# Patient Record
Sex: Female | Born: 1985 | Race: Black or African American | Hispanic: No | Smoking: Current every day smoker
Health system: Southern US, Community
[De-identification: ages and names within clinical notes are randomized; demographics above are authoritative.]

## PROBLEM LIST (undated history)

## (undated) ENCOUNTER — Ambulatory Visit (HOSPITAL_COMMUNITY): Discharge status: Against Medical Advice | Payer: Medicaid Other

## (undated) ENCOUNTER — Ambulatory Visit: Admission: EM | Payer: Self-pay

## (undated) DIAGNOSIS — R110 Nausea: Secondary | ICD-10-CM

## (undated) DIAGNOSIS — Z86718 Personal history of other venous thrombosis and embolism: Secondary | ICD-10-CM

## (undated) DIAGNOSIS — Z7901 Long term (current) use of anticoagulants: Secondary | ICD-10-CM

## (undated) DIAGNOSIS — K219 Gastro-esophageal reflux disease without esophagitis: Secondary | ICD-10-CM

## (undated) DIAGNOSIS — Z973 Presence of spectacles and contact lenses: Secondary | ICD-10-CM

## (undated) DIAGNOSIS — I82409 Acute embolism and thrombosis of unspecified deep veins of unspecified lower extremity: Secondary | ICD-10-CM

## (undated) DIAGNOSIS — N939 Abnormal uterine and vaginal bleeding, unspecified: Secondary | ICD-10-CM

## (undated) DIAGNOSIS — F32A Depression, unspecified: Secondary | ICD-10-CM

## (undated) DIAGNOSIS — Z803 Family history of malignant neoplasm of breast: Secondary | ICD-10-CM

## (undated) DIAGNOSIS — I872 Venous insufficiency (chronic) (peripheral): Secondary | ICD-10-CM

## (undated) DIAGNOSIS — F419 Anxiety disorder, unspecified: Secondary | ICD-10-CM

## (undated) DIAGNOSIS — C801 Malignant (primary) neoplasm, unspecified: Secondary | ICD-10-CM

## (undated) DIAGNOSIS — M1990 Unspecified osteoarthritis, unspecified site: Secondary | ICD-10-CM

## (undated) DIAGNOSIS — I739 Peripheral vascular disease, unspecified: Secondary | ICD-10-CM

## (undated) HISTORY — DX: Family history of malignant neoplasm of breast: Z80.3

---
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
## 2003-01-09 ENCOUNTER — Ambulatory Visit: Admission: RE | Admit: 2003-01-09 | Discharge: 2003-01-09 | Payer: Self-pay | Admitting: Obstetrics & Gynecology

## 2003-01-09 ENCOUNTER — Encounter: Payer: Self-pay | Admitting: Obstetrics & Gynecology

## 2003-01-09 ENCOUNTER — Ambulatory Visit (HOSPITAL_COMMUNITY): Admission: RE | Admit: 2003-01-09 | Discharge: 2003-01-09 | Payer: Self-pay | Admitting: Obstetrics & Gynecology

## 2003-02-16 ENCOUNTER — Inpatient Hospital Stay (HOSPITAL_COMMUNITY): Admission: AD | Admit: 2003-02-16 | Discharge: 2003-02-20 | Payer: Self-pay | Admitting: Obstetrics and Gynecology

## 2005-06-10 ENCOUNTER — Emergency Department (HOSPITAL_COMMUNITY): Admission: EM | Admit: 2005-06-10 | Discharge: 2005-06-10 | Payer: Self-pay | Admitting: Emergency Medicine

## 2005-06-16 ENCOUNTER — Emergency Department (HOSPITAL_COMMUNITY): Admission: EM | Admit: 2005-06-16 | Discharge: 2005-06-16 | Payer: Self-pay | Admitting: Emergency Medicine

## 2005-07-08 ENCOUNTER — Emergency Department (HOSPITAL_COMMUNITY): Admission: EM | Admit: 2005-07-08 | Discharge: 2005-07-08 | Payer: Self-pay | Admitting: Emergency Medicine

## 2005-11-24 ENCOUNTER — Emergency Department (HOSPITAL_COMMUNITY): Admission: EM | Admit: 2005-11-24 | Discharge: 2005-11-25 | Payer: Self-pay | Admitting: Emergency Medicine

## 2006-05-28 ENCOUNTER — Ambulatory Visit: Payer: Self-pay | Admitting: Oncology

## 2006-05-28 ENCOUNTER — Ambulatory Visit: Admission: RE | Admit: 2006-05-28 | Discharge: 2006-05-28 | Payer: Self-pay | Admitting: Oncology

## 2006-05-28 ENCOUNTER — Ambulatory Visit: Payer: Self-pay | Admitting: *Deleted

## 2006-05-28 LAB — CBC WITH DIFFERENTIAL/PLATELET
BASO%: 2.1 % — ABNORMAL HIGH (ref 0.0–2.0)
Basophils Absolute: 0.1 10*3/uL (ref 0.0–0.1)
EOS%: 1.6 % (ref 0.0–7.0)
HGB: 12.7 g/dL (ref 11.6–15.9)
MCH: 27 pg (ref 26.0–34.0)
MCHC: 33.7 g/dL (ref 32.0–36.0)
MONO#: 0.4 10*3/uL (ref 0.1–0.9)
RDW: 13.1 % (ref 11.3–14.5)
WBC: 7 10*3/uL (ref 3.9–10.0)
lymph#: 2 10*3/uL (ref 0.9–3.3)

## 2006-05-29 LAB — PROTHROMBIN TIME
INR: 1.1 (ref 0.0–1.5)
Prothrombin Time: 14 seconds (ref 11.6–15.2)

## 2007-07-22 ENCOUNTER — Encounter (INDEPENDENT_AMBULATORY_CARE_PROVIDER_SITE_OTHER): Payer: Self-pay | Admitting: Emergency Medicine

## 2007-07-22 ENCOUNTER — Ambulatory Visit: Payer: Self-pay | Admitting: Surgery

## 2007-07-22 ENCOUNTER — Observation Stay (HOSPITAL_COMMUNITY): Admission: EM | Admit: 2007-07-22 | Discharge: 2007-07-24 | Payer: Self-pay | Admitting: Emergency Medicine

## 2007-07-27 ENCOUNTER — Emergency Department (HOSPITAL_COMMUNITY): Admission: EM | Admit: 2007-07-27 | Discharge: 2007-07-27 | Payer: Self-pay | Admitting: Emergency Medicine

## 2007-08-26 ENCOUNTER — Ambulatory Visit: Payer: Self-pay | Admitting: Oncology

## 2007-09-27 LAB — CBC WITH DIFFERENTIAL/PLATELET
Eosinophils Absolute: 0 10*3/uL (ref 0.0–0.5)
MCV: 81.1 fL (ref 81.0–101.0)
MONO%: 4.9 % (ref 0.0–13.0)
NEUT#: 5.1 10*3/uL (ref 1.5–6.5)
RBC: 4.52 10*6/uL (ref 3.70–5.32)
RDW: 14.6 % — ABNORMAL HIGH (ref 11.3–14.5)
WBC: 7.1 10*3/uL (ref 3.9–10.0)
lymph#: 1.5 10*3/uL (ref 0.9–3.3)

## 2007-09-27 LAB — D-DIMER, QUANTITATIVE: D-Dimer, Quant: 0.78 ug/mL-FEU — ABNORMAL HIGH (ref 0.00–0.48)

## 2007-09-27 LAB — HEPARIN ANTI-XA: Heparin LMW: 0.59 IU/mL

## 2007-11-13 ENCOUNTER — Emergency Department (HOSPITAL_COMMUNITY): Admission: EM | Admit: 2007-11-13 | Discharge: 2007-11-13 | Payer: Self-pay | Admitting: Emergency Medicine

## 2007-11-16 ENCOUNTER — Ambulatory Visit: Payer: Self-pay | Admitting: Oncology

## 2008-03-01 ENCOUNTER — Ambulatory Visit: Payer: Self-pay | Admitting: Obstetrics and Gynecology

## 2008-03-01 ENCOUNTER — Inpatient Hospital Stay (HOSPITAL_COMMUNITY): Admission: AD | Admit: 2008-03-01 | Discharge: 2008-03-03 | Payer: Self-pay | Admitting: Obstetrics & Gynecology

## 2008-03-04 ENCOUNTER — Ambulatory Visit: Payer: Self-pay | Admitting: Oncology

## 2008-03-04 LAB — PROTIME-INR
INR: 1.1 — ABNORMAL LOW (ref 2.00–3.50)
Protime: 13.2 Seconds (ref 10.6–13.4)

## 2008-03-04 LAB — CBC WITH DIFFERENTIAL/PLATELET
Basophils Absolute: 0 10*3/uL (ref 0.0–0.1)
EOS%: 0.9 % (ref 0.0–7.0)
Eosinophils Absolute: 0.1 10*3/uL (ref 0.0–0.5)
HGB: 9.8 g/dL — ABNORMAL LOW (ref 11.6–15.9)
MCH: 27.7 pg (ref 26.0–34.0)
MCV: 81.3 fL (ref 81.0–101.0)
MONO%: 5.4 % (ref 0.0–13.0)
NEUT#: 7.8 10*3/uL — ABNORMAL HIGH (ref 1.5–6.5)
RBC: 3.55 10*6/uL — ABNORMAL LOW (ref 3.70–5.32)
RDW: 15.1 % — ABNORMAL HIGH (ref 11.3–14.5)
lymph#: 1 10*3/uL (ref 0.9–3.3)

## 2008-03-09 LAB — PROTIME-INR
INR: 1.8 — ABNORMAL LOW (ref 2.00–3.50)
Protime: 21.6 Seconds — ABNORMAL HIGH (ref 10.6–13.4)

## 2008-03-16 LAB — CBC WITH DIFFERENTIAL/PLATELET
BASO%: 0.9 % (ref 0.0–2.0)
Basophils Absolute: 0 10*3/uL (ref 0.0–0.1)
EOS%: 2.5 % (ref 0.0–7.0)
MCH: 27 pg (ref 26.0–34.0)
MCHC: 33.3 g/dL (ref 32.0–36.0)
MCV: 81.1 fL (ref 81.0–101.0)
MONO%: 8.3 % (ref 0.0–13.0)
RBC: 4.32 10*6/uL (ref 3.70–5.32)
RDW: 14.7 % — ABNORMAL HIGH (ref 11.3–14.5)
lymph#: 1.3 10*3/uL (ref 0.9–3.3)

## 2008-03-16 LAB — PROTIME-INR
INR: 1.9 — ABNORMAL LOW (ref 2.00–3.50)
Protime: 22.8 Seconds — ABNORMAL HIGH (ref 10.6–13.4)

## 2008-03-23 LAB — PROTIME-INR
INR: 2.3 (ref 2.00–3.50)
Protime: 27.6 Seconds — ABNORMAL HIGH (ref 10.6–13.4)

## 2008-04-13 ENCOUNTER — Ambulatory Visit: Payer: Self-pay | Admitting: Oncology

## 2008-12-11 ENCOUNTER — Ambulatory Visit: Payer: Self-pay | Admitting: Oncology

## 2008-12-29 LAB — CBC WITH DIFFERENTIAL/PLATELET
Basophils Absolute: 0 10*3/uL (ref 0.0–0.1)
Eosinophils Absolute: 0.1 10*3/uL (ref 0.0–0.5)
HGB: 11.4 g/dL — ABNORMAL LOW (ref 11.6–15.9)
MONO#: 0.2 10*3/uL (ref 0.1–0.9)
NEUT#: 1.4 10*3/uL — ABNORMAL LOW (ref 1.5–6.5)
RBC: 4.65 10*6/uL (ref 3.70–5.45)
RDW: 17.7 % — ABNORMAL HIGH (ref 11.2–14.5)
WBC: 3.5 10*3/uL — ABNORMAL LOW (ref 3.9–10.3)
lymph#: 1.7 10*3/uL (ref 0.9–3.3)

## 2009-01-01 LAB — PROTEIN S, ANTIGEN, FREE: Protein S Ag, Free: 68 % normal (ref 50–147)

## 2009-01-01 LAB — LUPUS ANTICOAGULANT PANEL
DRVVT: 35.3 secs (ref 34.7–40.5)
PTT Lupus Anticoagulant: 35.2 secs (ref 32.0–43.4)

## 2009-01-01 LAB — PROTEIN C ACTIVITY: Protein C Activity: 119 % (ref 75–133)

## 2009-04-23 ENCOUNTER — Ambulatory Visit: Payer: Self-pay | Admitting: Oncology

## 2009-05-28 ENCOUNTER — Emergency Department (HOSPITAL_COMMUNITY): Admission: EM | Admit: 2009-05-28 | Discharge: 2009-05-28 | Payer: Self-pay | Admitting: Emergency Medicine

## 2010-06-15 ENCOUNTER — Emergency Department (HOSPITAL_COMMUNITY)
Admission: EM | Admit: 2010-06-15 | Discharge: 2010-06-16 | Disposition: A | Discharge status: Home / Self Care | Payer: Medicaid Other | Attending: Emergency Medicine | Admitting: Emergency Medicine

## 2010-06-15 DIAGNOSIS — R52 Pain, unspecified: Secondary | ICD-10-CM | POA: Insufficient documentation

## 2010-06-15 DIAGNOSIS — Z5320 Procedure and treatment not carried out because of patient's decision for unspecified reasons: Secondary | ICD-10-CM | POA: Insufficient documentation

## 2010-07-05 ENCOUNTER — Emergency Department (HOSPITAL_COMMUNITY): Payer: Medicaid Other

## 2010-07-05 ENCOUNTER — Emergency Department (HOSPITAL_COMMUNITY)
Admission: EM | Admit: 2010-07-05 | Discharge: 2010-07-05 | Disposition: A | Discharge status: Home / Self Care | Payer: Medicaid Other | Attending: Emergency Medicine | Admitting: Emergency Medicine

## 2010-07-05 DIAGNOSIS — M25469 Effusion, unspecified knee: Secondary | ICD-10-CM | POA: Insufficient documentation

## 2010-07-05 DIAGNOSIS — W1789XA Other fall from one level to another, initial encounter: Secondary | ICD-10-CM | POA: Insufficient documentation

## 2010-07-05 DIAGNOSIS — M25569 Pain in unspecified knee: Secondary | ICD-10-CM | POA: Insufficient documentation

## 2010-07-05 DIAGNOSIS — Z86718 Personal history of other venous thrombosis and embolism: Secondary | ICD-10-CM | POA: Insufficient documentation

## 2010-07-07 LAB — RAPID STREP SCREEN (MED CTR MEBANE ONLY): Streptococcus, Group A Screen (Direct): NEGATIVE

## 2010-08-10 ENCOUNTER — Emergency Department (HOSPITAL_COMMUNITY)
Admission: EM | Admit: 2010-08-10 | Discharge: 2010-08-11 | Disposition: A | Discharge status: Home / Self Care | Payer: Medicaid Other | Attending: Emergency Medicine | Admitting: Emergency Medicine

## 2010-08-10 DIAGNOSIS — R079 Chest pain, unspecified: Secondary | ICD-10-CM | POA: Insufficient documentation

## 2010-08-10 DIAGNOSIS — Z86718 Personal history of other venous thrombosis and embolism: Secondary | ICD-10-CM | POA: Insufficient documentation

## 2010-08-10 DIAGNOSIS — Y92410 Unspecified street and highway as the place of occurrence of the external cause: Secondary | ICD-10-CM | POA: Insufficient documentation

## 2010-08-10 DIAGNOSIS — S139XXA Sprain of joints and ligaments of unspecified parts of neck, initial encounter: Secondary | ICD-10-CM | POA: Insufficient documentation

## 2010-08-10 DIAGNOSIS — T148XXA Other injury of unspecified body region, initial encounter: Secondary | ICD-10-CM | POA: Insufficient documentation

## 2010-08-11 ENCOUNTER — Emergency Department (HOSPITAL_COMMUNITY): Payer: Medicaid Other

## 2010-08-30 NOTE — Discharge Summary (Signed)
NAMECHARRIE, Monica Holland              ACCOUNT NO.:  [id number]   MEDICAL RECORD NO.:  [id number]          PATIENT TYPE:  OBV   LOCATION:  5121                         FACILITY:  [facility]   PHYSICIAN:  Herbie Saxon, MDDATE OF BIRTH:  December 14, 1985   DATE OF ADMISSION:  07/22/2007  DATE OF DISCHARGE:  07/24/2007                               DISCHARGE SUMMARY   DISCHARGE DIAGNOSES:  1. Deep vein thrombosis, left leg.  2. Pregnancy, first trimester.   RADIOLOGY:  Venous Doppler of the left leg on July 22, 2007, which  showed evidence of DVT in the calf vein extending to the popliteal vein  and femoral vein.   HOSPITAL COURSE:  This is a 25 year old African American lady with 2  previous deliveries.  She had DVT in the first trimester of her last  pregnancy, presented to the emergency room complaining of swelling and  pain in the left calf.  The patient had a 5-hour trip by bus from [place] prior to this.  The patient has also tobacco abuse.  At  presently, she was found to be pregnant in first trimester 5-[redacted] weeks  gestation.  The patient has been started on Lovenox.  She has no medical  insurance, and she has been set up to follow up at [facility].  The  patient's medications might be procuring an abortion; however for now,  she is started on Lovenox for possible Coumadin treatment, if she is  going with an abortion.  This is to be followed up [place] by the outpatient  physician at [facility].   DISCHARGE CONDITION:  Stable.   DISPOSITION:  Home with [facility] to monitor compliance with  clinic visit and anticoagulant treatment.  The patient to report to  emergency room if any bleeding episodes.  Followup with [facility] in  the next 3-5 days.  Followup with OB/GYN in next 1-2 weeks, and possibly  repeat left leg venous Doppler in next 5-6 weeks.   PHYSICAL EXAMINATION:  She is a young lady, obese, not in acute  respiratory distress.  Temperature 98, pulse 74,  respiratory rate is 18,  and blood pressure 180/70.  Pupils equal and reactive to light and  accommodation.  Oropharynx and nasopharynx were clear.  Head is  atraumatic, normocephalic.  There is no submandibular lymphadenopathy.  Neck is supple.  Heart sounds S1 and S2.  Regular rate and rhythm.  No  murmurs.  Chest is clear clinically.  Abdomen is benign.  She is alert  and oriented to time, place, and person.  Peripheral pulse is present.  No pedal edema.  Homans sign negative.   Homocysteine level 3.5.  Chemistry shows the sodium of 136, potassium  3.7, chloride 105, bicarbonate 24, glucose 95, BUN 3, and creatinine  0.6.  WBC 7, hematocrit 40, and platelet count 197.  D-dimer 19.  Total  cholesterol 95, HDL 32, LDL 53, and TSH of 3.19.   Need for compliance with clinical medications explained.  The patient  verbalizes understanding.      Herbie Saxon, MD  Electronically Signed  MIO/MEDQ  D:  07/24/2007  T:  07/25/2007  Job:  [id number]

## 2010-08-30 NOTE — H&P (Signed)
Monica Holland, Monica Holland              ACCOUNT NO.:  [id number]   MEDICAL RECORD NO.:  [id number]          PATIENT TYPE:  EMS   LOCATION:  MAJO                         FACILITY:  [facility]   PHYSICIAN:  Beckey Rutter, MD  DATE OF BIRTH:  [birth year]   DATE OF ADMISSION:  07/22/2007  DATE OF DISCHARGE:                              HISTORY & PHYSICAL   PRIMARY CARE PHYSICIAN:  Unassigned.   CHIEF COMPLAINT:  Leg swelling.   HISTORY OF PRESENT ILLNESS:  This is a 25 year old African American  female with past medical history significant for DVT came in today  because of left calf swelling.  The patient noticed the swelling after  she finished a trip from [place] by bus about 4 to 5 hours  driving.  The patient had pain in the calf area, but she denied fevers.  The patient had similar swelling 2 years ago during her pregnancy, was  diagnosed as deep vein thrombosis and she was kept on anticoagulation  for some time.  She was told by a physician last year that the clot was  gone after Doppler was done to verify the status of that clot.  Currently she is not sure if she is pregnant, but she denied  contraceptive pill.  The patient was a smoker up to a few days ago when  she stated she quit smoking.   PAST MEDICAL HISTORY:  Significant for deep vein thrombosis about 2  years ago.   SOCIAL HISTORY:  Smoker up to 1 week ago.  Lives with her mother.  Has  two kids.  No drug abuse.  Occasional drinker.   FAMILY HISTORY:  Noncontributory.   ALLERGIES:  Not known to have medication allergies.   MEDICATION:  1. Motrin p.r.n.  2. Tylenol p.r.n.   REVIEW OF SYSTEMS:  A 12 point review of systems is noncontributory.   PHYSICAL EXAMINATION:  VITAL SIGNS:  Temperature is 98.0, blood pressure  116/74, pulse 80, respiratory rate is 22.  HEENT:  Head atraumatic, normocephalic.  Eyes PERRL.  Mouth moist.  No  ulcer.  NECK:  Supple.  No JVD.  PRECORDIUM:  First and second heart sound  audible.  No added sounds.  LUNGS:  Bilateral fair air entry.  ABDOMEN:  Soft, nontender.  Bowel sounds present.  EXTREMITIES: Left calf swelling and warm to palpation.  The left calf is  also tender to palpation with shiny skin in that area.  NEUROLOGICALLY:  Alert and oriented x3.  Moving all her extremities  spontaneously.   LABORATORY DATA:  D-dimer is 19.4.  PTT 28.  PT is 1.1.  Sodium is 139,  potassium 3.8, chloride 104, glucose 100, BUN 6, creatinine 0.9.  White  blood count 7.9, hemoglobin is 14.1, hematocrit is 40.0, platelet count  is 197.   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  The DVT seems to be provoked by the long travel.  Nevertheless, I will send for hypercoagulable state because of the fact  that the patient had it twice.  Will check her pregnancy status  at this  time as well.  The patient will be started on Lovenox and we will switch  to Coumadin after the result of pregnancy test.  For GI prophylaxis I  will start Protonix.      Beckey Rutter, MD  Electronically Signed     EME/MEDQ  D:  07/22/2007  T:  07/23/2007  Job:  [id number]

## 2010-12-12 ENCOUNTER — Emergency Department (HOSPITAL_COMMUNITY)
Admission: EM | Admit: 2010-12-12 | Discharge: 2010-12-12 | Disposition: A | Discharge status: Home / Self Care | Payer: Medicaid Other | Attending: Emergency Medicine | Admitting: Emergency Medicine

## 2010-12-12 DIAGNOSIS — R220 Localized swelling, mass and lump, head: Secondary | ICD-10-CM | POA: Insufficient documentation

## 2010-12-12 DIAGNOSIS — W2203XA Walked into furniture, initial encounter: Secondary | ICD-10-CM | POA: Insufficient documentation

## 2010-12-12 DIAGNOSIS — R221 Localized swelling, mass and lump, neck: Secondary | ICD-10-CM | POA: Insufficient documentation

## 2010-12-12 DIAGNOSIS — S0003XA Contusion of scalp, initial encounter: Secondary | ICD-10-CM | POA: Insufficient documentation

## 2011-01-10 LAB — BETA-2-GLYCOPROTEIN I ABS, IGG/M/A
Beta-2 Glyco I IgG: 5 U/mL (ref <20)
Beta-2-Glycoprotein I IgM: <4 U/mL (ref <10)

## 2011-01-10 LAB — POCT I-STAT, CHEM 8
BUN: 4 — ABNORMAL LOW
Creatinine, Ser: 0.8
Creatinine, Ser: 0.9
Glucose, Bld: 100 — ABNORMAL HIGH
HCT: 42
Hemoglobin: 14.3
Potassium: 4
Sodium: 136
TCO2: 25

## 2011-01-10 LAB — URINALYSIS, ROUTINE W REFLEX MICROSCOPIC
Bilirubin Urine: NEGATIVE
Nitrite: NEGATIVE
Specific Gravity, Urine: 1.023
Urobilinogen, UA: 1

## 2011-01-10 LAB — LUPUS ANTICOAGULANT PANEL
DRVVT: 50.9 — ABNORMAL HIGH (ref 36.1–47.0)
Lupus Anticoagulant: NOT DETECTED
PTTLA 4:1 Mix: 54.9 — ABNORMAL HIGH (ref 36.3–48.8)
PTTLA Confirmation: 0 (ref <8.0)
dRVVT Incubated 1:1 Mix: 41.1 (ref 36.1–47.0)

## 2011-01-10 LAB — CBC
Hemoglobin: 13
MCV: 82.4
Platelets: 197
RBC: 4.69
RDW: 13.6
WBC: 5.9

## 2011-01-10 LAB — LIPID PANEL
Cholesterol: 95
HDL: 32 — ABNORMAL LOW
LDL Cholesterol: 53
Total CHOL/HDL Ratio: 3
Triglycerides: 49

## 2011-01-10 LAB — WET PREP, GENITAL

## 2011-01-10 LAB — DIFFERENTIAL
Basophils Absolute: 0
Lymphocytes Relative: 19
Lymphs Abs: 1.3
Monocytes Relative: 6
Neutro Abs: 4.1
Neutro Abs: 5.9
Neutrophils Relative %: 70

## 2011-01-10 LAB — COMPREHENSIVE METABOLIC PANEL
ALT: 20
AST: 16
Alkaline Phosphatase: 49
Calcium: 8.8
GFR calc Af Amer: >60
Glucose, Bld: 95
Potassium: 3.7
Sodium: 136
Total Protein: 5.7 — ABNORMAL LOW

## 2011-01-10 LAB — POCT PREGNANCY, URINE
Operator id: 26520
Preg Test, Ur: POSITIVE

## 2011-01-10 LAB — CARDIOLIPIN ANTIBODIES, IGG, IGM, IGA
Anticardiolipin IgG: <7 — ABNORMAL LOW (ref <11)
Anticardiolipin IgM: <7 — ABNORMAL LOW (ref <10)

## 2011-01-10 LAB — APTT: aPTT: 28

## 2011-01-10 LAB — URINE MICROSCOPIC-ADD ON

## 2011-01-10 LAB — RAPID URINE DRUG SCREEN, HOSP PERFORMED
Amphetamines: NOT DETECTED
Benzodiazepines: NOT DETECTED

## 2011-01-10 LAB — TSH: TSH: 3.191

## 2011-01-10 LAB — HCG, QUANTITATIVE, PREGNANCY: hCG, Beta Chain, Quant, S: 59778 — ABNORMAL HIGH

## 2011-01-17 LAB — STREP B DNA PROBE

## 2011-01-17 LAB — CBC
HCT: 27.7 — ABNORMAL LOW
Hemoglobin: 11.2 — ABNORMAL LOW
Hemoglobin: 9.4 — ABNORMAL LOW
RBC: 3.3 — ABNORMAL LOW
RBC: 3.99
RDW: 14.9
WBC: 11.7 — ABNORMAL HIGH

## 2011-01-17 LAB — RAPID URINE DRUG SCREEN, HOSP PERFORMED
Amphetamines: NOT DETECTED
Barbiturates: NOT DETECTED
Opiates: NOT DETECTED

## 2011-01-17 LAB — URINALYSIS, ROUTINE W REFLEX MICROSCOPIC
Bilirubin Urine: NEGATIVE
Ketones, ur: NEGATIVE
Nitrite: NEGATIVE
pH: 7

## 2011-01-17 LAB — APTT: aPTT: 31

## 2011-01-17 LAB — RUBELLA SCREEN: Rubella: 28.5 — ABNORMAL HIGH

## 2011-01-17 LAB — URINE MICROSCOPIC-ADD ON

## 2011-01-17 LAB — ABO/RH: ABO/RH(D): B POS

## 2011-01-17 LAB — TYPE AND SCREEN: ABO/RH(D): B POS

## 2011-01-17 LAB — PROTIME-INR
INR: 1.2
Prothrombin Time: 14.7
Prothrombin Time: 16 — ABNORMAL HIGH

## 2011-01-17 LAB — CREATININE, SERUM: GFR calc non Af Amer: >60

## 2011-01-17 LAB — WET PREP, GENITAL: Clue Cells Wet Prep HPF POC: NONE SEEN

## 2011-01-17 LAB — RPR: RPR Ser Ql: NONREACTIVE

## 2012-04-17 DIAGNOSIS — Z86711 Personal history of pulmonary embolism: Secondary | ICD-10-CM

## 2012-04-17 HISTORY — PX: ANKLE SURGERY: SHX546

## 2012-04-17 HISTORY — DX: Personal history of pulmonary embolism: Z86.711

## 2012-10-20 ENCOUNTER — Encounter (HOSPITAL_COMMUNITY): Payer: Self-pay | Admitting: *Deleted

## 2012-10-20 ENCOUNTER — Emergency Department (HOSPITAL_COMMUNITY)
Admission: EM | Admit: 2012-10-20 | Discharge: 2012-10-21 | Disposition: A | Discharge status: Home / Self Care | Payer: Medicaid Other | Attending: Emergency Medicine | Admitting: Emergency Medicine

## 2012-10-20 DIAGNOSIS — I82402 Acute embolism and thrombosis of unspecified deep veins of left lower extremity: Secondary | ICD-10-CM

## 2012-10-20 DIAGNOSIS — F17200 Nicotine dependence, unspecified, uncomplicated: Secondary | ICD-10-CM | POA: Insufficient documentation

## 2012-10-20 DIAGNOSIS — I82409 Acute embolism and thrombosis of unspecified deep veins of unspecified lower extremity: Secondary | ICD-10-CM | POA: Insufficient documentation

## 2012-10-20 DIAGNOSIS — I739 Peripheral vascular disease, unspecified: Secondary | ICD-10-CM | POA: Insufficient documentation

## 2012-10-20 HISTORY — DX: Acute embolism and thrombosis of unspecified deep veins of unspecified lower extremity: I82.409

## 2012-10-20 MED ORDER — OXYCODONE-ACETAMINOPHEN 5-325 MG PO TABS
1.0000 | ORAL_TABLET | Freq: Once | ORAL | Status: AC
  Administered 2012-10-20: 1 via ORAL
  Filled 2012-10-20: qty 1

## 2012-10-20 MED ORDER — ENOXAPARIN SODIUM 80 MG/0.8ML ~~LOC~~ SOLN
75.0000 mg | Freq: Once | SUBCUTANEOUS | Status: AC
  Administered 2012-10-20: 75 mg via SUBCUTANEOUS
  Filled 2012-10-20: qty 0.8

## 2012-10-20 NOTE — ED Notes (Signed)
Pt states she has extensive history of DVT. Pt has not been anticoagulated x 6 months w/ known DVT. Pt states worsening sxs of swelling and pain x 2 weeks. Pt denies CP and SoB at this time. Pt is in no acute distress.

## 2012-10-20 NOTE — ED Notes (Signed)
Patient is alert and oriented x3.  She is complaining of left leg pain due to swelling.  Patient has a history of DVTs and has not been seeing any PCP for this due to her moving from [facility] to [place].

## 2012-10-20 NOTE — ED Provider Notes (Signed)
   History    CSN: [id number] Arrival date & time 10/20/12  2100  First MD Initiated Contact with Patient 10/20/12 2227     Chief Complaint  Patient presents with  . Leg Swelling  . Claudication   (Consider location/radiation/quality/duration/timing/severity/associated sxs/prior Treatment) The history is provided by the patient.  Para Monica Holland is a 27 y.o. female hx of DVT uncompliant with coumadin here with L leg swelling. The swelling over the last 2 weeks. She came here from [place] 2 months ago and hasn't seen a doctor yet. She decided to stop taking her Coumadin 6 months ago. Never has history of PE and denies any chest pain or shortness of breath.    Past Medical History  Diagnosis Date  . DVT (deep venous thrombosis)    History reviewed. No pertinent past surgical history. History reviewed. No pertinent family history. History  Substance Use Topics  . Smoking status: Current Every Day Smoker    Types: Cigarettes  . Smokeless tobacco: Not on file  . Alcohol Use: Yes     Comment: weekends (a lot)   OB History   Grav Para Term Preterm Abortions TAB SAB Ect Mult Living                 Review of Systems  Cardiovascular: Positive for leg swelling.  All other systems reviewed and are negative.    Allergies  Review of patient's allergies indicates no known allergies.  Home Medications  No current outpatient prescriptions on file. BP 122/74  Pulse 93  Temp(Src) 98.8 F (37.1 C) (Oral)  Resp 16  Ht 5\' 11"  (1.803 m)  Wt 165 lb (74.844 kg)  BMI 23.02 kg/m2  SpO2 100%  LMP 10/13/2012 Physical Exam  Nursing note and vitals reviewed. Constitutional: She is oriented to person, place, and time. She appears well-developed and well-nourished.  HENT:  Head: Normocephalic.  Mouth/Throat: Oropharynx is clear and moist.  Eyes: Conjunctivae are normal. Pupils are equal, round, and reactive to light.  Neck: Normal range of motion. Neck supple.  Cardiovascular:  Normal rate, regular rhythm and normal heart sounds.   Pulmonary/Chest: Effort normal and breath sounds normal.  Abdominal: Soft. Bowel sounds are normal. She exhibits no distension. There is no tenderness. There is no rebound and no guarding.  Musculoskeletal:  L leg swollen, + calf tenderness, 2+ pulses   Neurological: She is alert and oriented to person, place, and time.  Skin: Skin is warm and dry.  Psychiatric: She has a normal mood and affect. Her behavior is normal. Judgment and thought content normal.    ED Course  Procedures (including critical care time) Labs Reviewed - No data to display No results found. No diagnosis found.  MDM  Monica Holland is a 27 y.o. female here with L leg swelling with known DVT not on coumadin. She likely has another L leg DVT. No vascular US overnight. Will give pain meds and lovenox. Will d/c home on lovenox and then start coumadin in several days. She said that she can f/u with a doctor to get INR check.    Richardean Canal, MD 10/21/12 [phone]

## 2012-10-21 MED ORDER — WARFARIN SODIUM 5 MG PO TABS: 10.0000 mg | ORAL_TABLET | Freq: Every day | ORAL | Status: DC

## 2012-10-21 MED ORDER — ENOXAPARIN SODIUM 100 MG/ML ~~LOC~~ SOLN: 1.0000 mg/kg | Freq: Two times a day (BID) | SUBCUTANEOUS | Status: DC

## 2012-10-21 MED ORDER — OXYCODONE-ACETAMINOPHEN 5-325 MG PO TABS: 2.0000 | ORAL_TABLET | ORAL | Status: DC | PRN

## 2012-10-21 NOTE — ED Notes (Signed)
Patient is alert and oriented x3.  She was given DC instructions and follow up visit instructions.  Patient gave verbal understanding. She was DC ambulatory under her own power to home.  V/S stable.  He was not showing any signs of distress on DC 

## 2013-03-04 ENCOUNTER — Emergency Department (HOSPITAL_COMMUNITY)
Admission: EM | Admit: 2013-03-04 | Discharge: 2013-03-05 | Disposition: A | Discharge status: Home / Self Care | Payer: Medicaid Other | Attending: Emergency Medicine | Admitting: Emergency Medicine

## 2013-03-04 ENCOUNTER — Encounter (HOSPITAL_COMMUNITY): Payer: Self-pay | Admitting: Emergency Medicine

## 2013-03-04 DIAGNOSIS — F17200 Nicotine dependence, unspecified, uncomplicated: Secondary | ICD-10-CM | POA: Insufficient documentation

## 2013-03-04 DIAGNOSIS — R0602 Shortness of breath: Secondary | ICD-10-CM | POA: Insufficient documentation

## 2013-03-04 DIAGNOSIS — Z9119 Patient's noncompliance with other medical treatment and regimen: Secondary | ICD-10-CM | POA: Insufficient documentation

## 2013-03-04 DIAGNOSIS — Z9114 Patient's other noncompliance with medication regimen: Secondary | ICD-10-CM

## 2013-03-04 DIAGNOSIS — I2699 Other pulmonary embolism without acute cor pulmonale: Secondary | ICD-10-CM | POA: Insufficient documentation

## 2013-03-04 DIAGNOSIS — I82402 Acute embolism and thrombosis of unspecified deep veins of left lower extremity: Secondary | ICD-10-CM

## 2013-03-04 DIAGNOSIS — Z3202 Encounter for pregnancy test, result negative: Secondary | ICD-10-CM | POA: Insufficient documentation

## 2013-03-04 DIAGNOSIS — Z7901 Long term (current) use of anticoagulants: Secondary | ICD-10-CM | POA: Insufficient documentation

## 2013-03-04 DIAGNOSIS — I82409 Acute embolism and thrombosis of unspecified deep veins of unspecified lower extremity: Secondary | ICD-10-CM | POA: Insufficient documentation

## 2013-03-04 DIAGNOSIS — Z91199 Patient's noncompliance with other medical treatment and regimen due to unspecified reason: Secondary | ICD-10-CM | POA: Insufficient documentation

## 2013-03-04 NOTE — ED Notes (Signed)
Nanavati, MD at bedside. 

## 2013-03-04 NOTE — ED Notes (Signed)
Pt states that she has a hx of blood clots, most recently in July. Pt admits that the reason she keeps getting blood clots is due to non-compliance with medication regimen.

## 2013-03-04 NOTE — ED Notes (Addendum)
Pt. reports left lower leg pain with mild swelling for several days , pt. stated history of DVT , denies injury , ambulatory , respirations unlabored .

## 2013-03-05 ENCOUNTER — Emergency Department (HOSPITAL_COMMUNITY): Payer: Medicaid Other

## 2013-03-05 ENCOUNTER — Encounter (HOSPITAL_COMMUNITY): Payer: Self-pay | Admitting: Radiology

## 2013-03-05 LAB — POCT I-STAT, CHEM 8
HCT: 43 % (ref 36.0–46.0)
Hemoglobin: 14.6 g/dL (ref 12.0–15.0)
Sodium: 143 mEq/L (ref 135–145)
TCO2: 24 mmol/L (ref 0–100)

## 2013-03-05 LAB — POCT PREGNANCY, URINE: Preg Test, Ur: NEGATIVE

## 2013-03-05 MED ORDER — RIVAROXABAN (XARELTO) EDUCATION KIT FOR DVT/PE PATIENTS
PACK | Freq: Once | Status: AC
  Administered 2013-03-05: 04:00:00
  Filled 2013-03-05: qty 1

## 2013-03-05 MED ORDER — IOHEXOL 350 MG/ML SOLN
100.0000 mL | Freq: Once | INTRAVENOUS | Status: AC | PRN
  Administered 2013-03-05: 100 mL via INTRAVENOUS

## 2013-03-05 MED ORDER — ACETAMINOPHEN-CODEINE #3 300-30 MG PO TABS: 1.0000 | ORAL_TABLET | Freq: Four times a day (QID) | ORAL | Status: DC | PRN

## 2013-03-05 MED ORDER — RIVAROXABAN 15 MG PO TABS
15.0000 mg | ORAL_TABLET | Freq: Two times a day (BID) | ORAL | Status: DC
  Administered 2013-03-05: 15 mg via ORAL
  Filled 2013-03-05: qty 1

## 2013-03-05 MED ORDER — RIVAROXABAN (XARELTO) VTE STARTER PACK (15 & 20 MG): ORAL_TABLET | ORAL | Status: DC

## 2013-03-05 MED ORDER — SODIUM CHLORIDE 0.9 % IV BOLUS (SEPSIS)
1000.0000 mL | Freq: Once | INTRAVENOUS | Status: AC
  Administered 2013-03-05: 1000 mL via INTRAVENOUS

## 2013-03-05 MED ORDER — ENOXAPARIN SODIUM 100 MG/ML ~~LOC~~ SOLN: 0.5000 mg/kg | Freq: Once | SUBCUTANEOUS | Status: DC

## 2013-03-05 NOTE — ED Provider Notes (Addendum)
CSN: [id number]     Arrival date & time 03/04/13  2316 History   First MD Initiated Contact with Patient 03/04/13 2354     Chief Complaint  Patient presents with  . Leg Pain   (Consider location/radiation/quality/duration/timing/severity/associated sxs/prior Treatment) HPI Comments: Pt comes in with cc of leg pain, left sided. Pt has hx of DVT, and admits to non compliance. States last took Lovenox in August. Pt has increased pain, and some swelling to the LLE. Also states that she gets short of breath now, with just a little exertion. No orthopnea, no PND, no PE hx and never checked for that.   Patient is a 27 y.o. female presenting with leg pain. The history is provided by the patient.  Leg Pain Associated symptoms: no neck pain     Past Medical History  Diagnosis Date  . DVT (deep venous thrombosis)    History reviewed. No pertinent past surgical history. No family history on file. History  Substance Use Topics  . Smoking status: Current Every Day Smoker    Types: Cigarettes  . Smokeless tobacco: Not on file  . Alcohol Use: Yes     Comment: weekends (a lot)   OB History   Grav Para Term Preterm Abortions TAB SAB Ect Mult Living                 Review of Systems  Constitutional: Positive for activity change.  Respiratory: Positive for shortness of breath.   Cardiovascular: Negative for chest pain.  Gastrointestinal: Negative for nausea, vomiting and abdominal pain.  Genitourinary: Negative for dysuria.  Musculoskeletal: Negative for neck pain.  Neurological: Negative for headaches.  Hematological: Does not bruise/bleed easily.    Allergies  Review of patient's allergies indicates no known allergies.  Home Medications   Current Outpatient Rx  Name  Route  Sig  Dispense  Refill  . enoxaparin (LOVENOX) 100 MG/ML injection   Subcutaneous   Inject 0.75 mLs (75 mg total) into the skin every 12 (twelve) hours.   20 mL   0   . oxyCODONE-acetaminophen (PERCOCET)  5-325 MG per tablet   Oral   Take 2 tablets by mouth every 4 (four) hours as needed for pain.   10 tablet   0   . warfarin (COUMADIN) 5 MG tablet   Oral   Take 2 tablets (10 mg total) by mouth daily. 10mg  once daily x 3 days then per your doctor   20 tablet   0     Dispense as written.    BP 130/78  Pulse 93  Temp(Src) 98.1 F (36.7 C) (Oral)  Resp 20  Wt 168 lb 2 oz (76.261 kg)  SpO2 100%  LMP 02/09/2013 Physical Exam  Nursing note and vitals reviewed. Constitutional: She is oriented to person, place, and time. She appears well-developed and well-nourished.  HENT:  Head: Normocephalic and atraumatic.  Eyes: EOM are normal. Pupils are equal, round, and reactive to light.  Neck: Neck supple. No JVD present.  Cardiovascular: Normal rate, regular rhythm and normal heart sounds.   No murmur heard. Pulmonary/Chest: Effort normal. No respiratory distress.  Abdominal: Soft. She exhibits no distension. There is no tenderness. There is no rebound and no guarding.  Musculoskeletal: She exhibits edema and tenderness.  LLE edema and tenderness  Neurological: She is alert and oriented to person, place, and time.  Skin: Skin is warm and dry.    ED Course  Procedures (including critical care time) Labs Review Labs Reviewed -  No data to display Imaging Review No results found.  EKG Interpretation    Date/Time:  Wednesday March 05 2013 01:16:49 EST Ventricular Rate:  70 PR Interval:  174 QRS Duration: 108 QT Interval:  393 QTC Calculation: 424 R Axis:   93 Text Interpretation:  Sinus rhythm Borderline right axis deviation Probable left ventricular hypertrophy Confirmed by Jovanka Westgate, MD, Sergio Hobart ([id number]) on 03/05/2013 1:26:23 AM            MDM  No diagnosis found.  Pt with hx of DVT, non compliance comes in with cc of leg pain and some shortness of breath. Will have to get a CT PE, she is at moderate risk per Wells score, and not a good candidate for dimer.  If the  CT PE is negative, we will discharge her with xarelto and give her dose of Lovenox here. I dont see any utility in US duplex at this time. There is no evidence of cerulea dolens and finding a DVT would lead to the same therapy - anticoagulation.  She has medicaid, and i will give her PCP follow up. She can get outpatient study done if needed, and vascular consultation at that time if there is DVT and need for filter.  Pt explained all of this, and understands the plan, addressed all her questions.    [name], MD 03/05/13 1610  [name], MD 03/05/13 0126  3:19 AM Has a very small PE. Non obstructive. Spoke with the hospitalist on call, dr. patel and dr. Conley Rolls and consensus is that this is a treatment non compliance and not failure, and with such a small PE, there is no need for admission, or even evaluation for ivc filter. Will d.c Return precautions and need for compliance discussed. Xarelto 30 day free voucher given. Has medicaid. [facility] follow up provided.   [name], MD 03/05/13 [phone]

## 2013-03-05 NOTE — Progress Notes (Signed)
Xarelto patient education completed and provided patient with educational brochure including card for first 30 days of treatment.

## 2013-03-05 NOTE — ED Notes (Signed)
Pharmacist at bedside doing pt education on Xarelto. Pt given information packet as well.

## 2013-03-05 NOTE — ED Notes (Signed)
Spoke with pharmacist. Pharmacist states that the reason the pt's Lovenox has not been sent is because he is waiting on the verification of the dose needed.

## 2013-03-10 ENCOUNTER — Emergency Department (HOSPITAL_COMMUNITY): Payer: Medicaid Other

## 2013-03-10 ENCOUNTER — Encounter (HOSPITAL_COMMUNITY): Payer: Self-pay | Admitting: Emergency Medicine

## 2013-03-10 ENCOUNTER — Emergency Department (HOSPITAL_COMMUNITY)
Admission: EM | Admit: 2013-03-10 | Discharge: 2013-03-10 | Disposition: A | Discharge status: Home / Self Care | Payer: Medicaid Other | Attending: Emergency Medicine | Admitting: Emergency Medicine

## 2013-03-10 DIAGNOSIS — R0602 Shortness of breath: Secondary | ICD-10-CM | POA: Insufficient documentation

## 2013-03-10 DIAGNOSIS — Z86711 Personal history of pulmonary embolism: Secondary | ICD-10-CM | POA: Insufficient documentation

## 2013-03-10 DIAGNOSIS — F17200 Nicotine dependence, unspecified, uncomplicated: Secondary | ICD-10-CM | POA: Insufficient documentation

## 2013-03-10 DIAGNOSIS — Z86718 Personal history of other venous thrombosis and embolism: Secondary | ICD-10-CM | POA: Insufficient documentation

## 2013-03-10 DIAGNOSIS — R05 Cough: Secondary | ICD-10-CM | POA: Insufficient documentation

## 2013-03-10 DIAGNOSIS — Z7901 Long term (current) use of anticoagulants: Secondary | ICD-10-CM | POA: Insufficient documentation

## 2013-03-10 DIAGNOSIS — R059 Cough, unspecified: Secondary | ICD-10-CM | POA: Insufficient documentation

## 2013-03-10 LAB — CBC WITH DIFFERENTIAL/PLATELET
Basophils Absolute: 0 10*3/uL (ref 0.0–0.1)
Eosinophils Relative: 2 % (ref 0–5)
Lymphocytes Relative: 39 % (ref 12–46)
Neutro Abs: 3.5 10*3/uL (ref 1.7–7.7)
Platelets: 202 10*3/uL (ref 150–400)
RDW: 16.4 % — ABNORMAL HIGH (ref 11.5–15.5)
WBC: 6.5 10*3/uL (ref 4.0–10.5)

## 2013-03-10 LAB — BASIC METABOLIC PANEL
CO2: 25 mEq/L (ref 19–32)
Calcium: 9.2 mg/dL (ref 8.4–10.5)
Chloride: 106 mEq/L (ref 96–112)
Sodium: 139 mEq/L (ref 135–145)

## 2013-03-10 LAB — POCT I-STAT TROPONIN I: Troponin i, poc: 0 ng/mL (ref 0.00–0.08)

## 2013-03-10 NOTE — ED Notes (Signed)
Pt states she went to [facility] last week and was told she had a PE.  Pt is on xeralto at this time.  Pt states she has a hx of blood clots x 10 years.  Reports that she became more SOB last night which prompted her to represent.  Pt states she has pain in b/l rib cage radiating to back, pain is 8/10 sharp throbbing in nature.

## 2013-03-10 NOTE — ED Provider Notes (Signed)
CSN: [id number]     Arrival date & time 03/10/13  0612 History   First MD Initiated Contact with Patient 03/10/13 0615     Chief Complaint  Patient presents with  . Shortness of Breath   (Consider location/radiation/quality/duration/timing/severity/associated sxs/prior Treatment) The history is provided by the patient and medical records.   This is a 27 y.o. F with PMH significant for recurrent LLE DVT presenting to the ED for SOB.  Pt was seen in the ED on 03/05/13 and found to have a tiny, non-obstructing RLL PE without heart strain.  Pt was placed on xarelto and states she has been complaint with this medications.  Pt began having bilateral pain in her ribs and SOB which concerned her.  States she has been intermittently lightheaded over the past few days.  sto new cold sx-- non-productive cough, sore throat, chills.  No hemoptysis.  Denies any syncopal events.  Denies any fever.  VS stable on arrival-- no hypoxia, no tachycardia.  Past Medical History  Diagnosis Date  . DVT (deep venous thrombosis)    No past surgical history on file. No family history on file. History  Substance Use Topics  . Smoking status: Current Every Day Smoker    Types: Cigarettes  . Smokeless tobacco: Not on file  . Alcohol Use: Yes     Comment: weekends (a lot)   OB History   Grav Para Term Preterm Abortions TAB SAB Ect Mult Living                 Review of Systems  Respiratory: Positive for cough and shortness of breath.   Cardiovascular: Positive for chest pain.  All other systems reviewed and are negative.    Allergies  Review of patient's allergies indicates no known allergies.  Home Medications   Current Outpatient Rx  Name  Route  Sig  Dispense  Refill  . acetaminophen-codeine (TYLENOL #3) 300-30 MG per tablet   Oral   Take 1-2 tablets by mouth every 6 (six) hours as needed.   15 tablet   0   . Rivaroxaban 15 & 20 MG TBPK      Take as directed on package: Start with one 15mg   tablet by mouth twice a day with food. On Day 22, switch to one 20mg  tablet once a day with food.   51 each   0    BP 124/90  Pulse 96  Temp(Src) 98.3 F (36.8 C) (Oral)  Resp 20  Ht 5\' 11"  (1.803 m)  Wt 168 lb (76.204 kg)  BMI 23.44 kg/m2  SpO2 100%  LMP 02/09/2013  Physical Exam  Nursing note and vitals reviewed. Constitutional: She is oriented to person, place, and time. She appears well-developed and well-nourished. No distress.  HENT:  Head: Normocephalic and atraumatic.  Right Ear: Tympanic membrane and ear canal normal.  Left Ear: Tympanic membrane and ear canal normal.  Nose: Nose normal.  Mouth/Throat: Uvula is midline, oropharynx is clear and moist and mucous membranes are normal. No oropharyngeal exudate, posterior oropharyngeal edema, posterior oropharyngeal erythema or tonsillar abscesses.  Eyes: Conjunctivae and EOM are normal. Pupils are equal, round, and reactive to light.  Neck: Normal range of motion. Neck supple.  Cardiovascular: Normal rate, regular rhythm and normal heart sounds.   Pulmonary/Chest: Effort normal and breath sounds normal. No accessory muscle usage. Not tachypneic. No respiratory distress. She has no wheezes. She has no rhonchi.  Lungs CTAB  Abdominal: Soft. Bowel sounds are normal. There is  no tenderness. There is no guarding.  Musculoskeletal: Normal range of motion.  No calf asymmetry, TTP, or palpable cord; negative Homan's sign; strong distal pulse; sensation intact  Neurological: She is alert and oriented to person, place, and time.  Skin: Skin is warm and dry. She is not diaphoretic.  Psychiatric: She has a normal mood and affect.    ED Course  Procedures (including critical care time) Labs Review Labs Reviewed  CBC WITH DIFFERENTIAL - Abnormal; Notable for the following:    RBC 5.22 (*)    RDW 16.4 (*)    All other components within normal limits  BASIC METABOLIC PANEL - Abnormal; Notable for the following:    BUN 4 (*)     GFR calc non Af Amer 76 (*)    GFR calc Af Amer 88 (*)    All other components within normal limits  POCT I-STAT TROPONIN I   Imaging Review Dg Chest 2 View  03/10/2013   CLINICAL DATA:  Bilateral chest pain, cough, shortness of breath, runny nose, history of smoking  EXAM: CHEST  2 VIEW  COMPARISON:  08/11/2010; 07/27/2007; chest CT- 03/05/2013  FINDINGS: Grossly unchanged cardiac cardiac silhouette and mediastinal contours. The lungs remain hyperexpanded with mild diffuse slightly nodular thickening of the pulmonary interstitium. No new focal airspace opacities. No pleural effusion or pneumothorax. No evidence of edema. No acute osseus abnormalities.  IMPRESSION: Mild lung hyperexpansion and bronchitic change without acute cardiopulmonary disease.   Electronically Signed   By: Simonne Come M.D.   On: 03/10/2013 07:18    EKG Interpretation    Date/Time:  Monday March 10 2013 06:25:05 EST Ventricular Rate:  97 PR Interval:  179 QRS Duration: 98 QT Interval:  368 QTC Calculation: 467 R Axis:   85 Text Interpretation:  Sinus rhythm No significant change since last tracing Confirmed by KNAPP  MD-J, JON ([id number]) on 03/10/2013 6:29:49 AM            MDM   1. Shortness of breath    Patient with known small, nonobstructing pulmonary embolus in right lower lobe, currently on Xarelto and endorses compliance with his medication.  Will obtain basic work-up and reassess.  EKG NSR, no acute ischemic changes.  Trop negative.  CXR without acute disease.  Labs as above.  VS have remained stable while in the ED without noted hypoxia or tachycardia.  Intermittent lightheadedness possibly due to new cold sx.  Pt afebrile, non-toxic appearing, NAD, VS stable- ok for discharge.  Instructed to continue taking Xarelto as previously instructed.  Pt has previously scheduled FU with [facility] in early December.  Discussed plan with pt, she agreed.  Return precautions advised.  Discussed with  Dr. Lynelle Doctor who agrees with assessment and plan of care.  Garlon Hatchet, PA-C 03/10/13 [phone]

## 2013-03-10 NOTE — ED Notes (Signed)
PA at bedside.

## 2013-03-11 NOTE — ED Provider Notes (Signed)
Medical screening examination/treatment/procedure(s) were performed by non-physician practitioner and as supervising physician I was immediately available for consultation/collaboration.  EKG Interpretation    Date/Time:  Monday March 10 2013 06:25:05 EST Ventricular Rate:  97 PR Interval:  179 QRS Duration: 98 QT Interval:  368 QTC Calculation: 467 R Axis:   85 Text Interpretation:  Sinus rhythm No significant change since last tracing Confirmed by Mahealani Sulak  MD-J, Moris Ratchford ([id number]) on 03/10/2013 6:29:49 AM              Celene Kras, MD 03/11/13 [phone]

## 2013-03-28 ENCOUNTER — Ambulatory Visit: Payer: Medicaid Other | Admitting: Internal Medicine

## 2014-04-19 ENCOUNTER — Encounter (HOSPITAL_COMMUNITY): Payer: Self-pay | Admitting: Emergency Medicine

## 2014-04-19 ENCOUNTER — Emergency Department (HOSPITAL_COMMUNITY)
Admission: EM | Admit: 2014-04-19 | Discharge: 2014-04-20 | Disposition: A | Discharge status: Home / Self Care | Payer: No Typology Code available for payment source | Attending: Emergency Medicine | Admitting: Emergency Medicine

## 2014-04-19 DIAGNOSIS — S8992XA Unspecified injury of left lower leg, initial encounter: Secondary | ICD-10-CM | POA: Diagnosis not present

## 2014-04-19 DIAGNOSIS — Z7901 Long term (current) use of anticoagulants: Secondary | ICD-10-CM | POA: Diagnosis not present

## 2014-04-19 DIAGNOSIS — Y998 Other external cause status: Secondary | ICD-10-CM | POA: Diagnosis not present

## 2014-04-19 DIAGNOSIS — S3992XA Unspecified injury of lower back, initial encounter: Secondary | ICD-10-CM | POA: Diagnosis present

## 2014-04-19 DIAGNOSIS — Z760 Encounter for issue of repeat prescription: Secondary | ICD-10-CM | POA: Insufficient documentation

## 2014-04-19 DIAGNOSIS — I82512 Chronic embolism and thrombosis of left femoral vein: Secondary | ICD-10-CM | POA: Diagnosis not present

## 2014-04-19 DIAGNOSIS — Y92410 Unspecified street and highway as the place of occurrence of the external cause: Secondary | ICD-10-CM | POA: Insufficient documentation

## 2014-04-19 DIAGNOSIS — M545 Low back pain, unspecified: Secondary | ICD-10-CM

## 2014-04-19 DIAGNOSIS — Z720 Tobacco use: Secondary | ICD-10-CM | POA: Insufficient documentation

## 2014-04-19 DIAGNOSIS — R0602 Shortness of breath: Secondary | ICD-10-CM | POA: Diagnosis not present

## 2014-04-19 DIAGNOSIS — Y9389 Activity, other specified: Secondary | ICD-10-CM | POA: Diagnosis not present

## 2014-04-19 DIAGNOSIS — Z79899 Other long term (current) drug therapy: Secondary | ICD-10-CM | POA: Insufficient documentation

## 2014-04-19 LAB — PROTIME-INR
INR: 1 (ref 0.00–1.49)
PROTHROMBIN TIME: 13.3 s (ref 11.6–15.2)

## 2014-04-19 LAB — I-STAT CHEM 8, ED
BUN: 8 mg/dL (ref 6–23)
CHLORIDE: 103 meq/L (ref 96–112)
Calcium, Ion: 1.22 mmol/L (ref 1.12–1.23)
Creatinine, Ser: 0.9 mg/dL (ref 0.50–1.10)
Glucose, Bld: 122 mg/dL — ABNORMAL HIGH (ref 70–99)
HEMATOCRIT: 44 % (ref 36.0–46.0)
Hemoglobin: 15 g/dL (ref 12.0–15.0)
POTASSIUM: 4.1 mmol/L (ref 3.5–5.1)
SODIUM: 141 mmol/L (ref 135–145)
TCO2: 24 mmol/L (ref 0–100)

## 2014-04-19 MED ORDER — WARFARIN - PHYSICIAN DOSING INPATIENT: Freq: Every day | Status: DC

## 2014-04-19 MED ORDER — WARFARIN SODIUM 7.5 MG PO TABS: 7.5000 mg | ORAL_TABLET | ORAL | Status: DC

## 2014-04-19 MED ORDER — CYCLOBENZAPRINE HCL 5 MG PO TABS
5.0000 mg | ORAL_TABLET | Freq: Three times a day (TID) | ORAL | Status: DC | PRN
Start: 2014-04-19 — End: 2020-11-11

## 2014-04-19 MED ORDER — WARFARIN SODIUM 7.5 MG PO TABS
7.5000 mg | ORAL_TABLET | Freq: Once | ORAL | Status: AC
  Administered 2014-04-20: 7.5 mg via ORAL
  Filled 2014-04-19 (×2): qty 1

## 2014-04-19 MED ORDER — CYCLOBENZAPRINE HCL 10 MG PO TABS
5.0000 mg | ORAL_TABLET | Freq: Once | ORAL | Status: AC
  Administered 2014-04-19: 5 mg via ORAL
  Filled 2014-04-19: qty 1

## 2014-04-19 NOTE — Discharge Instructions (Signed)
°Emergency Department Resource Guide °1) Find a Doctor and Pay Out of Pocket °Although you won't have to find out who is covered by your insurance plan, it is a good idea to ask around and get recommendations. You will then need to call the office and see if the doctor you have chosen will accept you as a new patient and what types of options they offer for patients who are self-pay. Some doctors offer discounts or will set up payment plans for their patients who do not have insurance, but you will need to ask so you aren't surprised when you get to your appointment. ° °2) Contact Your Local Health Department °Not all health departments have doctors that can see patients for sick visits, but many do, so it is worth a call to see if yours does. If you don't know where your local health department is, you can check in your phone book. The CDC also has a tool to help you locate your state's health department, and many state websites also have listings of all of their local health departments. ° °3) Find a Walk-in Clinic °If your illness is not likely to be very severe or complicated, you may want to try a walk in clinic. These are popping up all over the country in pharmacies, drugstores, and shopping centers. They're usually staffed by nurse practitioners or physician assistants that have been trained to treat common illnesses and complaints. They're usually fairly quick and inexpensive. However, if you have serious medical issues or chronic medical problems, these are probably not your best option. ° °No Primary Care Doctor: °- Call [facility] at  832-8000 - they can help you locate a primary care doctor that  accepts your insurance, provides certain services, etc. °- Physician Referral Service- 1-800-533-3463 ° °Chronic Pain Problems: °Organization         Address  Phone   Notes  °[facility]  (336) 297-2271 Patients need to be referred by their primary care doctor.  ° °Medication  Assistance: °Organization         Address  Phone   Notes  °[place] [facility] [address] °[place], [place] [address] (336) 641-8030 --Must be a resident of [place] °-- Must have NO insurance coverage whatsoever (no Medicaid/ Medicare, etc.) °-- The pt. MUST have a primary care doctor that directs their care regularly and follows them in the community °  °[facility]  (866) 331-1348   °[employer]  (888) 892-1162   ° °Agencies that provide inexpensive medical care: °Organization         Address  Phone   Notes  °[facility]  (336) 832-8035   °[facility]    (336) 832-7272   °[facility] [address] °[place], [place] [address] (336) 832-4777   °[facility] [address], °[place] (336) 271-4999   °[facility]    (336) 373-0678   °[facility]    (336) 272-1050   °[facility] ° [address], [place] Phone:  (336) 832-4444, Fax:  (336) 832-4440 Hours of Operation:  9 am - 6 pm, M-F.  Also accepts Medicaid/Medicare and self-pay.  °[facility] ° [address], [place] Phone: (336) 832-3150, Fax: (336) 832-3151. Hours of Operation:  8:30 am - 5:30 pm, M-F.  Also accepts Medicaid and self-pay.  °[facility] [place] [address], [place] Phone: (336) 878-6027   °[facility] [address], [place], [place] (336)723-1848, Ext. 123 Mondays & Thursdays: 7-9 AM.  First 15 patients are seen on a first come, first serve basis. °  ° °Medicaid-accepting [place] Providers: ° °Organization         Address  Phone   Notes  °[facility] [address], [place] (336) 641-2100 Also accepts self-pay patients.  °[facility] [address], [place] ° (336) 856-9996   °[facility] [address], [place]  (336) 288-8857   °[facility] [address] [place] [address], [place] (336) 299-7000   °Veita Bland [address], [place]  ° (336) 373-1557 Only accepts [place] Access Medicaid patients after they have their name applied to their card.  ° °Self-Pay (no insurance) in [place]: ° °Organization         Address  Phone   Notes  °Sickle Cell Patients, [facility] [address], [place] (336) 832-1970   °[facility] [address], [place] (336) 832-4400   °[facility] [place] ° [address], [place] (336) 992-4800   °[facility]/Dr. Osei-Bonsu ° [address] [place] [address], [place] or [address], [place] (336) 841-8500 Phone number for both [place] and [place] locations is the same.  °[facility] [address], [place] (336) 299-0000   °[facility] [address] [place] [address], [place] or [address] (336) 852-7530 °(336) 878-2260   °[facility] [address], [place] (336) 350-1642, phone; (336) 294-5005, fax Sees patients 1st and 3rd Saturday of every month.  Must not qualify for public or private insurance (i.e. Medicaid, Medicare, [place] Health Choice, Veterans' Benefits) • Household income should be no more than 200% of the poverty level •The clinic cannot treat you if you are pregnant or think you are pregnant • Sexually transmitted diseases are not treated at the clinic.  ° ° °Dental Care: °Organization         Address  Phone  Notes  °[place] [facility] [address], [place] (336) 641-6152 Accepts children up to age 21 who are enrolled in Medicaid or [place] Health Choice; pregnant women with a Medicaid card; and children who have applied for Medicaid or [place] Health Choice, but were declined, whose parents can pay a reduced fee at time of service.  °[place]  [facility] [place]  [address], [place] (336) 641-7733 Accepts children up to age 21 who are enrolled in Medicaid or [place] Health Choice; pregnant women with a Medicaid card; and children who have applied for Medicaid or [place] Health Choice, but were declined, whose parents can pay a reduced fee at time of service.  °[facility] ° [address],  (336) 641-4533 Patients are seen by appointment only. Walk-ins are not accepted. [facility] will see patients 18 years of age and older. °Monday - Tuesday (8am-5pm) °Most Wednesdays (8:30-5pm) °$30 per visit, cash only  °[facility] ° [address], [place] (336) 641-4533 Patients are seen by appointment only. Walk-ins are not accepted. [facility] will see patients 18 years of age and older. °One   Wednesday Evening (Monthly: Volunteer Based).  $30 per visit, cash only  °[facility]  (919) 537-3737 for adults; Children under age 4, call Graduate Pediatric Dentistry at (919) [phone]. Children aged 4-14, please call (919) 537-3737 to request a pediatric application. ° Dental services are provided in all areas of dental care including fillings, crowns and bridges, complete and partial dentures, implants, gum treatment, root canals, and extractions. Preventive care is also provided. Treatment is provided to both adults and children. °Patients are selected via a lottery and there is often a waiting list. °  °[facility] [address], °[place] ° (336) 763-8833 [email or web address] °  °[facility] [address], [place], [place] (336)723-1848, Ext. 123 Second and Fourth Thursday of each month, opens at 6:30 AM; Clinic ends at 9 AM.  Patients are seen on a first-come first-served basis, and a limited number are seen during each clinic.  ° °[facility] ° [address], [place], [place] (336) 723-7904    Eligibility Requirements °You must have lived in [place], [place], or [place] counties for at least the last three months. °  You cannot be eligible for state or federal sponsored healthcare insurance, including Veterans Administration, Medicaid, or Medicare. °  You generally cannot be eligible for healthcare insurance through your employer.  °  How to apply: °Eligibility screenings are held every Tuesday and Wednesday afternoon from 1:00 pm until 4:00 pm. You do not need an appointment for the interview!  °[facility] [address], [place], [place] 336-631-2330   °[facility]  336-342-8273   °[facility]  336-703-3100   °[facility]  336-570-6415   ° °Behavioral Health Resources in the Community: °Intensive Outpatient Programs °Organization         Address  Phone  Notes  °[facility] [address], [place], [place] 336-878-6098   °[facility] [address], [place], [place] 336-832-9800   °[facility] [address], [place], [place] ° 336-882-2125   °[facility] [address],  °[place], [place] 1-800-853-5163 or 336-641-4981   °Substance Abuse Resources °Organization         Address  Phone  Notes  °[facility]  336-882-2125   °[facility]  336-784-9470   °[facility]  336-285-9073   °[facility]  336-845-3988   °Residential & Outpatient Substance Abuse Program  1-800-659-3381   °Psychological Services °Organization         Address  Phone  Notes  °[facility]  336- 832-9600   °[facility]  336- 378-7881   °[facility] [address], [place] 1-800-853-5163 or 336-641-4981   ° °Mobile Crisis Teams °Organization         Address  Phone  Notes  °[facility], Mobile Crisis Care Unit  1-877-626-1772   °Assertive °[facility] ° [address]  [place], [place] 336-834-9664   °Sharon DeEsch [address] °[place] 336-554-5454   ° °Self-Help/Support Groups °Organization         Address  Phone             Notes  °[facility] [place] - variety of support groups  336- 373-1402 Call for more information  °Narcotics Anonymous (NA), [facility] [address], °[place]  2 meetings at this location  ° °  Residential Treatment Programs °Organization         Address  Phone  Notes  °[facility] [address],    °[place]  1-866-801-8205   °[facility] ° [address], [place], [place] 704-293-8524   °[facility] [address], [place] 336-845-3988 Admissions: 8am-3pm M-F  °[facility] [address]    °[place], [place] 336-841-1104   °[facility] [address], [place], [place] 336-379-7146   °[facility] [address]  °[place], [place] 336-285-9073   °[facility] - Intensive Outpatient [address], [place], [place] 336-852-3033   °[facility] [address]  °[place], [place] 1-877-615-2722 or 336-784-9470   °[facility] [address] [place], [place] 336-227-7417 Accepts Medicaid  °[facility] [address]  °[place] 1-800-659-3381 Substance Abuse/Addiction Treatment  ° °[place] Behavioral Health Resources °Organization         Address  Phone  Notes  °[facility]  (888) 581-9988   °Julie Brannon, PhD [address] [place], [place]   (336) 349-5553 or (336) 951-0000   °[facility]   [address] °[place], [place] (336) 349-4454   °[facility] [address], [place], [place] (336) 342-8316 Insurance/Medicaid/sponsorship through [facility]  °[facility] [address]                                    [place], [place] (336) 342-8316 Therapy/tele-psych/case    °[facility] [address]  ° [place], [place] (336) 349-2233    °Dr. Arfeen  (336) 349-4544   °[facility] [place]  [employer] [place] [facility] 1) [address], [place] °2) [address], [place] °3)  [address], [place] (336) 349-3220 °(336) 342-7768 ° °(336) 342-8140   °[place] [facility] (336) 342-1394 or (336) 342-3537 (After Hours)    ° ° °
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
## 2014-04-21 ENCOUNTER — Other Ambulatory Visit (HOSPITAL_COMMUNITY): Payer: Self-pay | Admitting: Emergency Medicine

## 2014-04-21 ENCOUNTER — Ambulatory Visit (HOSPITAL_COMMUNITY)
Admission: RE | Admit: 2014-04-21 | Discharge: 2014-04-21 | Disposition: A | Discharge status: Home / Self Care | Payer: Medicaid Other | Source: Ambulatory Visit | Attending: Emergency Medicine | Admitting: Emergency Medicine

## 2014-04-21 DIAGNOSIS — Z86718 Personal history of other venous thrombosis and embolism: Secondary | ICD-10-CM

## 2014-04-21 DIAGNOSIS — M25562 Pain in left knee: Secondary | ICD-10-CM

## 2014-04-21 DIAGNOSIS — Z8249 Family history of ischemic heart disease and other diseases of the circulatory system: Secondary | ICD-10-CM | POA: Insufficient documentation

## 2014-04-21 DIAGNOSIS — M79609 Pain in unspecified limb: Secondary | ICD-10-CM

## 2014-04-21 DIAGNOSIS — Z7901 Long term (current) use of anticoagulants: Secondary | ICD-10-CM | POA: Insufficient documentation

## 2014-04-21 DIAGNOSIS — Z86711 Personal history of pulmonary embolism: Secondary | ICD-10-CM | POA: Insufficient documentation

## 2014-04-21 DIAGNOSIS — R609 Edema, unspecified: Secondary | ICD-10-CM

## 2014-04-21 DIAGNOSIS — I82532 Chronic embolism and thrombosis of left popliteal vein: Secondary | ICD-10-CM | POA: Insufficient documentation

## 2014-04-21 DIAGNOSIS — I82432 Acute embolism and thrombosis of left popliteal vein: Secondary | ICD-10-CM | POA: Insufficient documentation

## 2014-04-21 DIAGNOSIS — I82512 Chronic embolism and thrombosis of left femoral vein: Secondary | ICD-10-CM | POA: Insufficient documentation

## 2014-04-21 NOTE — Progress Notes (Signed)
VASCULAR LAB PRELIMINARY  PRELIMINARY  PRELIMINARY  PRELIMINARY  Left lower extremity venous duplex completed.    Preliminary report:  Positive for acute DVT noted in the popliteal vein. Positive for chronic DVT noted in the popliteal, femoral and common femoral veins. No evidence of superficial thrombosis or Baker's cyst involving the left lower extremity.  Cristyn Crossno, RVS 04/21/2014, 11:59 AM

## 2014-04-22 ENCOUNTER — Telehealth: Payer: Self-pay | Admitting: *Deleted

## 2014-04-22 NOTE — Telephone Encounter (Signed)
[facility] because pt received two prescriptions that were not signed by provider.  NCM confirmed prescriptions were given by provider on chart for that visit.

## 2016-09-05 DIAGNOSIS — I872 Venous insufficiency (chronic) (peripheral): Secondary | ICD-10-CM | POA: Insufficient documentation

## 2018-03-28 ENCOUNTER — Emergency Department (HOSPITAL_BASED_OUTPATIENT_CLINIC_OR_DEPARTMENT_OTHER)
Admission: EM | Admit: 2018-03-28 | Discharge: 2018-03-28 | Disposition: A | Discharge status: Home / Self Care | Payer: Medicaid Other | Attending: Emergency Medicine | Admitting: Emergency Medicine

## 2018-03-28 ENCOUNTER — Encounter (HOSPITAL_BASED_OUTPATIENT_CLINIC_OR_DEPARTMENT_OTHER): Payer: Self-pay | Admitting: *Deleted

## 2018-03-28 ENCOUNTER — Other Ambulatory Visit: Payer: Self-pay

## 2018-03-28 DIAGNOSIS — R69 Illness, unspecified: Secondary | ICD-10-CM

## 2018-03-28 DIAGNOSIS — F17210 Nicotine dependence, cigarettes, uncomplicated: Secondary | ICD-10-CM | POA: Diagnosis not present

## 2018-03-28 DIAGNOSIS — J111 Influenza due to unidentified influenza virus with other respiratory manifestations: Secondary | ICD-10-CM | POA: Diagnosis not present

## 2018-03-28 DIAGNOSIS — Z7901 Long term (current) use of anticoagulants: Secondary | ICD-10-CM | POA: Diagnosis not present

## 2018-03-28 DIAGNOSIS — R0981 Nasal congestion: Secondary | ICD-10-CM | POA: Diagnosis present

## 2018-03-28 MED ORDER — FLUTICASONE PROPIONATE 50 MCG/ACT NA SUSP: 1.0000 | Freq: Every day | NASAL | 0 refills | Status: DC

## 2018-03-28 MED ORDER — PHENOL 1.4 % MT LIQD
1.0000 | OROMUCOSAL | 0 refills | Status: DC | PRN
Start: 2018-03-28 — End: 2020-11-11

## 2018-03-28 NOTE — Discharge Instructions (Addendum)
You likely have a viral illness.  This should be treated symptomatically. Use Tylenol or ibuprofen as needed for fevers or body aches. Use Flonase daily for nasal congestion and cough. Use sore throat spray as needed. Make sure you stay well-hydrated with water. Wash your hands frequently to prevent spread of infection. Follow-up with your primary care. There is information for the clinic upstairs listed below. Return to the emergency room if you develop chest pain, difficulty breathing, or any new or worsening symptoms.

## 2018-03-28 NOTE — ED Triage Notes (Signed)
Cough and sore throat since this am.

## 2018-03-28 NOTE — ED Provider Notes (Signed)
[facility] EMERGENCY DEPARTMENT Provider Note   CSN: [id number] Arrival date & time: 03/28/18  1203     History   Chief Complaint Chief Complaint  Patient presents with  . Cough  . Sore Throat    HPI Monica Holland is a 32 y.o. female presenting for evaluation of nasal congestion, cough, sore throat and chills.  Patient states her symptoms began around 230 this morning.  She reports subjective chills, no known fever.  She reports sore throat, which radiates up behind her ears bilaterally.  She has a nonproductive cough associated nasal congestion.  She denies chest pain, shortness of breath, nausea, vomiting, domino pain, urinary symptoms, normal bowel movements.  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  She smokes tobacco daily, but is trying to quit.  She denies history of asthma or COPD.  She denies sick contacts.  She did not get her flu shot this year.  HPI  Past Medical History:  Diagnosis Date  . DVT (deep venous thrombosis) (HCC)     There are no active problems to display for this patient.   Past Surgical History:  Procedure Laterality Date  . ANKLE SURGERY Right Jan 2014     OB History   No obstetric history on file.      Home Medications    Prior to Admission medications   Medication Sig Start Date End Date Taking? Authorizing Provider  acetaminophen (TYLENOL) 500 MG tablet Take 500 mg by mouth every 6 (six) hours as needed for moderate pain.    [provider]  acetaminophen-codeine (TYLENOL #3) 300-30 MG per tablet Take 1-2 tablets by mouth every 6 (six) hours as needed. Patient not taking: Reported on 04/19/2014 03/05/13   [name], MD  albuterol (PROVENTIL HFA;VENTOLIN HFA) 108 (90 BASE) MCG/ACT inhaler Inhale 2 puffs into the lungs every 6 (six) hours as needed for wheezing or  shortness of breath.    [provider]  cyclobenzaprine (FLEXERIL) 5 MG tablet Take 1 tablet (5 mg total) by mouth 3 (three) times daily as needed for muscle spasms. 04/19/14   [name], NP  fluticasone (FLONASE) 50 MCG/ACT nasal spray Place 1 spray into both nostrils daily. 03/28/18   Kellen Hover, PA-C  ibuprofen (ADVIL,MOTRIN) 200 MG tablet Take 400 mg by mouth every 6 (six) hours as needed for moderate pain.    [provider]  phenol (CHLORASEPTIC) 1.4 % LIQD Use as directed 1 spray in the mouth or throat as needed for throat irritation / pain. 03/28/18   Jaxin Fulfer, PA-C  Rivaroxaban 15 & 20 MG TBPK Take as directed on package: Start with one 15mg  tablet by mouth twice a day with food. On Day 22, switch to one 20mg  tablet once a day with food. 03/05/13   [name], MD  warfarin (COUMADIN) 7.5 MG tablet Take 1 tablet (7.5 mg total) by mouth 1 day or 1 dose. 04/19/14   [name], NP    Family History No family history on file.  Social History Social History   Tobacco Use  . Smoking status: Current Every Day Smoker    Types: Cigarettes  . Smokeless tobacco: Never Used  Substance Use Topics  . Alcohol use: Yes    Comment: weekends (a lot)  . Drug use: No  Allergies   Patient has no known allergies.   Review of Systems Review of Systems  Constitutional: Positive for chills.  HENT: Positive for congestion and sore throat.   Respiratory: Positive for cough.   All other systems reviewed and are negative.    Physical Exam Updated Vital Signs BP (!) 119/94   Pulse 74   Temp 98.2 F (36.8 C) (Oral)   Resp 16   Ht 5\' 7"  (1.702 m)   Wt 76.7 kg   LMP 03/25/2018   SpO2 100%   BMI 26.47 kg/m   Physical Exam Vitals signs and nursing note reviewed.  Constitutional:      General: She is not in acute distress.    Appearance: She is well-developed.     Comments: Appears nontoxic  HENT:     Head: Normocephalic and atraumatic.      Right Ear: Ear canal and external ear normal. No middle ear effusion. Tympanic membrane is erythematous. Tympanic membrane is not retracted or bulging.     Left Ear: Ear canal and external ear normal.  No middle ear effusion. Tympanic membrane is erythematous. Tympanic membrane is not retracted or bulging.     Nose: Mucosal edema and congestion present.     Right Sinus: No maxillary sinus tenderness or frontal sinus tenderness.     Left Sinus: No maxillary sinus tenderness or frontal sinus tenderness.     Mouth/Throat:     Mouth: Mucous membranes are moist.     Pharynx: Uvula midline. Posterior oropharyngeal erythema present.     Tonsils: No tonsillar exudate. Swelling: 1+ on the right. 1+ on the left.     Comments: Nasal mucosal edema/congestion.  OP mildly erythematous with 1+ tonsillar swelling bilaterally.  No exudate noted.  TMs with erythema but no bulging or obvious infections.  Uvula midline with equal palate rise.  No muffled voice.  Handling secretions easily.  Airway intact. Eyes:     Conjunctiva/sclera: Conjunctivae normal.     Pupils: Pupils are equal, round, and reactive to light.  Neck:     Musculoskeletal: Normal range of motion.  Cardiovascular:     Rate and Rhythm: Normal rate and regular rhythm.  Pulmonary:     Effort: Pulmonary effort is normal.     Breath sounds: Normal breath sounds. No decreased breath sounds, wheezing, rhonchi or rales.     Comments: speaking in full sentences.  Clear lung sounds in all fields. Abdominal:     General: There is no distension.     Palpations: Abdomen is soft.     Tenderness: There is no abdominal tenderness.  Musculoskeletal: Normal range of motion.     Comments: Chronic left leg swelling, patient states this is baseline.  No erythema or significant tenderness.  Pedal pulses intact bilaterally.  Lymphadenopathy:     Cervical: Cervical adenopathy present.  Skin:    General: Skin is warm.     Capillary Refill: Capillary refill takes  less than 2 seconds.  Neurological:     Mental Status: She is alert and oriented to person, place, and time.      ED Treatments / Results  Labs (all labs ordered are listed, but only abnormal results are displayed) Labs Reviewed - No data to display  EKG None  Radiology No results found.  Procedures Procedures (including critical care time)  Medications Ordered in ED Medications - No data to display   Initial Impression / Assessment and Plan / ED Course  I have reviewed the  triage vital signs and the nursing notes.  Pertinent labs & imaging results that were available during my care of the patient were reviewed by me and considered in my medical decision making (see chart for details).     Patient presenting with 1 day h/o URI symptoms.  Physical exam reassuring, patient is afebrile and appears nontoxic.  Pulmonary exam reassuring.  Doubt pneumonia, strep, other bacterial infection, or peritonsillar abscess. Likely viral URI. Consider flu, but as she is healthy and without significant risk factors, will not given tamiflu. No testing ordered.  Will treat symptomatically.  Gust importance of follow-up with primary care, as patient needs to be on her blood thinner for her chronic DVT.  Patient increased leg pain or swelling, no chest pain or shortness of breath.  No tachycardia or hypotension.  Low suspicion for PE at this time.  Discussed importance of smoking cessation.  At this time, patient appears safe for discharge.  Return precautions given.  Patient states he understands and agrees to plan.  Final Clinical Impressions(s) / ED Diagnoses   Final diagnoses:  Influenza-like illness    ED Discharge Orders         Ordered    phenol (CHLORASEPTIC) 1.4 % LIQD  As needed     03/28/18 1251    fluticasone (FLONASE) 50 MCG/ACT nasal spray  Daily     03/28/18 1251           Tyquarius Paglia, PA-C 03/28/18 1315    Vanetta Mulders, MD 03/29/18 [phone]

## 2018-05-03 DIAGNOSIS — F17210 Nicotine dependence, cigarettes, uncomplicated: Secondary | ICD-10-CM | POA: Insufficient documentation

## 2018-08-01 DIAGNOSIS — I82402 Acute embolism and thrombosis of unspecified deep veins of left lower extremity: Secondary | ICD-10-CM | POA: Insufficient documentation

## 2019-04-14 ENCOUNTER — Emergency Department (HOSPITAL_COMMUNITY)
Admission: EM | Admit: 2019-04-14 | Discharge: 2019-04-14 | Disposition: A | Discharge status: Home / Self Care | Payer: Medicaid Other | Attending: Emergency Medicine | Admitting: Emergency Medicine

## 2019-04-14 ENCOUNTER — Other Ambulatory Visit: Payer: Self-pay

## 2019-04-14 ENCOUNTER — Emergency Department (HOSPITAL_COMMUNITY): Payer: Medicaid Other

## 2019-04-14 ENCOUNTER — Encounter (HOSPITAL_COMMUNITY): Payer: Self-pay | Admitting: Emergency Medicine

## 2019-04-14 DIAGNOSIS — U071 COVID-19: Secondary | ICD-10-CM | POA: Insufficient documentation

## 2019-04-14 DIAGNOSIS — F17210 Nicotine dependence, cigarettes, uncomplicated: Secondary | ICD-10-CM | POA: Insufficient documentation

## 2019-04-14 DIAGNOSIS — Z79899 Other long term (current) drug therapy: Secondary | ICD-10-CM | POA: Diagnosis not present

## 2019-04-14 DIAGNOSIS — Z20822 Contact with and (suspected) exposure to covid-19: Secondary | ICD-10-CM

## 2019-04-14 DIAGNOSIS — R52 Pain, unspecified: Secondary | ICD-10-CM | POA: Diagnosis present

## 2019-04-14 LAB — POC SARS CORONAVIRUS 2 AG -  ED: SARS Coronavirus 2 Ag: NEGATIVE

## 2019-04-14 MED ORDER — BENZONATATE 100 MG PO CAPS: 100.0000 mg | ORAL_CAPSULE | Freq: Three times a day (TID) | ORAL | 0 refills | Status: DC | PRN

## 2019-04-14 MED ORDER — ACETAMINOPHEN 500 MG PO TABS: 500.0000 mg | ORAL_TABLET | Freq: Four times a day (QID) | ORAL | 0 refills | Status: DC | PRN

## 2019-04-14 NOTE — ED Triage Notes (Signed)
Reports been feeling bad for 2 1/2 weeks. Oldest son tested positive for Covid today.

## 2019-04-14 NOTE — ED Provider Notes (Signed)
[facility]-EMERGENCY DEPT Provider Note   CSN: [id number] Arrival date & time: 04/14/19  1721     History Chief Complaint  Patient presents with  . Generalized Body Aches    Monica Holland is a 33 y.o. 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  Reports her son tested positive for Covid today.  Notes intermittent shortness of breath mostly associated with cough but denies dyspnea on exertion, chest pains, abdominal pain, nausea, or vomiting.  Has not tried anything for her symptoms.  She is currently anticoagulated on Eliquis but reports that she has missed approximately 7 doses in the last 2 weeks because she sometimes forgets to take her medication.  Notes multiple sick contacts at home.  She is a current smoker.  No recent travel or surgeries, no hemoptysis, not on hormone replacement therapy.   The history is provided by the patient.       Past Medical History:  Diagnosis Date  . DVT (deep venous thrombosis) (HCC)     There are no problems to display for this patient.   Past Surgical History:  Procedure Laterality Date  . ANKLE SURGERY Right Jan 2014     OB History   No obstetric history on file.     No family history on file.  Social History   Tobacco Use  . Smoking status: Current Every Day Smoker    Types: Cigarettes  . Smokeless tobacco: Never Used  Substance Use Topics  . Alcohol use: Yes    Comment: weekends (a lot)  . Drug use: No    Home Medications Prior to Admission medications   Medication Sig Start Date End Date Taking? Authorizing Provider  acetaminophen (TYLENOL) 500 MG tablet Take 1 tablet (500 mg total) by mouth every 6 (six) hours as needed. 04/14/19   Keron Koffman A, PA-C  acetaminophen-codeine (TYLENOL #3) 300-30 MG per tablet Take 1-2 tablets by mouth every 6 (six) hours as needed. Patient not taking:  Reported on 04/19/2014 03/05/13   Derwood KaplanNanavati, Ankit, MD  albuterol (PROVENTIL HFA;VENTOLIN HFA) 108 (90 BASE) MCG/ACT inhaler Inhale 2 puffs into the lungs every 6 (six) hours as needed for wheezing or shortness of breath.    [provider]  benzonatate (TESSALON) 100 MG capsule Take 1 capsule (100 mg total) by mouth 3 (three) times daily as needed for cough. 04/14/19   Luevenia MaxinFawze, Akshat Minehart A, PA-C  cyclobenzaprine (FLEXERIL) 5 MG tablet Take 1 tablet (5 mg total) by mouth 3 (three) times daily as needed for muscle spasms. 04/19/14   Earley FavorSchulz, Gail, NP  fluticasone (FLONASE) 50 MCG/ACT nasal spray Place 1 spray into both nostrils daily. 03/28/18   Caccavale, Sophia, PA-C  ibuprofen (ADVIL,MOTRIN) 200 MG tablet Take 400 mg by mouth every 6 (six) hours as needed for moderate pain.    [provider]  phenol (CHLORASEPTIC) 1.4 % LIQD Use as directed 1 spray in the mouth or throat as needed for throat irritation / pain. 03/28/18   Caccavale, Sophia, PA-C  Rivaroxaban 15 & 20 MG TBPK Take as directed on package: Start with one 15mg  tablet by mouth twice a day with food. On Day 22, switch to one 20mg  tablet once a day with food. 03/05/13   Derwood KaplanNanavati, Ankit, MD  warfarin (COUMADIN) 7.5 MG tablet Take 1 tablet (7.5 mg total) by mouth 1 day or 1 dose. 04/19/14   Earley FavorSchulz, Gail, NP  Allergies    Patient has no known allergies.  Review of Systems   Review of Systems  Constitutional: Negative for chills and fever.  HENT: Positive for congestion.   Respiratory: Positive for cough and shortness of breath.   Cardiovascular: Negative for chest pain and leg swelling.  Gastrointestinal: Negative for abdominal pain, diarrhea, nausea and vomiting.  All other systems reviewed and are negative.   Physical Exam Updated Vital Signs BP 128/85   Pulse 88   Temp 98.2 F (36.8 C) (Oral)   Resp 17   SpO2 100%   Physical Exam Vitals and nursing note reviewed.  Constitutional:      General: She is not in acute  distress.    Appearance: She is well-developed.     Comments: Resting comfortably in chair  HENT:     Head: Normocephalic and atraumatic.  Eyes:     General:        Right eye: No discharge.        Left eye: No discharge.     Conjunctiva/sclera: Conjunctivae normal.  Neck:     Vascular: No JVD.     Trachea: No tracheal deviation.  Cardiovascular:     Rate and Rhythm: Normal rate and regular rhythm.  Pulmonary:     Effort: Pulmonary effort is normal.     Breath sounds: Normal breath sounds.     Comments: Speaking in full sentences without difficulty, SPO2 saturations 100% on room air Chest:     Chest wall: No tenderness.  Abdominal:     General: Bowel sounds are normal. There is no distension.     Palpations: Abdomen is soft.     Tenderness: There is no abdominal tenderness. There is no guarding or rebound.  Skin:    General: Skin is warm and dry.     Findings: No erythema.  Neurological:     Mental Status: She is alert.  Psychiatric:        Behavior: Behavior normal.     ED Results / Procedures / Treatments   Labs (all labs ordered are listed, but only abnormal results are displayed) Labs Reviewed  NOVEL CORONAVIRUS, NAA (HOSP ORDER, SEND-OUT TO REF LAB; TAT 18-24 HRS)  POC SARS CORONAVIRUS 2 AG -  ED    EKG None  Radiology DG Chest Portable 1 View  Result Date: 04/14/2019 CLINICAL DATA:  Shortness of breath EXAM: PORTABLE CHEST 1 VIEW COMPARISON:  03/10/2013 FINDINGS: The heart size and mediastinal contours are within normal limits. Both lungs are clear. The visualized skeletal structures are unremarkable. IMPRESSION: No active disease. Electronically Signed   By: Jasmine Pang M.D.   On: 04/14/2019 19:52    Procedures Procedures (including critical care time)  Medications Ordered in ED Medications - No data to display  ED Course  I have reviewed the triage vital signs and the nursing notes.  Pertinent labs & imaging results that were available during my  care of the patient were reviewed by me and considered in my medical decision making (see chart for details).    MDM Rules/Calculators/A&P                      Monica Holland was evaluated in Emergency Department on 04/14/2019 for the symptoms described in the history of present illness. She was evaluated in the context of the global COVID-19 pandemic, which necessitated consideration that the patient might be at risk for infection with the SARS-CoV-2 virus that causes COVID-19. Institutional  protocols and algorithms that pertain to the evaluation of patients at risk for COVID-19 are in a state of rapid change based on information released by regulatory bodies including the CDC and federal and state organizations. These policies and algorithms were followed during the patient's care in the ED.  Patient presenting after known Covid exposure with Covid-like symptoms.  She is afebrile, vital signs are stable.  She is nontoxic in appearance.  No signs of respiratory distress on examination and chest x-ray shows no acute cardiopulmonary abnormalities.  She has a history of recurrent DVT for which she is on Eliquis but is not always compliant with this medication.  She was ambulated in the ED with stable SPO2 saturations, no tachycardia hypoxia or increased work of breathing.  She has no pleuritic chest pain.  I have a low suspicion of clinically significant PE at this time given she is overall quite well-appearing and her vital signs are stable.  But discussed the importance of medication compliance.  Point-of-care Covid test is negative, will obtain outpatient Covid test.  Discussed symptomatic management.  Discussed close follow-up with PCP and strict ED return precautions.  Patient verbalized understanding of and agreement with plan and is safe for discharge home at this time.  Discussed with Dr. Roslynn Amble who agrees with assessment and plan at this time.   Final Clinical Impression(s) / ED Diagnoses Final  diagnoses:  Suspected COVID-19 virus infection    Rx / DC Orders ED Discharge Orders         Ordered    benzonatate (TESSALON) 100 MG capsule  3 times daily PRN     04/14/19 1949    acetaminophen (TYLENOL) 500 MG tablet  Every 6 hours PRN     04/14/19 1949           Renita Papa, PA-C 04/14/19 2215    Lucrezia Starch, MD 04/16/19 [phone]

## 2019-04-14 NOTE — Discharge Instructions (Signed)
Your point-of-care Covid test was negative so we will obtain outpatient Covid test which will result within 24 hours.  You will receive a phone call if your test is positive, no phone call if your test is negative.  In the meantime you can take 1 to 2 tablets of Tylenol every 6 hours as needed for fever pain.  Drink plenty of fluids and get plenty of rest.  Take Tessalon as needed for cough.  Also use over-the-counter medications such as Mucinex, Delsym, allergy medicine such as Zyrtec or Allegra that can help with congestion and cough.  Please quarantine at home for the next 10 days at least.  You may find it helpful to purchase a device called a pulse oximeter which can check your oxygen levels.  You can get this at any pharmacy or online.  Come to the emergency department immediately if O2 saturations dropped below 90%.  If you find that you are having worsening shortness of breath and O2 saturations of around 90 to 93% that is also concerning.  Return to the emergency department if any concerning signs or symptoms develop such as high fevers, persistent vomiting, chest pains, loss of consciousness.

## 2019-04-14 NOTE — ED Notes (Signed)
Patient ambulatory without difficulty. Oxygen saturation remained 97%-100%.

## 2019-04-15 LAB — NOVEL CORONAVIRUS, NAA (HOSP ORDER, SEND-OUT TO REF LAB; TAT 18-24 HRS): SARS-CoV-2, NAA: DETECTED — AB

## 2019-04-16 ENCOUNTER — Other Ambulatory Visit: Payer: Self-pay

## 2019-04-16 DIAGNOSIS — Z20822 Contact with and (suspected) exposure to covid-19: Secondary | ICD-10-CM

## 2019-04-17 ENCOUNTER — Encounter: Payer: Self-pay | Admitting: *Deleted

## 2019-04-17 LAB — NOVEL CORONAVIRUS, NAA: SARS-CoV-2, NAA: DETECTED — AB

## 2019-04-19 ENCOUNTER — Telehealth (HOSPITAL_COMMUNITY): Payer: Self-pay | Admitting: Critical Care Medicine

## 2019-04-19 NOTE — Telephone Encounter (Signed)
I connected this patient who tested + December 30 Covid.  She actually been to the ER on 28 December and received a positive test at that time.  She is currently now asymptomatic.  She knows to stay in isolation till 8 January.  She is not a monoclonal antibody candidate

## 2020-02-04 ENCOUNTER — Other Ambulatory Visit: Payer: Medicaid Other

## 2020-02-04 DIAGNOSIS — Z20822 Contact with and (suspected) exposure to covid-19: Secondary | ICD-10-CM

## 2020-02-05 LAB — SARS-COV-2, NAA 2 DAY TAT

## 2020-02-05 LAB — NOVEL CORONAVIRUS, NAA: SARS-CoV-2, NAA: NOT DETECTED

## 2020-04-02 ENCOUNTER — Emergency Department (HOSPITAL_COMMUNITY)
Admission: EM | Admit: 2020-04-02 | Discharge: 2020-04-02 | Disposition: A | Discharge status: Home / Self Care | Payer: Medicaid Other | Attending: Emergency Medicine | Admitting: Emergency Medicine

## 2020-04-02 ENCOUNTER — Encounter (HOSPITAL_COMMUNITY): Payer: Self-pay

## 2020-04-02 ENCOUNTER — Emergency Department (HOSPITAL_COMMUNITY): Payer: Medicaid Other

## 2020-04-02 ENCOUNTER — Emergency Department (HOSPITAL_BASED_OUTPATIENT_CLINIC_OR_DEPARTMENT_OTHER)
Admit: 2020-04-02 | Discharge: 2020-04-02 | Disposition: A | Discharge status: Home / Self Care | Payer: Medicaid Other | Attending: Emergency Medicine | Admitting: Emergency Medicine

## 2020-04-02 ENCOUNTER — Other Ambulatory Visit: Payer: Self-pay

## 2020-04-02 ENCOUNTER — Ambulatory Visit (HOSPITAL_COMMUNITY)
Admission: EM | Admit: 2020-04-02 | Discharge: 2020-04-02 | Disposition: A | Discharge status: Home / Self Care | Payer: Medicaid Other

## 2020-04-02 ENCOUNTER — Encounter (HOSPITAL_COMMUNITY): Payer: Self-pay | Admitting: Emergency Medicine

## 2020-04-02 DIAGNOSIS — R600 Localized edema: Secondary | ICD-10-CM | POA: Diagnosis present

## 2020-04-02 DIAGNOSIS — R197 Diarrhea, unspecified: Secondary | ICD-10-CM | POA: Insufficient documentation

## 2020-04-02 DIAGNOSIS — Z86718 Personal history of other venous thrombosis and embolism: Secondary | ICD-10-CM | POA: Diagnosis not present

## 2020-04-02 DIAGNOSIS — R112 Nausea with vomiting, unspecified: Secondary | ICD-10-CM

## 2020-04-02 DIAGNOSIS — R1084 Generalized abdominal pain: Secondary | ICD-10-CM

## 2020-04-02 DIAGNOSIS — Z7901 Long term (current) use of anticoagulants: Secondary | ICD-10-CM | POA: Insufficient documentation

## 2020-04-02 DIAGNOSIS — I82532 Chronic embolism and thrombosis of left popliteal vein: Secondary | ICD-10-CM | POA: Diagnosis not present

## 2020-04-02 DIAGNOSIS — R609 Edema, unspecified: Secondary | ICD-10-CM

## 2020-04-02 DIAGNOSIS — F17210 Nicotine dependence, cigarettes, uncomplicated: Secondary | ICD-10-CM | POA: Diagnosis not present

## 2020-04-02 DIAGNOSIS — M79605 Pain in left leg: Secondary | ICD-10-CM | POA: Diagnosis not present

## 2020-04-02 LAB — CBC WITH DIFFERENTIAL/PLATELET
Abs Immature Granulocytes: 0.01 10*3/uL (ref 0.00–0.07)
Basophils Absolute: 0 10*3/uL (ref 0.0–0.1)
Basophils Relative: 0 %
Eosinophils Absolute: 0.1 10*3/uL (ref 0.0–0.5)
Eosinophils Relative: 2 %
HCT: 40.9 % (ref 36.0–46.0)
Hemoglobin: 13.5 g/dL (ref 12.0–15.0)
Immature Granulocytes: 0 %
Lymphocytes Relative: 25 %
Lymphs Abs: 0.8 10*3/uL (ref 0.7–4.0)
MCH: 28.5 pg (ref 26.0–34.0)
MCHC: 33 g/dL (ref 30.0–36.0)
MCV: 86.3 fL (ref 80.0–100.0)
Monocytes Absolute: 0.2 10*3/uL (ref 0.1–1.0)
Monocytes Relative: 6 %
Neutro Abs: 2.1 10*3/uL (ref 1.7–7.7)
Neutrophils Relative %: 67 %
Platelets: 151 10*3/uL (ref 150–400)
RBC: 4.74 MIL/uL (ref 3.87–5.11)
RDW: 14.9 % (ref 11.5–15.5)
WBC: 3.2 10*3/uL — ABNORMAL LOW (ref 4.0–10.5)
nRBC: 0 % (ref 0.0–0.2)

## 2020-04-02 LAB — COMPREHENSIVE METABOLIC PANEL
ALT: 20 U/L (ref 0–44)
AST: 21 U/L (ref 15–41)
Albumin: 3.5 g/dL (ref 3.5–5.0)
Alkaline Phosphatase: 41 U/L (ref 38–126)
Anion gap: 7 (ref 5–15)
BUN: 5 mg/dL — ABNORMAL LOW (ref 6–20)
CO2: 24 mmol/L (ref 22–32)
Calcium: 8.4 mg/dL — ABNORMAL LOW (ref 8.9–10.3)
Chloride: 105 mmol/L (ref 98–111)
Creatinine, Ser: 0.74 mg/dL (ref 0.44–1.00)
GFR, Estimated: >60 mL/min (ref >60)
Glucose, Bld: 99 mg/dL (ref 70–99)
Potassium: 3.8 mmol/L (ref 3.5–5.1)
Sodium: 136 mmol/L (ref 135–145)
Total Bilirubin: 0.7 mg/dL (ref 0.3–1.2)
Total Protein: 6.1 g/dL — ABNORMAL LOW (ref 6.5–8.1)

## 2020-04-02 LAB — URINALYSIS, ROUTINE W REFLEX MICROSCOPIC
Bacteria, UA: NONE SEEN
Bilirubin Urine: NEGATIVE
Glucose, UA: NEGATIVE mg/dL
Hgb urine dipstick: NEGATIVE
Ketones, ur: 5 mg/dL — AB
Nitrite: NEGATIVE
Protein, ur: NEGATIVE mg/dL
Specific Gravity, Urine: >1.046 — ABNORMAL HIGH (ref 1.005–1.030)
pH: 7 (ref 5.0–8.0)

## 2020-04-02 LAB — BRAIN NATRIURETIC PEPTIDE: B Natriuretic Peptide: 47.8 pg/mL (ref 0.0–100.0)

## 2020-04-02 LAB — LIPASE, BLOOD: Lipase: 23 U/L (ref 11–51)

## 2020-04-02 LAB — I-STAT BETA HCG BLOOD, ED (NOT ORDERABLE): I-stat hCG, quantitative: <5 m[IU]/mL (ref <5)

## 2020-04-02 LAB — APTT: aPTT: 29 seconds (ref 24–36)

## 2020-04-02 MED ORDER — IOHEXOL 300 MG/ML  SOLN
100.0000 mL | Freq: Once | INTRAMUSCULAR | Status: AC | PRN
  Administered 2020-04-02: 19:00:00 100 mL via INTRAVENOUS

## 2020-04-02 MED ORDER — APIXABAN (ELIQUIS) VTE STARTER PACK (10MG AND 5MG): ORAL_TABLET | ORAL | 0 refills | Status: DC

## 2020-04-02 MED ORDER — SODIUM CHLORIDE 0.9 % IV BOLUS
1000.0000 mL | Freq: Once | INTRAVENOUS | Status: AC
  Administered 2020-04-02: 1000 mL via INTRAVENOUS

## 2020-04-02 MED ORDER — ONDANSETRON 4 MG PO TBDP: 4.0000 mg | ORAL_TABLET | Freq: Three times a day (TID) | ORAL | 0 refills | Status: DC | PRN

## 2020-04-02 MED ORDER — ONDANSETRON HCL 4 MG/2ML IJ SOLN
4.0000 mg | Freq: Once | INTRAMUSCULAR | Status: AC
  Administered 2020-04-02: 4 mg via INTRAVENOUS
  Filled 2020-04-02: qty 2

## 2020-04-02 MED ORDER — APIXABAN 5 MG PO TABS: 5.0000 mg | ORAL_TABLET | Freq: Two times a day (BID) | ORAL | Status: DC

## 2020-04-02 MED ORDER — APIXABAN 5 MG PO TABS
10.0000 mg | ORAL_TABLET | Freq: Two times a day (BID) | ORAL | Status: DC
  Administered 2020-04-02: 22:00:00 10 mg via ORAL
  Filled 2020-04-02: qty 2

## 2020-04-02 NOTE — Progress Notes (Signed)
ANTICOAGULATION CONSULT NOTE - Initial Consult  Pharmacy Consult for Eliquis Indication: DVT  No Known Allergies  Patient Measurements:   Heparin Dosing Weight:   Vital Signs: Temp: 99.2 F (37.3 C) (12/17 1415) Temp Source: Oral (12/17 1415) BP: 126/87 (12/17 2030) Pulse Rate: 72 (12/17 2030)  Labs: Recent Labs    04/02/20 1716 04/02/20 1954  HGB  --  13.5  HCT  --  40.9  PLT  --  151  CREATININE 0.74  --     CrCl cannot be calculated (Unknown ideal weight.).   Medical History: Past Medical History:  Diagnosis Date  . DVT (deep venous thrombosis) (HCC)     Medications:  (Not in a hospital admission)  Scheduled:  Infusions:   Assessment: 45 yoF presents to ED with abdominal pain, V/D.  PMH includes DVT, PE,  noncompliant with Eliquis per patient. Previous med rec also has Xarelto from 2014 and warfarin from 2016.  Most recent PTA Eliquis - last dose taken several months ago 12/17 Doppler: LLE + chronic DVT involving the left popliteal vein. SCr, LFTs, CBC are WNL Baseline coags pending.  Goal of Therapy:  Monitor platelets by anticoagulation protocol: Yes   Plan:  Apixaban 10mg  PO BID x7 days, then 5 mg PO BID Pharmacy completed education 04/02/20 in ED. Pharmacy provided 30 day discount card.  Patient states her Rx copay is only $3 and she will be able to afford refills.   04/04/20 PharmD, BCPS Clinical Pharmacist [facility] main pharmacy [phone] 04/02/2020 8:53 PM

## 2020-04-02 NOTE — Discharge Instructions (Addendum)
Go to the emergency department for evaluation of your symptoms.    

## 2020-04-02 NOTE — Progress Notes (Signed)
Bilateral lower extremity venous duplex has been completed. Preliminary results can be found in CV Proc through chart review.  Results were given to [facility] PA.  04/02/20 6:02 PM Olen Cordial RVT

## 2020-04-02 NOTE — ED Provider Notes (Signed)
[facility]    CSN: [id number] Arrival date & time: 04/02/20  1158      History   Chief Complaint Chief Complaint  Patient presents with  . Abdominal Pain  . Emesis  . dvt    HPI Monica Holland is a 34 y.o. female.   Patient presents with 3-day history of abdominal pain, nausea, vomiting, diarrhea.  She reports at least 10 episodes of emesis in the last 24 hours and 6 episodes of diarrhea today.  She also reports pain and swelling in her left leg; she has a history of DVT.  She states she is not taking her Eliquis due to the cost.  She denies fever, chills, chest pain, shortness of breath, or other symptoms.  No treatments attempted at home.  The history is provided by the patient and medical records.    Past Medical History:  Diagnosis Date  . DVT (deep venous thrombosis) (HCC)     There are no problems to display for this patient.   Past Surgical History:  Procedure Laterality Date  . ANKLE SURGERY Right Jan 2014    OB History   No obstetric history on file.      Home Medications    Prior to Admission medications   Medication Sig Start Date End Date Taking? Authorizing Provider  acetaminophen (TYLENOL) 500 MG tablet Take 1 tablet (500 mg total) by mouth every 6 (six) hours as needed. 04/14/19   [name], [name], PA-C  acetaminophen-codeine (TYLENOL #3) 300-30 MG per tablet Take 1-2 tablets by mouth every 6 (six) hours as needed. Patient not taking: No sig reported 03/05/13   [name], MD  albuterol (PROVENTIL HFA;VENTOLIN HFA) 108 (90 BASE) MCG/ACT inhaler Inhale 2 puffs into the lungs every 6 (six) hours as needed for wheezing or shortness of breath.    [provider]  benzonatate (TESSALON) 100 MG capsule Take 1 capsule (100 mg total) by mouth 3 (three) times daily as needed for cough. 04/14/19   [name], [name], PA-C  cyclobenzaprine (FLEXERIL) 5 MG tablet Take 1 tablet (5 mg total) by mouth 3 (three) times daily as needed for muscle  spasms. 04/19/14   [name], NP  fluticasone (FLONASE) 50 MCG/ACT nasal spray Place 1 spray into both nostrils daily. 03/28/18   Caccavale, Sophia, PA-C  ibuprofen (ADVIL,MOTRIN) 200 MG tablet Take 400 mg by mouth every 6 (six) hours as needed for moderate pain.    [provider]  phenol (CHLORASEPTIC) 1.4 % LIQD Use as directed 1 spray in the mouth or throat as needed for throat irritation / pain. 03/28/18   Caccavale, Sophia, PA-C  Rivaroxaban 15 & 20 MG TBPK Take as directed on package: Start with one 15mg  tablet by mouth twice a day with food. On Day 22, switch to one 20mg  tablet once a day with food. 03/05/13   , MD  warfarin (COUMADIN) 7.5 MG tablet Take 1 tablet (7.5 mg total) by mouth 1 day or 1 dose. 04/19/14   [name], NP    Family History No family history on file.  Social History Social History   Tobacco Use  . Smoking status: Current Every Day Smoker    Types: Cigarettes  . Smokeless tobacco: Never Used  Substance Use Topics  . Alcohol use: Yes    Comment: weekends (a lot)  . Drug use: No     Allergies   Patient has no known allergies.   Review of Systems Review of  Systems  Constitutional: Negative for chills and fever.  HENT: Negative for ear pain and sore throat.   Eyes: Negative for pain and visual disturbance.  Respiratory: Negative for cough and shortness of breath.   Cardiovascular: Negative for chest pain and palpitations.  Gastrointestinal: Positive for abdominal pain, diarrhea, nausea and vomiting.  Genitourinary: Negative for dysuria and hematuria.  Musculoskeletal: Positive for myalgias. Negative for arthralgias and back pain.  Skin: Negative for color change and rash.  Neurological: Negative for seizures and syncope.  All other systems reviewed and are negative.    Physical Exam Triage Vital Signs ED Triage Vitals [04/02/20 1322]  Enc Vitals Group     BP 124/78     Pulse Rate 94     Resp 16     Temp 98.9 F  (37.2 C)     Temp Source Oral     SpO2 100 %     Weight      Height      Head Circumference      Peak Flow      Pain Score 8     Pain Loc      Pain Edu?      Excl. in GC?    No data found.  Updated Vital Signs BP 124/78   Pulse 94   Temp 98.9 F (37.2 C) (Oral)   Resp 16   LMP 03/18/2020   SpO2 100%   Visual Acuity Right Eye Distance:   Left Eye Distance:   Bilateral Distance:    Right Eye Near:   Left Eye Near:    Bilateral Near:     Physical Exam Vitals and nursing note reviewed.  Constitutional:      General: She is not in acute distress.    Appearance: She is well-developed and well-nourished. She is not ill-appearing.  HENT:     Head: Normocephalic and atraumatic.     Mouth/Throat:     Mouth: Mucous membranes are moist.  Eyes:     Conjunctiva/sclera: Conjunctivae normal.  Cardiovascular:     Rate and Rhythm: Normal rate and regular rhythm.     Heart sounds: Normal heart sounds.  Pulmonary:     Effort: Pulmonary effort is normal. No respiratory distress.     Breath sounds: Normal breath sounds.  Abdominal:     General: Bowel sounds are normal.     Palpations: Abdomen is soft.     Tenderness: There is generalized abdominal tenderness. There is no right CVA tenderness or left CVA tenderness.  Musculoskeletal:        General: Swelling present. No edema. Normal range of motion.     Cervical back: Neck supple.     Comments: LLE edema.  Skin:    General: Skin is warm and dry.  Neurological:     General: No focal deficit present.     Mental Status: She is alert and oriented to person, place, and time.     Gait: Gait normal.  Psychiatric:        Mood and Affect: Mood and affect and mood normal.        Behavior: Behavior normal.      UC Treatments / Results  Labs (all labs ordered are listed, but only abnormal results are displayed) Labs Reviewed - No data to display  EKG   Radiology No results found.  Procedures Procedures (including  critical care time)  Medications Ordered in UC Medications - No data to display  Initial Impression / Assessment  and Plan / UC Course  I have reviewed the triage vital signs and the nursing notes.  Pertinent labs & imaging results that were available during my care of the patient were reviewed by me and considered in my medical decision making (see chart for details).   Abdominal pain, nausea, vomiting, diarrhea.  Left leg pain and swelling with history of DVT.  Sending patient to the ED for evaluation.  She drove herself here and feels stable to drive herself to the ED.   Final Clinical Impressions(s) / UC Diagnoses   Final diagnoses:  Generalized abdominal pain  Nausea vomiting and diarrhea  Left leg pain  Leg edema, left  History of DVT (deep vein thrombosis)     Discharge Instructions     Go to the emergency department for evaluation of your symptoms.    ED Prescriptions    None     PDMP not reviewed this encounter.   Mickie Bail, NP 04/02/20 1344

## 2020-04-02 NOTE — ED Provider Notes (Signed)
[facility]-EMERGENCY DEPT Provider Note   CSN: [id number] Arrival date & time: 04/02/20  1355     History Chief Complaint  Patient presents with  . Bilateral Leg Swelling  . Abdominal Pain  . Diarrhea    Monica Holland is a 34 y.o. female with a past medical history significant for previous DVT in 2004 and 2018 not currently on any anticoagulants who presents to the ED due to abdominal pain and bilateral lower extremity edema.  Patient admits to generalized abdominal tenderness x3 days associated with numerous episodes of nonbloody, nonbilious emesis and nonbloody diarrhea.  Patient states abdominal pain started in the epigastric region and spread throughout her abdomen next few days.  Admits to occasional alcohol use.  No chronic NSAID use.  Denies fever and chills.  No sick contacts or known Covid exposures.  No previous abdominal operations.  No recent antibiotics or undercooked food ingestions. No treatment prior to arrival.  Denies urinary and vaginal symptoms.  Patient also admits to bilateral lower extremity edema x5 days.  Patient states left lower extremity is worse than the right.  Previous history of DVT and not currently on any anticoagulants.  Denies any trauma.  Denies chest pain and shortness of breath.  Patient was sent to the ED from urgent care to rule out DVT.  History obtained from patient and past medical records. No interpreter used during encounter.      Past Medical History:  Diagnosis Date  . DVT (deep venous thrombosis) (HCC)     There are no problems to display for this patient.   Past Surgical History:  Procedure Laterality Date  . ANKLE SURGERY Right Jan 2014     OB History   No obstetric history on file.     History reviewed. No pertinent family history.  Social History   Tobacco Use  . Smoking status: Current Every Day Smoker    Types: Cigarettes  . Smokeless tobacco: Never Used  Substance Use Topics  . Alcohol  use: Yes    Comment: weekends (a lot)  . Drug use: No    Home Medications Prior to Admission medications   Medication Sig Start Date End Date Taking? Authorizing Provider  acetaminophen (TYLENOL) 500 MG tablet Take 1 tablet (500 mg total) by mouth every 6 (six) hours as needed. 04/14/19   [name], [name], PA-C  acetaminophen-codeine (TYLENOL #3) 300-30 MG per tablet Take 1-2 tablets by mouth every 6 (six) hours as needed. Patient not taking: No sig reported 03/05/13   [name], MD  albuterol (PROVENTIL HFA;VENTOLIN HFA) 108 (90 BASE) MCG/ACT inhaler Inhale 2 puffs into the lungs every 6 (six) hours as needed for wheezing or shortness of breath.    [provider]  APIXABAN Everlene Balls) VTE STARTER PACK (  AND ) Take as directed on package: start with two-5mg  tablets twice daily for 7 days. On day 8, switch to one-5mg  tablet twice daily. 04/02/20   Mannie Stabile, PA-C  benzonatate (TESSALON) 100 MG capsule Take 1 capsule (100 mg total) by mouth 3 (three) times daily as needed for cough. 04/14/19   [name], [name], PA-C  cyclobenzaprine (FLEXERIL) 5 MG tablet Take 1 tablet (5 mg total) by mouth 3 (three) times daily as needed for muscle spasms. 04/19/14   [name], NP  fluticasone (FLONASE) 50 MCG/ACT nasal spray Place 1 spray into both nostrils daily. 03/28/18   Caccavale, Sophia, PA-C  ibuprofen (ADVIL,MOTRIN) 200 MG tablet Take 400 mg by  mouth every 6 (six) hours as needed for moderate pain.    [provider]  ondansetron (ZOFRAN ODT) 4 MG disintegrating tablet Take 1 tablet (4 mg total) by mouth every 8 (eight) hours as needed for nausea or vomiting. 04/02/20   Audelia Acton, Merla Riches, PA-C  phenol (CHLORASEPTIC) 1.4 % LIQD Use as directed 1 spray in the mouth or throat as needed for throat irritation / pain. 03/28/18   Caccavale, Sophia, PA-C  Rivaroxaban 15 & 20 MG TBPK Take as directed on package: Start with one  tablet by mouth twice a day with food. On Day  22, switch to one  tablet once a day with food. 03/05/13   [name], MD  warfarin (COUMADIN) 7.5 MG tablet Take 1 tablet (7.5 mg total) by mouth 1 day or 1 dose. 04/19/14   [name], NP    Allergies    Patient has no known allergies.  Review of Systems   Review of Systems  Constitutional: Negative for chills and fever.  Respiratory: Negative for shortness of breath.   Cardiovascular: Positive for leg swelling. Negative for chest pain.  Gastrointestinal: Positive for abdominal pain, diarrhea, nausea and vomiting.  Neurological: Negative for numbness.  All other systems reviewed and are negative.   Physical Exam Updated Vital Signs BP 126/87   Pulse 72   Temp 99.2 F (37.3 C) (Oral)   Resp 18   LMP 03/18/2020   SpO2 100%   Physical Exam Vitals and nursing note reviewed.  Constitutional:      General: She is not in acute distress.    Appearance: She is not ill-appearing.  HENT:     Head: Normocephalic.  Eyes:     Pupils: Pupils are equal, round, and reactive to light.  Cardiovascular:     Rate and Rhythm: Normal rate and regular rhythm.     Pulses: Normal pulses.     Heart sounds: Normal heart sounds. No murmur heard. No friction rub. No gallop.   Pulmonary:     Effort: Pulmonary effort is normal.     Breath sounds: Normal breath sounds.  Abdominal:     General: Abdomen is flat. Bowel sounds are normal. There is no distension.     Palpations: Abdomen is soft.     Tenderness: There is abdominal tenderness. There is guarding. There is no right CVA tenderness or left CVA tenderness.     Comments: Generalized abdominal tenderness with voluntary guarding.  No rebound.  Musculoskeletal:     Cervical back: Neck supple.     Right lower leg: Edema present.     Left lower leg: Edema present.     Comments: Bilateral lower extremity edema (L>R). No calf tenderness. Negative homan sign bilaterally.   Skin:    General: Skin is warm and dry.  Neurological:      General: No focal deficit present.     Mental Status: She is alert.  Psychiatric:        Mood and Affect: Mood normal.        Behavior: Behavior normal.     ED Results / Procedures / Treatments   Labs (all labs ordered are listed, but only abnormal results are displayed) Labs Reviewed  COMPREHENSIVE METABOLIC PANEL - Abnormal; Notable for the following components:      Result Value   BUN 5 (*)    Calcium 8.4 (*)    Total Protein 6.1 (*)    All other components within normal limits  URINALYSIS, ROUTINE  W REFLEX MICROSCOPIC - Abnormal; Notable for the following components:   Specific Gravity, Urine >1.046 (*)    Ketones, ur 5 (*)    Leukocytes,Ua TRACE (*)    All other components within normal limits  CBC WITH DIFFERENTIAL/PLATELET - Abnormal; Notable for the following components:   WBC 3.2 (*)    All other components within normal limits  URINE CULTURE  LIPASE, BLOOD  BRAIN NATRIURETIC PEPTIDE  CBC WITH DIFFERENTIAL/PLATELET  I-STAT BETA HCG BLOOD, ED ([facility], [facility], [facility] ONLY)    EKG None  Radiology CT ABDOMEN PELVIS W CONTRAST  Result Date: 04/02/2020 CLINICAL DATA:  Abdominal pain, nonlocalized. Vomiting and diarrhea for 3 days EXAM: CT ABDOMEN AND PELVIS WITH CONTRAST TECHNIQUE: Multidetector CT imaging of the abdomen and pelvis was performed using the standard protocol following bolus administration of intravenous contrast. CONTRAST:  OMNIPAQUE IOHEXOL 300 MG/ML  SOLN COMPARISON:  CT angio chest 03/05/2013 FINDINGS: Lower chest: Stable (far back as 2014) left base nodule like density measuring 8 mm (4:30). Otherwise no acute abnormality. Hepatobiliary: No focal liver abnormality. No gallstones, gallbladder wall thickening, or pericholecystic fluid. No biliary dilatation. Pancreas: No focal lesion. Normal pancreatic contour. No surrounding inflammatory changes. No main pancreatic ductal dilatation. Spleen: Normal in size without focal abnormality. Adrenals/Urinary Tract: No  adrenal nodule bilaterally. Bilateral kidneys enhance symmetrically. Punctate calcified stone within the right kidney (2:23). No left nephrolithiasis. No ureterolithiasis bilaterally. No hydronephrosis. No hydroureter. The urinary bladder is unremarkable. Stomach/Bowel: Stomach is within normal limits. No evidence of bowel wall thickening or dilatation. Appendix appears normal. Vascular/Lymphatic: Dilated veins within the subcutaneus soft tissues anterior to the pubic symphysis. No significant vascular findings are present. No enlarged abdominal or pelvic lymph nodes. Reproductive: Prominent endometrial canal. Otherwise the uterus and bilateral adnexa are unremarkable. Other: Trace simple free fluid within the pelvis. No free intraperitoneal gas. No organized fluid collection. Musculoskeletal: No suspicious lytic or blastic osseous lesions. No acute displaced fracture. IMPRESSION: 1. Prominent endometrial canal.  Correlate with menstrual cycle. 2. Otherwise no acute intra-abdominal or intrapelvic abnormality. Electronically Signed   By: Tish Frederickson M.D.   On: 04/02/2020 19:24   VAS [place] LOWER EXTREMITY VENOUS (DVT) ([facility] 7a-7p)  Result Date: 04/02/2020  Lower Venous DVT Study Indications: Edema.  Risk Factors: DVT. Comparison Study: 04/21/2014 - Findings consistent with acute deep vein thrombosis                   involving the popliteal vein of the left lower extremity.                   - Findings consistent with chronic deep vein thrombosis                   involving the popliteal, femoral and common femoral veins of                   the left lower extremity. Performing Technologist: Chanda Busing RVT  Examination Guidelines: A complete evaluation includes B-mode imaging, spectral Doppler, color Doppler, and power Doppler as needed of all accessible portions of each vessel. Bilateral testing is considered an integral part of a complete examination. Limited examinations for reoccurring indications  may be performed as noted. The reflux portion of the exam is performed with the patient in reverse Trendelenburg.  +---------+---------------+---------+-----------+----------+--------------+ RIGHT    CompressibilityPhasicitySpontaneityPropertiesThrombus Aging +---------+---------------+---------+-----------+----------+--------------+ CFV      Full  Yes      Yes                                 +---------+---------------+---------+-----------+----------+--------------+ SFJ      Full                                                        +---------+---------------+---------+-----------+----------+--------------+ FV Prox  Full                                                        +---------+---------------+---------+-----------+----------+--------------+ FV Mid   Full                                                        +---------+---------------+---------+-----------+----------+--------------+ FV DistalFull                                                        +---------+---------------+---------+-----------+----------+--------------+ PFV      Full                                                        +---------+---------------+---------+-----------+----------+--------------+ POP      Full           Yes      Yes                                 +---------+---------------+---------+-----------+----------+--------------+ PTV      Full                                                        +---------+---------------+---------+-----------+----------+--------------+ PERO     Full                                                        +---------+---------------+---------+-----------+----------+--------------+   +---------+---------------+---------+-----------+----------+--------------+ LEFT     CompressibilityPhasicitySpontaneityPropertiesThrombus Aging +---------+---------------+---------+-----------+----------+--------------+ CFV       Full           Yes      Yes                                 +---------+---------------+---------+-----------+----------+--------------+ SFJ  Full                                                        +---------+---------------+---------+-----------+----------+--------------+ FV Prox  Full                                                        +---------+---------------+---------+-----------+----------+--------------+ FV Mid   Full                                                        +---------+---------------+---------+-----------+----------+--------------+ FV DistalFull                                                        +---------+---------------+---------+-----------+----------+--------------+ PFV      Full                                                        +---------+---------------+---------+-----------+----------+--------------+ POP      Partial        Yes      Yes                  Chronic        +---------+---------------+---------+-----------+----------+--------------+ PTV      Full                                                        +---------+---------------+---------+-----------+----------+--------------+ PERO     Full                                                        +---------+---------------+---------+-----------+----------+--------------+     Summary: RIGHT: - There is no evidence of deep vein thrombosis in the lower extremity.  - No cystic structure found in the popliteal fossa.  LEFT: - Findings consistent with chronic deep vein thrombosis involving the left popliteal vein. - No cystic structure found in the popliteal fossa.  *See table(s) above for measurements and observations.    Preliminary     Procedures Procedures (including critical care time)  Medications Ordered in ED Medications  sodium chloride 0.9 % bolus 1,000 mL (0 mLs Intravenous Stopped 04/02/20 1814)  ondansetron (ZOFRAN) injection 4 mg (4  mg Intravenous Given 04/02/20 1715)  iohexol (OMNIPAQUE) 300 MG/ML solution 100 mL (100 mLs Intravenous Contrast Given 04/02/20 1902)    ED Course  I have reviewed the triage vital signs and the nursing notes.  Pertinent labs & imaging results that were available during my care of the patient were reviewed by me and considered in my medical decision making (see chart for details).  Clinical Course as of 04/02/20 2049  Fri Apr 02, 2020  1923 B Natriuretic Peptide: 47.8 [CA]  2041 Leukocytes,Ua(!): TRACE [CA]    Clinical Course User Index [CA] Jesusita OkaAberman, Gertrude Tarbet C, PA-C   MDM Rules/Calculators/A&P                         34 year old female presents to the ED due to abdominal pain associate with nausea, vomiting, and diarrhea.  Denies melena, hematemesis, and hematochezia.  Patient also admits to bilateral lower extremity. History of DVT. Not currently on any anticoagulants.  Patient denies chest pain and shortness of breath.  Upon arrival, vitals all within normal limits.  Patient is afebrile, not tachycardic or hypoxic.  Patient no acute distress and nontoxic-appearing.  Physical exam significant for generalized abdominal tenderness with voluntary guarding.  No rebound. No focal tenderness. Bilateral lower extremity edema. Left > right. Negative homan sign bilaterally. Will obtain US to rule out DVT given history and not currently on any anticoagulants.  Low suspicion for PE given no chest pain, shortness of breath, and normal vitals.  Routine labs to rule out electrolyte abnormalities and infectious etiology.  BNP to rule out congestive heart failure given bilateral lower extremity edema.  IVFs and Zofran given for symptomatic relief. CT abdomen to rule out acute abdomen given significant tenderness and guarding. Discussed case with Dr. Estell HarpinZammit who agrees with assessment and plan.   US personally reviewed which demonstrates: Summary:  RIGHT:  - There is no evidence of deep vein thrombosis in  the lower extremity.    - No cystic structure found in the popliteal fossa.    LEFT:  - Findings consistent with chronic deep vein thrombosis involving the left popliteal vein.  - No cystic structure found in the popliteal fossa.    *See table(s) above for measurements and observations.   Lipase normal. Doubt pancreatitis. CMP reassuring. No major electrolyte abnormalities.   CT abdomen personally reviewed which demonstrates: IMPRESSION:  1. Prominent endometrial canal. Correlate with menstrual cycle.  2. Otherwise no acute intra-abdominal or intrapelvic abnormality.   Pregnancy test negative. CBC for mild leukopenia at 3.2, but otherwise unremarkable.  UA significant for trace leukocytes with no nitrites.  Urine culture pending.  Will hold antibiotics at this time given patient is currently asymptomatic.  BNP normal.  Doubt CHF.  Patient restarted on Eliquis for chronic DVT.  Suspect abdominal pai,/vomiting/diarrhea related to viral etiology.  Will discharge patient with Zofran.  Instructed patient to follow-up with PCP in regards to chronic DVT. OBGYN number given to patient to follow-up in regards to prominent endometrial canal.  No respiratory symptoms to suggest PE. Strict ED precautions discussed with patient. Patient states understanding and agrees to plan. Patient discharged home in no acute distress and stable vitals  Discussed case with Dr. Estell HarpinZammit who agrees with assessment and plan.  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

## 2020-04-02 NOTE — ED Triage Notes (Signed)
Pt coming from UC. Pt reports abdominal pain, vomiting and diarrhea x3 days. Pt states she has had a hx of DVT. Pt c/o bilateral leg pain and swelling, left leg worse. Pt noncompliant with eliquis.

## 2020-04-02 NOTE — Discharge Instructions (Addendum)
As discussed, your CT abdomen was negative for any acute abnormalities.  It did show a prominent endometrial canal.  Please follow-up with OB/GYN with the number that I have included on your dispo papers.  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  Please take as prescribed.  I have included the number for [facility].  Call Monday to schedule an appointment to establish care.  I suspect your abdominal pain, nausea, vomiting, and diarrhea are related to a viral infection.  I am sending home with nausea medication.  Take as needed.  Please follow-up with PCP if symptoms not improved within the next week.  Information on my medicine - ELIQUIS (apixaban) This medication education was reviewed with me or my healthcare representative as part of my discharge preparation.  The pharmacist that spoke with me during my hospital stay was: Wynona Canes PharmD Why was Eliquis prescribed for you? Eliquis was prescribed to treat blood clots that may have been found in the veins of your legs (deep vein thrombosis) or in your lungs (pulmonary embolism) and to reduce the risk of them occurring again.  What do You need to know about Eliquis ? The starting dose is 10 mg (two 5 mg tablets) taken TWICE daily for the FIRST SEVEN (7) DAYS, then on 12/25 the dose is reduced to ONE 5 mg tablet taken TWICE daily.  Eliquis may be taken with or without food.   Try to take the dose about the same time in the morning and in the evening. If you have difficulty swallowing the tablet whole please discuss with your pharmacist how to take the medication safely.  Take Eliquis exactly as prescribed and DO NOT stop taking Eliquis without talking to the doctor who prescribed the medication.  Stopping may increase your risk of developing a new blood clot.  Refill your prescription before you run out.  After discharge, you should have regular check-up appointments with your healthcare provider that is prescribing your  Eliquis.    What do you do if you miss a dose? If a dose of ELIQUIS is not taken at the scheduled time, take it as soon as possible on the same day and twice-daily administration should be resumed. The dose should not be doubled to make up for a missed dose.  Important Safety Information A possible side effect of Eliquis is bleeding. You should call your healthcare provider right away if you experience any of the following: ? Bleeding from an injury or your nose that does not stop. ? Unusual colored urine (red or dark brown) or unusual colored stools (red or black). ? Unusual bruising for unknown reasons. ? A serious fall or if you hit your head (even if there is no bleeding).  Some medicines may interact with Eliquis and might increase your risk of bleeding or clotting while on Eliquis. To help avoid this, consult your healthcare provider or pharmacist prior to using any new prescription or non-prescription medications, including herbals, vitamins, non-steroidal anti-inflammatory drugs (NSAIDs) and supplements.  This website has more information on Eliquis (apixaban): http://www.eliquis.com/eliquis/home

## 2020-04-02 NOTE — ED Notes (Signed)
Rounds performed. Blanket and ice chips provided to pt

## 2020-04-02 NOTE — ED Triage Notes (Signed)
PT reports abdominal pain, vomiting, diarrhea for 3 days. 10 episodes vomiting in last 24 hours, diarrhea 6 x so far today.   Also report DVT history. Has left leg pain and swelling. Has  Been off of her eliquis for months due to cost.

## 2020-04-04 LAB — URINE CULTURE

## 2020-05-27 ENCOUNTER — Other Ambulatory Visit: Payer: Medicaid Other

## 2020-05-28 ENCOUNTER — Other Ambulatory Visit: Payer: Medicaid Other

## 2020-05-28 ENCOUNTER — Other Ambulatory Visit: Payer: Self-pay

## 2020-05-28 DIAGNOSIS — Z20822 Contact with and (suspected) exposure to covid-19: Secondary | ICD-10-CM

## 2020-05-30 LAB — SARS-COV-2, NAA 2 DAY TAT

## 2020-05-30 LAB — NOVEL CORONAVIRUS, NAA: SARS-CoV-2, NAA: NOT DETECTED

## 2020-06-25 ENCOUNTER — Encounter (HOSPITAL_COMMUNITY): Payer: Self-pay

## 2020-06-25 ENCOUNTER — Emergency Department (HOSPITAL_BASED_OUTPATIENT_CLINIC_OR_DEPARTMENT_OTHER): Payer: Medicaid Other

## 2020-06-25 ENCOUNTER — Emergency Department (HOSPITAL_COMMUNITY): Payer: Medicaid Other

## 2020-06-25 ENCOUNTER — Emergency Department (HOSPITAL_COMMUNITY)
Admission: EM | Admit: 2020-06-25 | Discharge: 2020-06-25 | Disposition: A | Discharge status: Home / Self Care | Payer: Medicaid Other | Attending: Emergency Medicine | Admitting: Emergency Medicine

## 2020-06-25 ENCOUNTER — Other Ambulatory Visit: Payer: Self-pay

## 2020-06-25 DIAGNOSIS — R0602 Shortness of breath: Secondary | ICD-10-CM | POA: Diagnosis not present

## 2020-06-25 DIAGNOSIS — M7989 Other specified soft tissue disorders: Secondary | ICD-10-CM

## 2020-06-25 DIAGNOSIS — I809 Phlebitis and thrombophlebitis of unspecified site: Secondary | ICD-10-CM | POA: Insufficient documentation

## 2020-06-25 DIAGNOSIS — Z7901 Long term (current) use of anticoagulants: Secondary | ICD-10-CM | POA: Diagnosis not present

## 2020-06-25 DIAGNOSIS — M79602 Pain in left arm: Secondary | ICD-10-CM | POA: Diagnosis present

## 2020-06-25 DIAGNOSIS — Z86718 Personal history of other venous thrombosis and embolism: Secondary | ICD-10-CM | POA: Diagnosis not present

## 2020-06-25 DIAGNOSIS — Z20822 Contact with and (suspected) exposure to covid-19: Secondary | ICD-10-CM | POA: Insufficient documentation

## 2020-06-25 DIAGNOSIS — Z87891 Personal history of nicotine dependence: Secondary | ICD-10-CM | POA: Insufficient documentation

## 2020-06-25 LAB — RESP PANEL BY RT-PCR (FLU A&B, COVID) ARPGX2
Influenza A by PCR: NEGATIVE
Influenza B by PCR: NEGATIVE
SARS Coronavirus 2 by RT PCR: NEGATIVE

## 2020-06-25 LAB — BASIC METABOLIC PANEL
Anion gap: 3 — ABNORMAL LOW (ref 5–15)
BUN: 13 mg/dL (ref 6–20)
CO2: 31 mmol/L (ref 22–32)
Calcium: 9.2 mg/dL (ref 8.9–10.3)
Chloride: 106 mmol/L (ref 98–111)
Creatinine, Ser: 1.17 mg/dL — ABNORMAL HIGH (ref 0.44–1.00)
GFR, Estimated: >60 mL/min (ref >60)
Glucose, Bld: 106 mg/dL — ABNORMAL HIGH (ref 70–99)
Potassium: 4.5 mmol/L (ref 3.5–5.1)
Sodium: 140 mmol/L (ref 135–145)

## 2020-06-25 LAB — CBC
HCT: 41.8 % (ref 36.0–46.0)
Hemoglobin: 13.5 g/dL (ref 12.0–15.0)
MCH: 27.6 pg (ref 26.0–34.0)
MCHC: 32.3 g/dL (ref 30.0–36.0)
MCV: 85.3 fL (ref 80.0–100.0)
Platelets: 198 10*3/uL (ref 150–400)
RBC: 4.9 MIL/uL (ref 3.87–5.11)
RDW: 15 % (ref 11.5–15.5)
WBC: 3.9 10*3/uL — ABNORMAL LOW (ref 4.0–10.5)
nRBC: 0 % (ref 0.0–0.2)

## 2020-06-25 MED ORDER — APIXABAN 2.5 MG PO TABS: 2.5000 mg | ORAL_TABLET | Freq: Two times a day (BID) | ORAL | 0 refills | Status: DC

## 2020-06-25 NOTE — Discharge Instructions (Addendum)
Take your Eliquis as prescribed to prevent DVT recurrence.  Apply warm compresses and take Tylenol as needed for the discomfort associated with thrombophlebitis.

## 2020-06-25 NOTE — ED Triage Notes (Signed)
Patient c/o SOB and left arm pain x several days. Patient reports a history of DVT's

## 2020-06-25 NOTE — ED Provider Notes (Signed)
[facility]   CSN: [id number] Arrival date & time: 06/25/20  1038     History Chief Complaint  Patient presents with  . Shortness of Breath  . Arm Pain    Monica Holland Callaway is a 35 y.o. female.  HPI   Patient presented to the ED for evaluation of left arm pain and shortness of breath.  Patient states she started noticing the symptoms several days ago.  She has history of venous thromboembolism and is post to be on Eliquis.  Patient states she has not been taking that medication like she is supposed to.  Patient states she has noticed pain and swelling along the vein in the left upper extremity.  She denies any recent injuries, IVs or injections.  She also has noticed some shortness of breath.  Family told her she should get that checked out.  She has not had any chest pain.  No fevers or coughing.  No leg swelling.  Past Medical History:  Diagnosis Date  . DVT (deep venous thrombosis) (HCC)     There are no problems to display for this patient.   Past Surgical History:  Procedure Laterality Date  . ANKLE SURGERY Right Jan 2014     OB History   No obstetric history on file.     Family History  Problem Relation Age of Onset  . Deep vein thrombosis Mother   . Hyperlipidemia Mother   . Diabetes Mother   . High Cholesterol Mother     Social History   Tobacco Use  . Smoking status: Former Smoker    Types: Cigarettes  . Smokeless tobacco: Never Used  Vaping Use  . Vaping Use: Never used  Substance Use Topics  . Alcohol use: Yes    Comment: weekends (a lot)  . Drug use: Not Currently    Types: Marijuana    Home Medications Prior to Admission medications   Medication Sig Start Date End Date Taking? Authorizing Provider  apixaban (ELIQUIS) 2.5 MG TABS tablet Take 1 tablet (2.5 mg total) by mouth 2 (two) times daily. 06/25/20  Yes Linwood DibblesKnapp, Lyrah Bradt, MD  acetaminophen (TYLENOL) 500 MG tablet Take 1 tablet (500 mg total) by  mouth every 6 (six) hours as needed. Patient taking differently: Take 1,000 mg by mouth every 6 (six) hours as needed for moderate pain. 04/14/19   [name], [name], PA-C  acetaminophen-codeine (TYLENOL #3) 300-30 MG per tablet Take 1-2 tablets by mouth every 6 (six) hours as needed. Patient not taking: No sig reported 03/05/13   Derwood KaplanNanavati, Ankit, MD  albuterol (PROVENTIL HFA;VENTOLIN HFA) 108 (90 BASE) MCG/ACT inhaler Inhale 2 puffs into the lungs every 6 (six) hours as needed for wheezing or shortness of breath.    [provider]  benzonatate (TESSALON) 100 MG capsule Take 1 capsule (100 mg total) by mouth 3 (three) times daily as needed for cough. Patient not taking: No sig reported 04/14/19   Michela PitcherFawze, [name], PA-C  cyclobenzaprine (FLEXERIL) 5 MG tablet Take 1 tablet (5 mg total) by mouth 3 (three) times daily as needed for muscle spasms. Patient not taking: No sig reported 04/19/14   Earley FavorSchulz, Gail, NP  fluticasone [facility](FLONASE) 50 MCG/ACT nasal spray Place 1 spray into both nostrils daily. Patient not taking: No sig reported 03/28/18   Caccavale, Sophia, PA-C  ondansetron (ZOFRAN ODT) 4 MG disintegrating tablet Take 1 tablet (4 mg total) by mouth every 8 (eight) hours as needed for nausea or vomiting.  04/02/20   Mannie Stabile, PA-C  phenol (CHLORASEPTIC) 1.4 % LIQD Use as directed 1 spray in the mouth or throat as needed for throat irritation / pain. Patient not taking: Reported on 04/02/2020 03/28/18   Caccavale, Sophia, PA-C  Rivaroxaban 15 & 20 MG TBPK Take as directed on package: Start with one 15mg  tablet by mouth twice a day with food. On Day 22, switch to one 20mg  tablet once a day with food. Patient not taking: Reported on 04/02/2020 03/05/13 06/25/20  03/07/13, MD    Allergies    Patient has no known allergies.  Review of Systems   Review of Systems  All other systems reviewed and are negative.   Physical Exam Updated Vital Signs BP (!) 121/94   Pulse (!) 55    Temp 98.3 F (36.8 C) (Oral)   Resp 18   Ht 1.803 m (5\' 11" )   Wt 73 kg   LMP 06/18/2020   SpO2 100%   BMI 22.45 kg/m   Physical Exam Vitals and nursing note reviewed.  Constitutional:      General: She is not in acute distress.    Appearance: She is well-developed.  HENT:     Head: Normocephalic and atraumatic.     Right Ear: External ear normal.     Left Ear: External ear normal.  Eyes:     General: No scleral icterus.       Right eye: No discharge.        Left eye: No discharge.     Conjunctiva/sclera: Conjunctivae normal.  Neck:     Trachea: No tracheal deviation.  Cardiovascular:     Rate and Rhythm: Normal rate and regular rhythm.  Pulmonary:     Effort: Pulmonary effort is normal. No respiratory distress.     Breath sounds: Normal breath sounds. No stridor. No wheezing or rales.  Abdominal:     General: Bowel sounds are normal. There is no distension.     Palpations: Abdomen is soft.     Tenderness: There is no abdominal tenderness. There is no guarding or rebound.  Musculoskeletal:        General: No tenderness.     Cervical back: Neck supple.     Right lower leg: No edema.     Left lower leg: No edema.     Comments: Tenderness palpation left upper extremity along the volar aspect of the antecubital fossa, mild erythema noted and tenderness along the veins in the antecubital fossa, no distal edema noted  Skin:    General: Skin is warm and dry.     Findings: No rash.  Neurological:     Mental Status: She is alert.     Cranial Nerves: No cranial nerve deficit (no facial droop, extraocular movements intact, no slurred speech).     Sensory: No sensory deficit.     Motor: No abnormal muscle tone or seizure activity.     Coordination: Coordination normal.     ED Results / Procedures / Treatments   Labs (all labs ordered are listed, but only abnormal results are displayed) Labs Reviewed  CBC - Abnormal; Notable for the following components:      Result Value    WBC 3.9 (*)    All other components within normal limits  BASIC METABOLIC PANEL - Abnormal; Notable for the following components:   Glucose, Bld 106 (*)    Creatinine, Ser 1.17 (*)    Anion gap 3 (*)    All  other components within normal limits  RESP PANEL BY RT-PCR (FLU A&B, COVID) ARPGX2    EKG  Sinus rhythm rate 80 Normal axis, normal intervals Borderline left ventricular hypertrophy by voltage criteria Normal ST-T waves Radiology DG Chest Portable 1 View  Result Date: 06/25/2020 CLINICAL DATA:  Arm swelling and shortness of breath. History of pulmonary embolism with medication noncompliance. EXAM: PORTABLE CHEST 1 VIEW COMPARISON:  Radiographs 04/14/2019. FINDINGS: 1206 hours. The heart size and mediastinal contours are normal. The lungs are clear. There is no pleural effusion or pneumothorax. No acute osseous findings are identified. Telemetry leads overlie the chest. IMPRESSION: No active cardiopulmonary process. Electronically Signed   By: Carey Bullocks M.D.   On: 06/25/2020 12:37   UE Venous Duplex ([facility] and [facility] ONLY)  Result Date: 06/25/2020 UPPER VENOUS STUDY  Indications: Swelling Risk Factors: DVT HX of LE DVT. Comparison Study: No prior studies. Performing Technologist: Chanda Busing RVT  Examination Guidelines: A complete evaluation includes B-mode imaging, spectral Doppler, color Doppler, and power Doppler as needed of all accessible portions of each vessel. Bilateral testing is considered an integral part of a complete examination. Limited examinations for reoccurring indications may be performed as noted.  Right Findings: +----------+------------+---------+-----------+----------+-------+ RIGHT     CompressiblePhasicitySpontaneousPropertiesSummary +----------+------------+---------+-----------+----------+-------+ Subclavian    Full       Yes       Yes                      +----------+------------+---------+-----------+----------+-------+  Left Findings:  +----------+------------+---------+-----------+----------+-------+ LEFT      CompressiblePhasicitySpontaneousPropertiesSummary +----------+------------+---------+-----------+----------+-------+ IJV           Full       Yes       Yes                      +----------+------------+---------+-----------+----------+-------+ Subclavian    Full       Yes       Yes                      +----------+------------+---------+-----------+----------+-------+ Axillary      Full       Yes       Yes                      +----------+------------+---------+-----------+----------+-------+ Brachial      Full       Yes       Yes                      +----------+------------+---------+-----------+----------+-------+ Radial        Full                                          +----------+------------+---------+-----------+----------+-------+ Ulnar         Full                                          +----------+------------+---------+-----------+----------+-------+ Cephalic      None                                   Acute  +----------+------------+---------+-----------+----------+-------+ Basilic  Full                                          +----------+------------+---------+-----------+----------+-------+ Cephalic thrombus noted only in the [facility].  Summary:  Right: No evidence of thrombosis in the subclavian.  Left: No evidence of deep vein thrombosis in the upper extremity. Findings consistent with acute superficial vein thrombosis involving the left cephalic vein.  *See table(s) above for measurements and observations.    Preliminary     Procedures Procedures   Medications Ordered in ED Medications - No data to display  ED Course  I have reviewed the triage vital signs and the nursing notes.  Pertinent labs & imaging results that were available during my care of the patient were reviewed by me and considered in my medical decision making (see chart for  details).  Clinical Course as of 06/26/20 0659  Caleen Essex Jun 25, 2020  1237 Doppler study negative for DVT.  Positive for superficial thrombophlebitis. [JK]    Clinical Course User Index [JK] Linwood Dibbles, MD   MDM Rules/Calculators/A&P                         Patient presented to the ED with complaints of left arm pain.  Exam more suggestive of a superficial thrombophlebitis but patient does have history of DVT.  Doppler study performed in the ED and no evidence of DVT, study shows superficial thrombophlebitis.  Patient breathing easily.  No dyspnea.  No signs of pneumonia doubt PE.  Will refill her Eliquis.  Stable for discharge  Final Clinical Impression(s) / ED Diagnoses Final diagnoses:  Thrombophlebitis    Rx / DC Orders ED Discharge Orders         Ordered    apixaban (ELIQUIS) 2.5 MG TABS tablet  2 times daily        06/25/20 1349           Linwood Dibbles, MD 06/26/20 0700

## 2020-06-25 NOTE — Progress Notes (Signed)
Left upper extremity venous duplex has been completed. Preliminary results can be found in CV Proc through chart review.  Results were given to Dr. Lynelle Doctor.  06/25/20 12:12 PM Olen Cordial RVT

## 2020-08-02 ENCOUNTER — Emergency Department (HOSPITAL_BASED_OUTPATIENT_CLINIC_OR_DEPARTMENT_OTHER): Payer: Medicaid Other

## 2020-08-02 ENCOUNTER — Encounter (HOSPITAL_COMMUNITY): Payer: Self-pay

## 2020-08-02 ENCOUNTER — Emergency Department (HOSPITAL_COMMUNITY)
Admission: EM | Admit: 2020-08-02 | Discharge: 2020-08-02 | Disposition: A | Discharge status: Home / Self Care | Payer: Medicaid Other | Attending: Emergency Medicine | Admitting: Emergency Medicine

## 2020-08-02 ENCOUNTER — Other Ambulatory Visit: Payer: Self-pay

## 2020-08-02 DIAGNOSIS — R600 Localized edema: Secondary | ICD-10-CM | POA: Diagnosis present

## 2020-08-02 DIAGNOSIS — Z7901 Long term (current) use of anticoagulants: Secondary | ICD-10-CM | POA: Insufficient documentation

## 2020-08-02 DIAGNOSIS — I825Y2 Chronic embolism and thrombosis of unspecified deep veins of left proximal lower extremity: Secondary | ICD-10-CM | POA: Diagnosis not present

## 2020-08-02 DIAGNOSIS — M79605 Pain in left leg: Secondary | ICD-10-CM

## 2020-08-02 DIAGNOSIS — M7989 Other specified soft tissue disorders: Secondary | ICD-10-CM | POA: Diagnosis not present

## 2020-08-02 DIAGNOSIS — Z87891 Personal history of nicotine dependence: Secondary | ICD-10-CM | POA: Insufficient documentation

## 2020-08-02 LAB — CBC WITH DIFFERENTIAL/PLATELET
Abs Immature Granulocytes: 0.02 10*3/uL (ref 0.00–0.07)
Basophils Absolute: 0 10*3/uL (ref 0.0–0.1)
Basophils Relative: 1 %
Eosinophils Absolute: 0.1 10*3/uL (ref 0.0–0.5)
Eosinophils Relative: 2 %
HCT: 44 % (ref 36.0–46.0)
Hemoglobin: 14.3 g/dL (ref 12.0–15.0)
Immature Granulocytes: 0 %
Lymphocytes Relative: 34 %
Lymphs Abs: 2 10*3/uL (ref 0.7–4.0)
MCH: 27.9 pg (ref 26.0–34.0)
MCHC: 32.5 g/dL (ref 30.0–36.0)
MCV: 85.8 fL (ref 80.0–100.0)
Monocytes Absolute: 0.5 10*3/uL (ref 0.1–1.0)
Monocytes Relative: 8 %
Neutro Abs: 3.2 10*3/uL (ref 1.7–7.7)
Neutrophils Relative %: 55 %
Platelets: 225 10*3/uL (ref 150–400)
RBC: 5.13 MIL/uL — ABNORMAL HIGH (ref 3.87–5.11)
RDW: 15.9 % — ABNORMAL HIGH (ref 11.5–15.5)
WBC: 5.9 10*3/uL (ref 4.0–10.5)
nRBC: 0 % (ref 0.0–0.2)

## 2020-08-02 LAB — BASIC METABOLIC PANEL
Anion gap: 7 (ref 5–15)
BUN: 9 mg/dL (ref 6–20)
CO2: 29 mmol/L (ref 22–32)
Calcium: 9.2 mg/dL (ref 8.9–10.3)
Chloride: 104 mmol/L (ref 98–111)
Creatinine, Ser: 1.11 mg/dL — ABNORMAL HIGH (ref 0.44–1.00)
GFR, Estimated: >60 mL/min (ref >60)
Glucose, Bld: 96 mg/dL (ref 70–99)
Potassium: 4.1 mmol/L (ref 3.5–5.1)
Sodium: 140 mmol/L (ref 135–145)

## 2020-08-02 MED ORDER — APIXABAN 5 MG PO TABS: 5.0000 mg | ORAL_TABLET | Freq: Two times a day (BID) | ORAL | 0 refills | Status: DC

## 2020-08-02 MED ORDER — APIXABAN 5 MG PO TABS
5.0000 mg | ORAL_TABLET | Freq: Once | ORAL | Status: AC
  Administered 2020-08-02: 5 mg via ORAL
  Filled 2020-08-02: qty 1

## 2020-08-02 MED ORDER — APIXABAN (ELIQUIS) EDUCATION KIT FOR DVT/PE PATIENTS: PACK | Freq: Once | Status: DC

## 2020-08-02 NOTE — Progress Notes (Signed)
LLE venous duplex completed. Preliminary findings given to Mariel Sleet, PA at [phone].

## 2020-08-02 NOTE — Discharge Instructions (Addendum)
Please pick up medication and take as prescribed to cover for your chronic DVT. Please follow back up with your hematologist regarding your ED visit today. You can also follow up with your PCP. If you do not have one you can follow up with [facility] for primary care needs  You can also follow up with [facility] for OBGYN related issues including heavy menstrual cycles related to your eliquis usage  Return to the ED for any worsening symptoms

## 2020-08-02 NOTE — ED Notes (Signed)
An After Visit Summary was printed and given to the patient. Discharge instructions given and no further questions at this time.  

## 2020-08-02 NOTE — ED Triage Notes (Signed)
Patient c/o left leg swelling x 2-3 days. Patient has a history of DVT. Patient states she is not currently taking Eliquis. Patient states it has been approx 1-2 months since she last took Eliquis.

## 2020-08-02 NOTE — ED Provider Notes (Signed)
[facility]-EMERGENCY DEPT Provider Note   CSN: [id number] Arrival date & time: 08/02/20  1656     History Chief Complaint  Patient presents with  . Leg Swelling    Monica Holland is a 35 y.o. female with hx chronic DVT who presents to the ED today with complaint of gradual onset, constant, left leg swelling and pain for the past 2 days.  Patient reports she has been on and off of her Eliquis, last took approximately 1 to 2 months ago.  She states that she first noticed the left leg swelling 2 days ago and has gotten worse since then prompting her to come back to the ED today.  Denies any chest pain or shortness of breath.  She states that she has been dealing with chronic DVTs for approximately 19 years.  Denies any known clotting disorders.  Denies any precipitating events including prolonged travel/immobilization/surgeries recently.  No known leg trauma.   The history is provided by the patient and medical records.       Past Medical History:  Diagnosis Date  . DVT (deep venous thrombosis) (HCC)     There are no problems to display for this patient.   Past Surgical History:  Procedure Laterality Date  . ANKLE SURGERY Right Jan 2014     OB History   No obstetric history on file.     Family History  Problem Relation Age of Onset  . Deep vein thrombosis Mother   . Hyperlipidemia Mother   . Diabetes Mother   . High Cholesterol Mother     Social History   Tobacco Use  . Smoking status: Former Smoker    Types: Cigarettes  . Smokeless tobacco: Never Used  Vaping Use  . Vaping Use: Never used  Substance Use Topics  . Alcohol use: Yes    Comment: weekends (a lot)  . Drug use: Not Currently    Types: Marijuana    Home Medications Prior to Admission medications   Medication Sig Start Date End Date Taking? Authorizing Provider  apixaban (ELIQUIS) 5 MG TABS tablet Take 1 tablet (5 mg total) by mouth 2 (two) times daily. 08/02/20 09/01/20 Yes  Lynkin Saini, PA-C  acetaminophen (TYLENOL) 500 MG tablet Take 1 tablet (500 mg total) by mouth every 6 (six) hours as needed. Patient taking differently: Take 1,000 mg by mouth every 6 (six) hours as needed for moderate pain. 04/14/19   [name], [name], PA-C  acetaminophen-codeine (TYLENOL #3) 300-30 MG per tablet Take 1-2 tablets by mouth every 6 (six) hours as needed. Patient not taking: No sig reported 03/05/13   [name], MD  albuterol (PROVENTIL HFA;VENTOLIN HFA) 108 (90 BASE) MCG/ACT inhaler Inhale 2 puffs into the lungs every 6 (six) hours as needed for wheezing or shortness of breath.    [provider]  benzonatate (TESSALON) 100 MG capsule Take 1 capsule (100 mg total) by mouth 3 (three) times daily as needed for cough. Patient not taking: No sig reported 04/14/19   Michela Pitcher A, PA-C  cyclobenzaprine (FLEXERIL) 5 MG tablet Take 1 tablet (5 mg total) by mouth 3 (three) times daily as needed for muscle spasms. Patient not taking: No sig reported 04/19/14   [name], NP  fluticasone [facility]) 50 MCG/ACT nasal spray Place 1 spray into both nostrils daily. Patient not taking: No sig reported 03/28/18   Caccavale, Sophia, PA-C  ondansetron (ZOFRAN ODT) 4 MG disintegrating tablet Take 1 tablet (4 mg total) by mouth  every 8 (eight) hours as needed for nausea or vomiting. 04/02/20   Aberman, Merla Riches, PA-C  phenol (CHLORASEPTIC) 1.4 % LIQD Use as directed 1 spray in the mouth or throat as needed for throat irritation / pain. Patient not taking: Reported on 04/02/2020 03/28/18   Caccavale, Sophia, PA-C  Rivaroxaban 15 & 20 MG TBPK Take as directed on package: Start with one 15mg  tablet by mouth twice a day with food. On Day 22, switch to one 20mg  tablet once a day with food. Patient not taking: Reported on 04/02/2020 03/05/13 06/25/20  03/07/13, MD    Allergies    Patient has no known allergies.  Review of Systems   Review of Systems  Constitutional: Negative for  chills and fever.  Respiratory: Negative for shortness of breath.   Cardiovascular: Positive for leg swelling. Negative for chest pain.  Musculoskeletal: Positive for arthralgias.  All other systems reviewed and are negative.   Physical Exam Updated Vital Signs BP 131/90 (BP Location: Right Arm)   Pulse 66   Temp 98.5 F (36.9 C) (Oral)   Resp 16   Ht 5\' 11"  (1.803 m)   Wt 79.4 kg   LMP 07/16/2020 (Approximate)   SpO2 100%   BMI 24.41 kg/m   Physical Exam Vitals and nursing note reviewed.  Constitutional:      Appearance: She is not ill-appearing or diaphoretic.  HENT:     Head: Normocephalic and atraumatic.  Eyes:     Conjunctiva/sclera: Conjunctivae normal.  Cardiovascular:     Rate and Rhythm: Normal rate and regular rhythm.     Pulses: Normal pulses.  Pulmonary:     Effort: Pulmonary effort is normal.     Breath sounds: Normal breath sounds. No wheezing, rhonchi or rales.  Abdominal:     Palpations: Abdomen is soft.     Tenderness: There is no abdominal tenderness.  Musculoskeletal:        General: Tenderness present.     Cervical back: Neck supple.     Left lower leg: Edema present.  Skin:    General: Skin is warm and dry.  Neurological:     Mental Status: She is alert.     ED Results / Procedures / Treatments   Labs (all labs ordered are listed, but only abnormal results are displayed) Labs Reviewed  CBC WITH DIFFERENTIAL/PLATELET - Abnormal; Notable for the following components:      Result Value   RBC 5.13 (*)    RDW 15.9 (*)    All other components within normal limits  BASIC METABOLIC PANEL - Abnormal; Notable for the following components:   Creatinine, Ser 1.11 (*)    All other components within normal limits    EKG None  Radiology VAS [name] LOWER EXTREMITY VENOUS (DVT) ([facility] 7a-7p)  Result Date: 08/02/2020  Lower Venous DVT Study Indications: LLE pain and swelling.  Risk Factors: HX of PE DVT Hx of LLE DVT, and LUE SVT Patient quit  taking Eliquis on own accord. Comparison Study: Previous exam 04/02/20 - Chronic DVT LLE popliteal Performing Technologist: US  Examination Guidelines: A complete evaluation includes B-mode imaging, spectral Doppler, color Doppler, and power Doppler as needed of all accessible portions of each vessel. Bilateral testing is considered an integral part of a complete examination. Limited examinations for reoccurring indications may be performed as noted. The reflux portion of the exam is performed with the patient in reverse Trendelenburg.  +---------+---------------+---------+-----------+----------+--------------+ RIGHT    CompressibilityPhasicitySpontaneityPropertiesThrombus Aging +---------+---------------+---------+-----------+----------+--------------+  CFV      Full           Yes      Yes                                 +---------+---------------+---------+-----------+----------+--------------+ SFJ      Full                                                        +---------+---------------+---------+-----------+----------+--------------+ FV Prox  Full           Yes      Yes                                 +---------+---------------+---------+-----------+----------+--------------+ FV Mid   Full           Yes      Yes                                 +---------+---------------+---------+-----------+----------+--------------+ FV DistalFull           Yes      Yes                                 +---------+---------------+---------+-----------+----------+--------------+ PFV      Full                                                        +---------+---------------+---------+-----------+----------+--------------+ POP      Partial        Yes      Yes                  Chronic        +---------+---------------+---------+-----------+----------+--------------+ PTV      Full                                                         +---------+---------------+---------+-----------+----------+--------------+ PERO     Full                                                        +---------+---------------+---------+-----------+----------+--------------+   +----+---------------+---------+-----------+----------+--------------+ LEFTCompressibilityPhasicitySpontaneityPropertiesThrombus Aging +----+---------------+---------+-----------+----------+--------------+ CFV Full           Yes      Yes                                 +----+---------------+---------+-----------+----------+--------------+     Summary: RIGHT: - No evidence of common femoral vein obstruction. - Ultrasound characteristics of enlarged lymph nodes are noted in the  groin.  LEFT: - Findings consistent with chronic deep vein thrombosis involving the left popliteal vein. - There is no evidence of superficial venous thrombosis.  - Ultrasound characteristics of enlarged lymph nodes noted in the groin.  *See table(s) above for measurements and observations.    Preliminary     Procedures Procedures   Medications Ordered in ED Medications  apixaban (ELIQUIS) tablet 5 mg (has no administration in time range)    ED Course  I have reviewed the triage vital signs and the nursing notes.  Pertinent labs & imaging results that were available during my care of the patient were reviewed by me and considered in my medical decision making (see chart for details).    MDM Rules/Calculators/A&P                          35 year old female with history of chronic DVTs who presents to the ED today complaining of left leg swelling/pain for the past 2 days.  Has been off of her Eliquis for approximately 1 to 2 months.  No chest pain or shortness of breath.  On arrival to the ED vitals are stable.  Patient appears to be in no acute distress.  She does have obvious edema and tenderness to palpation to the calf on the left leg.  Will need an ultrasound.  We will plan for lab  work at this time in case she needs to be started back on her Eliquis.  She does report that she knows how important her Eliquis is however states that she is tired of bruising easily and states that her periods are very heavy.  She is not on oral birth control pills and has not spoken with an OB/GYN regarding heavy menstrual cycle related to Eliquis.  CBC without leukocytosis. Hgb stable at 14.3 BMP with stable creatinine 1.11. No other electrolyte abnormalities  DVT study with chronic DVT. No changes. Will restart patient on eliquis at this time. I do not feel she requires admission today. Have discussed case with pharmacist today - it does appear pt was on 2.5 mg BID Eliquis in the past. It appears we saw her in December and started her on the typical 10 mg and then 5 mg BID [place]. We saw her again in March and at that time it was reported that she was on 2.5 mg BID and her medication was refilled. Pharmacy cannot see why patient was decreased however does not believe 2.5 mg is adequate; recommends 5 mg Bid at this time. Will represcribe for patient. Will also provide info for OBGYN to discuss heavy menstrual cycles on eliquis. She is in agreement with plan and stable for discharge home. Pt is requesting crutches given she is having pain with ambulation; will provide.   This note was prepared using Dragon voice recognition software and may include unintentional dictation errors due to the inherent limitations of voice recognition software.  Final Clinical Impression(s) / ED Diagnoses Final diagnoses:  Chronic deep vein thrombosis (DVT) of proximal vein of left lower extremity (HCC)    Rx / DC Orders ED Discharge Orders         Ordered    apixaban (ELIQUIS) 5 MG TABS tablet  2 times daily        08/02/20 1953           Discharge Instructions     Please pick up medication and take as prescribed to cover for your chronic DVT.  Please follow back up with your hematologist regarding  your ED visit today. You can also follow up with your PCP. If you do not have one you can follow up with [facility] for primary care needs  You can also follow up with the [facility] for OBGYN related issues including heavy menstrual cycles related to your eliquis usage  Return to the ED for any worsening symptoms       Tanda Rockers, PA-C 08/02/20 1953    Linwood Dibbles, MD 08/02/20 2352

## 2020-11-11 ENCOUNTER — Other Ambulatory Visit: Payer: Self-pay

## 2020-11-11 ENCOUNTER — Encounter (HOSPITAL_COMMUNITY): Payer: Self-pay | Admitting: Emergency Medicine

## 2020-11-11 ENCOUNTER — Ambulatory Visit (HOSPITAL_COMMUNITY)
Admission: EM | Admit: 2020-11-11 | Discharge: 2020-11-11 | Disposition: A | Discharge status: Home / Self Care | Payer: Medicaid Other | Attending: Internal Medicine | Admitting: Internal Medicine

## 2020-11-11 DIAGNOSIS — Z28310 Unvaccinated for covid-19: Secondary | ICD-10-CM | POA: Insufficient documentation

## 2020-11-11 DIAGNOSIS — J029 Acute pharyngitis, unspecified: Secondary | ICD-10-CM | POA: Diagnosis not present

## 2020-11-11 DIAGNOSIS — Z20822 Contact with and (suspected) exposure to covid-19: Secondary | ICD-10-CM | POA: Diagnosis not present

## 2020-11-11 DIAGNOSIS — Z87891 Personal history of nicotine dependence: Secondary | ICD-10-CM | POA: Insufficient documentation

## 2020-11-11 DIAGNOSIS — R197 Diarrhea, unspecified: Secondary | ICD-10-CM | POA: Insufficient documentation

## 2020-11-11 DIAGNOSIS — R059 Cough, unspecified: Secondary | ICD-10-CM | POA: Insufficient documentation

## 2020-11-11 LAB — BASIC METABOLIC PANEL
Anion gap: 10 (ref 5–15)
BUN: 9 mg/dL (ref 6–20)
CO2: 24 mmol/L (ref 22–32)
Calcium: 8.7 mg/dL — ABNORMAL LOW (ref 8.9–10.3)
Chloride: 105 mmol/L (ref 98–111)
Creatinine, Ser: 0.94 mg/dL (ref 0.44–1.00)
GFR, Estimated: >60 mL/min (ref >60)
Glucose, Bld: 93 mg/dL (ref 70–99)
Potassium: 4.8 mmol/L (ref 3.5–5.1)
Sodium: 139 mmol/L (ref 135–145)

## 2020-11-11 LAB — CBC WITH DIFFERENTIAL/PLATELET
Abs Immature Granulocytes: 0.01 10*3/uL (ref 0.00–0.07)
Basophils Absolute: 0 10*3/uL (ref 0.0–0.1)
Basophils Relative: 1 %
Eosinophils Absolute: 0.1 10*3/uL (ref 0.0–0.5)
Eosinophils Relative: 2 %
HCT: 43.8 % (ref 36.0–46.0)
Hemoglobin: 14.4 g/dL (ref 12.0–15.0)
Immature Granulocytes: 0 %
Lymphocytes Relative: 27 %
Lymphs Abs: 1.8 10*3/uL (ref 0.7–4.0)
MCH: 27.4 pg (ref 26.0–34.0)
MCHC: 32.9 g/dL (ref 30.0–36.0)
MCV: 83.4 fL (ref 80.0–100.0)
Monocytes Absolute: 0.5 10*3/uL (ref 0.1–1.0)
Monocytes Relative: 8 %
Neutro Abs: 4.1 10*3/uL (ref 1.7–7.7)
Neutrophils Relative %: 62 %
Platelets: 252 10*3/uL (ref 150–400)
RBC: 5.25 MIL/uL — ABNORMAL HIGH (ref 3.87–5.11)
RDW: 16.4 % — ABNORMAL HIGH (ref 11.5–15.5)
WBC: 6.6 10*3/uL (ref 4.0–10.5)
nRBC: 0 % (ref 0.0–0.2)

## 2020-11-11 LAB — SARS CORONAVIRUS 2 (TAT 6-24 HRS): SARS Coronavirus 2: NEGATIVE

## 2020-11-11 NOTE — Discharge Instructions (Addendum)
Increase oral fluid intake We will call you with recommendations if your labs are abnormal Return to urgent care if symptoms worsen If diarrhea continues to be persistent you may benefit from gastroenterology evaluation.

## 2020-11-11 NOTE — ED Notes (Signed)
Pt did not answered the phone.  

## 2020-11-11 NOTE — ED Triage Notes (Signed)
Pt is present today with diarrhea, sore throat, and cough. Pt states that her sx started x2 weeks ago

## 2020-11-11 NOTE — ED Provider Notes (Signed)
[facility]    CSN: [id number] Arrival date & time: 11/11/20  1227      History   Chief Complaint Chief Complaint  Patient presents with   Cough   Sore Throat   Diarrhea    HPI Edit Monica Holland is a 35 y.o. female comes to the urgent care with 2-week history of diarrhea, cough and then sore throat.  Patient says symptoms started insidiously and has been persistent.  Patient denies any fever.  Diarrheal bowel movements are not bloody or mucoid.  She averages 3 watery bowel movements a day.  No abdominal pain or distention.  No weight loss.  Oral intake is maintained.  Patient denies any nausea or vomiting.  No sick contacts.  Patient is not vaccinated against COVID-19 virus.Marland Kitchen   HPI  Past Medical History:  Diagnosis Date   DVT (deep venous thrombosis) (HCC)     There are no problems to display for this patient.   Past Surgical History:  Procedure Laterality Date   ANKLE SURGERY Right Jan 2014    OB History   No obstetric history on file.      Home Medications    Prior to Admission medications   Medication Sig Start Date End Date Taking? Authorizing Provider  apixaban (ELIQUIS) 5 MG TABS tablet Take 1 tablet (5 mg total) by mouth 2 (two) times daily. 08/02/20 09/01/20  Tanda Rockers, PA-C  Rivaroxaban 15 & 20 MG TBPK Take as directed on package: Start with one 15mg  tablet by mouth twice a day with food. On Day 22, switch to one 20mg  tablet once a day with food. Patient not taking: Reported on 04/02/2020 03/05/13 06/25/20  03/07/13, MD    Family History Family History  Problem Relation Age of Onset   Deep vein thrombosis Mother    Hyperlipidemia Mother    Diabetes Mother    High Cholesterol Mother     Social History Social History   Tobacco Use   Smoking status: Former    Types: Cigarettes   Smokeless tobacco: Never  Vaping Use   Vaping Use: Never used  Substance Use Topics   Alcohol use: Yes    Comment: weekends (a lot)   Drug use:  Not Currently    Types: Marijuana     Allergies   Patient has no known allergies.   Review of Systems Review of Systems  Constitutional: Negative.   HENT: Negative.    Cardiovascular: Negative.   Gastrointestinal:  Positive for diarrhea. Negative for abdominal pain, nausea, rectal pain and vomiting.  Musculoskeletal: Negative.     Physical Exam Triage Vital Signs ED Triage Vitals  Enc Vitals Group     BP 11/11/20 1343 116/81     Pulse Rate 11/11/20 1343 77     Resp 11/11/20 1343 17     Temp 11/11/20 1343 98.2 F (36.8 C)     Temp Source 11/11/20 1343 Oral     SpO2 11/11/20 1343 98 %     Weight --      Height --      Head Circumference --      Peak Flow --      Pain Score 11/11/20 1341 9     Pain Loc --      Pain Edu? --      Excl. in GC? --    No data found.  Updated Vital Signs BP 116/81   Pulse 77   Temp 98.2 F (36.8 C) (Oral)  Resp 17   SpO2 98%   Visual Acuity Right Eye Distance:   Left Eye Distance:   Bilateral Distance:    Right Eye Near:   Left Eye Near:    Bilateral Near:     Physical Exam Vitals and nursing note reviewed.  Constitutional:      General: She is not in acute distress.    Appearance: She is not ill-appearing, toxic-appearing or diaphoretic.  HENT:     Right Ear: Tympanic membrane normal.     Left Ear: Tympanic membrane normal.     Mouth/Throat:     Tonsils: No tonsillar exudate or tonsillar abscesses. 0 on the right. 0 on the left.  Eyes:     Extraocular Movements:     Right eye: Normal extraocular motion.     Left eye: Normal extraocular motion.  Neck:     Thyroid: No thyromegaly.  Cardiovascular:     Rate and Rhythm: Normal rate and regular rhythm.  Pulmonary:     Effort: Pulmonary effort is normal. No respiratory distress.     Breath sounds: Normal breath sounds. No wheezing or rhonchi.  Lymphadenopathy:     Cervical: No cervical adenopathy.  Neurological:     Mental Status: She is alert.     UC Treatments  / Results  Labs (all labs ordered are listed, but only abnormal results are displayed) Labs Reviewed  SARS CORONAVIRUS 2 (TAT 6-24 HRS)  CBC WITH DIFFERENTIAL/PLATELET  BASIC METABOLIC PANEL    EKG   Radiology No results found.  Procedures Procedures (including critical care time)  Medications Ordered in UC Medications - No data to display  Initial Impression / Assessment and Plan / UC Course  I have reviewed the triage vital signs and the nursing notes.  Pertinent labs & imaging results that were available during my care of the patient were reviewed by me and considered in my medical decision making (see chart for details).    1.  Diarrhea: COVID-19 PCR test has been sent CBC, BMP Increase oral fluid intake We will call patient with recommendations if labs are abnormal. Patient has been out of work since last Tuesday and was requesting me to give him another note to cover him until Monday.  Patient is clinically stable at this time. Return precautions given. Final Clinical Impressions(s) / UC Diagnoses   Final diagnoses:  Diarrhea, unspecified type     Discharge Instructions      Increase oral fluid intake We will call you with recommendations if your labs are abnormal Return to urgent care if symptoms worsen If diarrhea continues to be persistent you may benefit from gastroenterology evaluation.   ED Prescriptions   None    PDMP not reviewed this encounter.   Merrilee Jansky, MD 11/11/20 [phone]

## 2021-01-07 ENCOUNTER — Ambulatory Visit: Payer: Medicaid Other | Admitting: Family Medicine

## 2021-01-19 ENCOUNTER — Other Ambulatory Visit: Payer: Self-pay | Admitting: Urgent Care

## 2021-01-19 DIAGNOSIS — N921 Excessive and frequent menstruation with irregular cycle: Secondary | ICD-10-CM

## 2021-02-05 DIAGNOSIS — Z86718 Personal history of other venous thrombosis and embolism: Secondary | ICD-10-CM | POA: Insufficient documentation

## 2021-02-11 ENCOUNTER — Ambulatory Visit
Admission: RE | Admit: 2021-02-11 | Discharge: 2021-02-11 | Disposition: A | Discharge status: Home / Self Care | Payer: Medicaid Other | Source: Ambulatory Visit | Attending: Urgent Care | Admitting: Urgent Care

## 2021-02-11 ENCOUNTER — Other Ambulatory Visit: Payer: Self-pay

## 2021-02-11 DIAGNOSIS — N921 Excessive and frequent menstruation with irregular cycle: Secondary | ICD-10-CM

## 2021-02-21 ENCOUNTER — Other Ambulatory Visit: Payer: Self-pay

## 2021-02-21 DIAGNOSIS — M7989 Other specified soft tissue disorders: Secondary | ICD-10-CM

## 2021-03-14 NOTE — Progress Notes (Signed)
[facility] [place]  ASSESSMENT / PLAN: 35 y.o. female with chronic venous insufficiency of left lower extremity causing edema and post thrombotic syndrome (C3 disease).  I counseled her against the pursuing intervention across joint spaces for chronic deep venous thrombosis (i.e common femoral vein stenting). May-Thurner syndrome, but I see no evidence of this on CT scan from 2021.  She does have prominent venous collaterals about her genitals and pubis, but none in her deeper pelvis.  I do not see a clear compression point in the left hemipelvis from the overriding iliac artery.  Recommend compression, elevation as she has been doing.  We will arrange for outpatient physical therapy for problems with mobility.  She has been told that she needs indefinite anticoagulation.  I counseled her to see a hematologist before committing to lifelong anticoagulation.  Follow-up with me as needed  CHIEF COMPLAINT: Left leg swollen, recurrent venous ulcers  HISTORY OF PRESENT ILLNESS: Monica Holland is a 35 y.o. 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  Patient has problems with chronic swelling and fairly typical symptoms of post thrombotic syndrome (pain, fatigue, intolerance of cold).  Symptoms have limited her ability to work.  She is hopeful that an intervention will make her leg feel better again.  We reviewed her previous findings and we had a long discussion about options for endovascular intervention.  Past Medical History:  Diagnosis Date   DVT (deep venous thrombosis) (HCC)     Past Surgical History:  Procedure Laterality Date   ANKLE SURGERY Right Jan 2014    Family History  Problem Relation Age of Onset   Deep vein thrombosis Mother    Hyperlipidemia Mother    Diabetes Mother    High Cholesterol Mother     Social History   Socioeconomic History   Marital status:  Single    Spouse name: Not on file   Number of children: Not on file   Years of education: Not on file   Highest education level: Not on file  Occupational History   Not on file  Tobacco Use   Smoking status: Former    Types: Cigarettes   Smokeless tobacco: Never  Vaping Use   Vaping Use: Never used  Substance and Sexual Activity   Alcohol use: Yes    Comment: weekends (a lot)   Drug use: Not Currently    Types: Marijuana   Sexual activity: Yes    Birth control/protection: None  Other Topics Concern   Not on file  Social History Narrative   Not on file   Social Determinants of Health   Financial Resource Strain: Not on file  Food Insecurity: Not on file  Transportation Needs: Not on file  Physical Activity: Not on file  Stress: Not on file  Social Connections: Not on file  Intimate Partner Violence: Not on file    No Known Allergies  Current Outpatient Medications  Medication Sig Dispense Refill   apixaban (ELIQUIS) 5 MG TABS tablet Take 1 tablet (5 mg total) by mouth 2 (two) times daily. 60 tablet 0   No current facility-administered medications for this visit.    REVIEW OF SYSTEMS:  [X]  denotes positive finding, [ ]  denotes negative finding Cardiac  Comments:  Chest pain or chest pressure:    Shortness of breath upon exertion:    Short of breath when lying flat:    Irregular heart  rhythm:        Vascular    Pain in calf, thigh, or hip brought on by ambulation:    Pain in feet at night that wakes you up from your sleep:     Blood clot in your veins:    Leg swelling:         Pulmonary    Oxygen at home:    Productive cough:     Wheezing:         Neurologic    Sudden weakness in arms or legs:     Sudden numbness in arms or legs:     Sudden onset of difficulty speaking or slurred speech:    Temporary loss of vision in one eye:     Problems with dizziness:         Gastrointestinal    Blood in stool:     Vomited blood:         Genitourinary     Burning when urinating:     Blood in urine:        Psychiatric    Major depression:         Hematologic    Bleeding problems:    Problems with blood clotting too easily:        Skin    Rashes or ulcers:        Constitutional    Fever or chills:      PHYSICAL EXAM Vitals:   03/15/21 1501  BP: 110/79  Pulse: 92  Resp: 20  Temp: 98 F (36.7 C)  SpO2: 97%  Weight: 173 lb (78.5 kg)  Height: 5\' 11"  (1.803 m)    Constitutional: well appearing. no distress. Appears well nourished.  Neurologic: CN intact. no focal findings. no sensory loss. Psychiatric:  Mood and affect symmetric and appropriate. Eyes:  No icterus. No conjunctival pallor. Ears, nose, throat:  mucous membranes moist. Midline trachea.  Cardiac: regular rate and rhythm.  Respiratory:  unlabored. Abdominal:  soft, non-tender, non-distended.  Peripheral vascular: 2+ DP pulses. Healed venous ulcer over left medial malleoulus. Left medial calf varicosity.  Extremity: 1+ LLE edema. No cyanosis. No pallor.  Skin: No gangrene. No ulceration.  Lymphatic: No Stemmer's sign. No palpable lymphadenopathy.  PERTINENT LABORATORY AND RADIOLOGIC DATA  Most recent CBC CBC Latest Ref Rng & Units 11/11/2020 08/02/2020 06/25/2020  WBC 4.0 - 10.5 K/uL 6.6 5.9 3.9(L)  Hemoglobin 12.0 - 15.0 g/dL 14.4 14.3 13.5  Hematocrit 36.0 - 46.0 % 43.8 44.0 41.8  Platelets 150 - 400 K/uL 252 225 198     Most recent CMP CMP Latest Ref Rng & Units 11/11/2020 08/02/2020 06/25/2020  Glucose 70 - 99 mg/dL 93 96 106(H)  BUN 6 - 20 mg/dL 9 9 13   Creatinine 0.44 - 1.00 mg/dL 0.94 1.11(H) 1.17(H)  Sodium 135 - 145 mmol/L 139 140 140  Potassium 3.5 - 5.1 mmol/L 4.8 4.1 4.5  Chloride 98 - 111 mmol/L 105 104 106  CO2 22 - 32 mmol/L 24 29 31   Calcium 8.9 - 10.3 mg/dL 8.7(L) 9.2 9.2  Total Protein 6.5 - 8.1 g/dL - - -  Total Bilirubin 0.3 - 1.2 mg/dL - - -  Alkaline Phos 38 - 126 U/L - - -  AST 15 - 41 U/L - - -  ALT 0 - 44 U/L - - -    Renal  function CrCl cannot be calculated (Patient's most recent lab result is older than the maximum 21 days allowed.).  No results  found for: HGBA1C  LDL Cholesterol  Date Value Ref Range Status  07/23/2007   Final   53        Total Cholesterol/HDL:CHD Risk Coronary Heart Disease Risk Table                     Men   Women  1/2 Average Risk   3.4   3.3     Previous venous duplex RIGHT:  - No evidence of common femoral vein obstruction.  - Ultrasound characteristics of enlarged lymph nodes are noted in the  groin.     LEFT:  - Findings consistent with chronic deep vein thrombosis involving the left  popliteal vein.  - There is no evidence of superficial venous thrombosis.     - Ultrasound characteristics of enlarged lymph nodes noted in the groin.   Rande Brunt. Lenell Antu, MD [facility] Phone Number: [phone] 03/14/2021 9:28 PM  Total time spent on preparing this encounter including chart review, data review, collecting history, examining the patient, coordinating care for this new patient, 60 minutes.  Portions of this report may have been transcribed using voice recognition software.  Every effort has been made to ensure accuracy; however, inadvertent computerized transcription errors may still be present.

## 2021-03-15 ENCOUNTER — Ambulatory Visit (HOSPITAL_COMMUNITY)
Admission: RE | Admit: 2021-03-15 | Discharge: 2021-03-15 | Disposition: A | Discharge status: Home / Self Care | Payer: Medicaid Other | Source: Ambulatory Visit | Attending: Vascular Surgery | Admitting: Vascular Surgery

## 2021-03-15 ENCOUNTER — Encounter: Payer: Self-pay | Admitting: Vascular Surgery

## 2021-03-15 ENCOUNTER — Ambulatory Visit (INDEPENDENT_AMBULATORY_CARE_PROVIDER_SITE_OTHER): Payer: Medicaid Other | Admitting: Vascular Surgery

## 2021-03-15 ENCOUNTER — Other Ambulatory Visit: Payer: Self-pay

## 2021-03-15 VITALS — BP 110/79 | HR 92 | Temp 98.0°F | Resp 20 | Ht 71.0 in | Wt 173.0 lb

## 2021-03-15 DIAGNOSIS — M7989 Other specified soft tissue disorders: Secondary | ICD-10-CM | POA: Diagnosis present

## 2021-03-15 DIAGNOSIS — I872 Venous insufficiency (chronic) (peripheral): Secondary | ICD-10-CM | POA: Diagnosis not present

## 2021-03-17 ENCOUNTER — Telehealth: Payer: Self-pay | Admitting: Physician Assistant

## 2021-03-17 NOTE — Telephone Encounter (Signed)
Scheduled appt per 12/1 referral. Pt is aware of appt date and time.  

## 2021-03-24 ENCOUNTER — Ambulatory Visit: Payer: Self-pay

## 2021-03-29 ENCOUNTER — Telehealth: Payer: Self-pay | Admitting: Oncology

## 2021-03-29 NOTE — Telephone Encounter (Signed)
R/s pt's new hem appt due to Georga Kaufmann being out of the office. Pt is aware of new appt date and time.

## 2021-03-30 ENCOUNTER — Inpatient Hospital Stay: Payer: Medicaid Other | Admitting: Physician Assistant

## 2021-03-30 ENCOUNTER — Inpatient Hospital Stay: Payer: Medicaid Other

## 2021-04-08 ENCOUNTER — Other Ambulatory Visit: Payer: Self-pay

## 2021-04-08 ENCOUNTER — Inpatient Hospital Stay: Payer: Medicaid Other | Attending: Physician Assistant | Admitting: Oncology

## 2021-04-08 VITALS — BP 113/76 | HR 77 | Temp 98.1°F | Resp 20 | Wt 178.0 lb

## 2021-04-08 DIAGNOSIS — N924 Excessive bleeding in the premenopausal period: Secondary | ICD-10-CM

## 2021-04-08 DIAGNOSIS — D6859 Other primary thrombophilia: Secondary | ICD-10-CM | POA: Insufficient documentation

## 2021-04-08 DIAGNOSIS — Z7901 Long term (current) use of anticoagulants: Secondary | ICD-10-CM | POA: Diagnosis not present

## 2021-04-08 DIAGNOSIS — I82512 Chronic embolism and thrombosis of left femoral vein: Secondary | ICD-10-CM | POA: Diagnosis not present

## 2021-04-08 DIAGNOSIS — I872 Venous insufficiency (chronic) (peripheral): Secondary | ICD-10-CM | POA: Diagnosis not present

## 2021-04-08 MED ORDER — APIXABAN 5 MG PO TABS: 5.0000 mg | ORAL_TABLET | Freq: Two times a day (BID) | ORAL | 4 refills | Status: DC

## 2021-04-08 NOTE — Progress Notes (Signed)
Reason for the request:    Deep vein thrombosis  HPI: I was asked by Dr. Stanford Breed to evaluate Ms. Topping for history of deep vein thrombosis.  She is a 35 year old woman with a recurrent deep vein thrombosis with previous episodes dating back to 2004 as well as 2018.  These thrombosis episodes are involving the left lower extremity and associated with her latest pregnancy.  She has been on and off anticoagulation in between but has been off of it till 2021.  She presented in December 2021 with bilateral lower extremity swelling.  She was found to have left chronic deep vein thrombosis involving the left popliteal vein.  She was started on Eliquis and has been on it since that time.  She was evaluated by vascular surgery for chronic venous insufficiency of the left lower extremity causing edema and post thrombotic syndrome.  She was evaluated for possible intervention and stenting but d was not felt that was helpful and no evidence of May-Thurner syndrome.  Repeat ultrasound of the lower extremity showed a chronic deep vein thrombosis involving the left common femoral vein left popliteal vein.  No acute thrombosis noted. Hypercoagulable panel obtained in 2009 showed no inherited thrombophilia.  No evidence of lupus anticoagulant or antiphospholipid antibody syndrome.  Factor V Leiden mutation as well as prothrombin gene mutation were normal.  Protein S and protein C also within normal range.   Clinically, she reports chronic left lower extremity pain and swelling but not interfering with her function.  She denies any complications related to Eliquis other than heavy menstrual cycles at times.  She denies any other complaints.    She does not report any headaches, blurry vision, syncope or seizures. Does not report any fevers, chills or sweats.  Does not report any cough, wheezing or hemoptysis.  Does not report any chest pain, palpitation, orthopnea or leg edema.  Does not report any nausea, vomiting or  abdominal pain.  Does not report any constipation or diarrhea.  Does not report any skeletal complaints.    Does not report frequency, urgency or hematuria.  Does not report any skin rashes or lesions. Does not report any heat or cold intolerance.  Does not report any lymphadenopathy or petechiae.  Does not report any anxiety or depression.  Remaining review of systems is negative.     Past Medical History:  Diagnosis Date   DVT (deep venous thrombosis) ([place])   :   Past Surgical History:  Procedure Laterality Date   ANKLE SURGERY Right Jan 2014  :   Current Outpatient Medications:    apixaban (ELIQUIS) 5 MG TABS tablet, Take 1 tablet (5 mg total) by mouth 2 (two) times daily. (Patient not taking: Reported on 03/15/2021), Disp: 60 tablet, Rfl: 0:  No Known Allergies:   Family History  Problem Relation Age of Onset   Deep vein thrombosis Mother    Hyperlipidemia Mother    Diabetes Mother    High Cholesterol Mother   :   Social History   Socioeconomic History   Marital status: Single    Spouse name: Not on file   Number of children: Not on file   Years of education: Not on file   Highest education level: Not on file  Occupational History   Not on file  Tobacco Use   Smoking status: Former    Types: Cigarettes   Smokeless tobacco: Never  Vaping Use   Vaping Use: Never used  Substance and Sexual Activity   Alcohol  use: Yes    Comment: weekends (a lot)   Drug use: Not Currently    Types: Marijuana   Sexual activity: Yes    Birth control/protection: None  Other Topics Concern   Not on file  Social History Narrative   Not on file   Social Determinants of Health   Financial Resource Strain: Not on file  Food Insecurity: Not on file  Transportation Needs: Not on file  Physical Activity: Not on file  Stress: Not on file  Social Connections: Not on file  Intimate Partner Violence: Not on file  :  Pertinent items are noted in HPI.  Exam: Blood pressure  113/76, pulse 77, temperature 98.1 F (36.7 C), resp. rate 20, weight 178 lb (80.7 kg), SpO2 100 %. ECOG 1  General appearance: alert and cooperative appeared without distress. Head: atraumatic without any abnormalities. Eyes: conjunctivae/corneas clear. PERRL.  Sclera anicteric. Throat: lips, mucosa, and tongue normal; without oral thrush or ulcers. Resp: clear to auscultation bilaterally without rhonchi, wheezes or dullness to percussion. Cardio: regular rate and rhythm, S1, S2 normal, no murmur, click, rub or gallop GI: soft, non-tender; bowel sounds normal; no masses,  no organomegaly Skin: Skin color, texture, turgor normal. No rashes or lesions Lymph nodes: Cervical, supraclavicular, and axillary nodes normal. Neurologic: Grossly normal without any motor, sensory or deep tendon reflexes. Musculoskeletal: No joint deformity or effusion.   VAS [place] LOWER EXTREMITY VENOUS (DVT)  Result Date: 03/15/2021  Lower Venous DVT Study Patient Name:  Monica Holland  Date of Exam:   03/15/2021 Medical Rec #: [id number]         Accession #:    [id number] Date of Birth: 02-Jan-1986        Patient Gender: F Patient Age:   53 years Exam Location:  [facility] Procedure:      VAS [place] LOWER EXTREMITY VENOUS (DVT) Referring Phys: Heath Lark --------------------------------------------------------------------------------  Indications: Swelling, and Pain. Other Indications: History of deep vein thrombosis in the left lower extremity. Anticoagulation: Patient was taking Eliquis up until a couple months ago.  Comparison Study: 08/02/2020: Lt- Chronic deep vein thrombosis in the popliteal                   vein. No evidence of superficial venous thrombosis. Performing Technologist: Ethelle Lyon  Examination Guidelines: A complete evaluation includes B-mode imaging, spectral Doppler, color Doppler, and power Doppler as needed of all accessible portions of each vessel. Bilateral testing is considered  an integral part of a complete examination. Limited examinations for reoccurring indications may be performed as noted. The reflux portion of the exam is performed with the patient in reverse Trendelenburg.  +-----+---------------+---------+-----------+----------+--------------+  RIGHT Compressibility Phasicity Spontaneity Properties Thrombus Aging  +-----+---------------+---------+-----------+----------+--------------+  CFV   Full            Yes       Yes                                    +-----+---------------+---------+-----------+----------+--------------+   +---------+---------------+---------+-----------+----------+--------------+  LEFT      Compressibility Phasicity Spontaneity Properties Thrombus Aging  +---------+---------------+---------+-----------+----------+--------------+  CFV       Partial         Yes       Yes                    Chronic         +---------+---------------+---------+-----------+----------+--------------+  SFJ       Full                                                             +---------+---------------+---------+-----------+----------+--------------+  FV Prox   Partial                                          Chronic         +---------+---------------+---------+-----------+----------+--------------+  FV Mid    Partial         Yes       Yes                    Chronic         +---------+---------------+---------+-----------+----------+--------------+  FV Distal Partial                                          Chronic         +---------+---------------+---------+-----------+----------+--------------+  PFV       Full                                                             +---------+---------------+---------+-----------+----------+--------------+  POP       Partial         No        Yes                    Chronic         +---------+---------------+---------+-----------+----------+--------------+  PTV       Full                                                              +---------+---------------+---------+-----------+----------+--------------+  PERO      Full                                                             +---------+---------------+---------+-----------+----------+--------------+  Gastroc   Full                                                             +---------+---------------+---------+-----------+----------+--------------+  GSV       Full                                                             +---------+---------------+---------+-----------+----------+--------------+  SSV       Full                                                             +---------+---------------+---------+-----------+----------+--------------+ Venous reflux noted in the popliteal vein.    Summary: RIGHT: - No evidence of common femoral vein obstruction.  LEFT: - Findings consistent with chronic deep vein thrombosis involving the left common femoral vein, left femoral vein, and left popliteal vein. - No cystic structure found in the popliteal fossa.  *See table(s) above for measurements and observations. Electronically signed by Jamelle Haring on 03/15/2021 at 5:01:59 PM.    Final     Assessment and Plan:    35 year old with:  1.  Recurrent venous thromboembolism involving the left lower extremity causing venous insufficiency and edema.  Her initial thrombosis was 2004 and 2018.  She is currently on Eliquis for long-term anticoagulation.  Hypercoagulable panel obtained in 2009 showed normal inherited or acquired thrombophilia.  Management options moving forward were discussed at this time.  I do not see any indication for additional anticoagulation other than Eliquis.  I believe long-term anticoagulation is recommended given her risk of recurrent thrombosis.  The etiology remains unclear could be related to an anatomical obstruction in that vein which predisposes her to recurrent thrombosis.  I recommended continued with anticoagulation at this time unless there is a reason  not to in the future.   2.  Venous insufficiency and edema of the left lower extremity: She has been evaluated with vascular surgery and no intervention is warranted.  3.  Heavy menstrual cycles: I recommended referral to gynecology for management of this issue.  4.  Follow-up: I am happy to see her in the future as needed.   45  minutes were dedicated to this visit. The time was spent on reviewing laboratory data, imaging studies, discussing treatment options, discussing differential diagnosis and answering questions regarding future plan.      A copy of this consult has been forwarded to the requesting physician.

## 2021-04-26 ENCOUNTER — Other Ambulatory Visit: Payer: Self-pay

## 2021-04-26 ENCOUNTER — Ambulatory Visit: Payer: Medicaid Other | Attending: Vascular Surgery

## 2021-04-26 DIAGNOSIS — Z86718 Personal history of other venous thrombosis and embolism: Secondary | ICD-10-CM | POA: Insufficient documentation

## 2021-04-26 DIAGNOSIS — R600 Localized edema: Secondary | ICD-10-CM | POA: Insufficient documentation

## 2021-04-26 DIAGNOSIS — R262 Difficulty in walking, not elsewhere classified: Secondary | ICD-10-CM | POA: Insufficient documentation

## 2021-04-26 DIAGNOSIS — M6281 Muscle weakness (generalized): Secondary | ICD-10-CM | POA: Diagnosis present

## 2021-04-26 DIAGNOSIS — I87002 Postthrombotic syndrome without complications of left lower extremity: Secondary | ICD-10-CM | POA: Diagnosis not present

## 2021-04-26 DIAGNOSIS — M79605 Pain in left leg: Secondary | ICD-10-CM | POA: Insufficient documentation

## 2021-04-26 DIAGNOSIS — I872 Venous insufficiency (chronic) (peripheral): Secondary | ICD-10-CM | POA: Insufficient documentation

## 2021-04-26 NOTE — Therapy (Addendum)
[facility] [address] [place], [place], [address] Phone: [phone]   Fax:  [phone]  Physical Therapy Evaluation  Patient Details  Name: Monica Holland MRN: [id number] Date of Birth: 01-Jan-1986 Referring Provider (PT): Leonie Douglas, MD   Encounter Date: 04/26/2021   PT End of Session - 04/26/21 1057     Visit Number 1    Number of Visits 5   4 weeks + eval   Date for PT Re-Evaluation 05/27/21    Authorization Type [employer] M[employer] (VL: 27)    Authorization Time Period Awaiting Authorization    PT Start Time 1019    PT Stop Time 1052    PT Time Calculation (min) 33 min    Activity Tolerance Patient limited by pain    Behavior During Therapy [facility] for tasks assessed/performed             Past Medical History:  Diagnosis Date   DVT (deep venous thrombosis) (HCC)     Past Surgical History:  Procedure Laterality Date   ANKLE SURGERY Right Jan 2014    There were no vitals filed for this visit.    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 Reports having significant pain and affecting ROM in the knee. Reports it has worsened in the past 6-7 months. No falls. Patient reports no restriction regarding the knee. Reports the swelling fluctuates throughout the day.    Patient is accompained by: Family member    Pertinent History ankle surgery, hx of DVT    Limitations Standing;Walking;Sitting    How long can you walk comfortably? 30 minutes    Patient Stated Goals Improve the pain and movement in the knee.    Currently in Pain? Yes    Pain Score 7     Pain Location Knee    Pain Orientation Left    Pain Descriptors / Indicators Pounding    Pain Type Chronic pain    Pain Onset More than a month ago    Pain Frequency Intermittent    Aggravating Factors   with movement, bending of the knee                [facility] PT Assessment - 04/26/21 0001       Assessment   Medical Diagnosis Chronic venous insufficiency/impaired mobility    Referring Provider (PT) Leonie Douglas, MD    Onset Date/Surgical Date 03/17/21    Next MD Visit no follow up visit scheduled    Prior Therapy None      Precautions   Precautions None      Restrictions   Weight Bearing Restrictions No      Balance Screen   Has the patient fallen in the past 6 months No    Has the patient had a decrease in activity level because of a fear of falling?  No    Is the patient reluctant to leave their home because of a fear of falling?  No      Home Environment   Living Environment Private residence    Available Help at Discharge Family    Type of Home Apartment    Home Access Stairs to enter    Entrance Stairs-Number of Steps 3    Entrance Stairs-Rails None    Home Layout One level    Additional Comments reports  pain in the knee with ascending the stairs      Prior Function   Level of Independence Independent    Vocation Full time employment    Research officer, trade union, plans to return next week      Cognition   Overall Cognitive Status Within Functional Limits for tasks assessed      Observation/Other Assessments-Edema    Edema --   swelling in the LLE > RLE     Sensation   Light Touch Impaired by gross assessment    Additional Comments reports has some tingling/numbness in the BLE      Coordination   Gross Motor Movements are Fluid and Coordinated Yes      ROM / Strength   AROM / PROM / Strength AROM;Strength      AROM   Overall AROM  Deficits;Due to pain    AROM Assessment Site Knee    Right/Left Knee Left    Left Knee Extension 0    Left Knee Flexion 116      Strength   Overall Strength Deficits    Strength Assessment Site Hip;Knee    Right/Left Hip Left    Left Hip Flexion 4+/5    Left Hip ABduction 4+/5    Right/Left  Knee Left    Left Knee Flexion 4+/5    Left Knee Extension 4+/5      Transfers   Transfers Sit to Stand;Stand to Sit    Sit to Stand 5: Supervision    Five time sit to stand comments  19.19 seconds, having to use UE support on about 3rd rep. 10/10 pain after completion in the L knee    Stand to Sit 5: Supervision      Ambulation/Gait   Ambulation/Gait Yes    Ambulation/Gait Assistance 5: Supervision    Ambulation/Gait Assistance Details completed ambulation x 115 ft, antalgic gait pattern noted, no AD    Ambulation Distance (Feet) 115 Feet    Assistive device None    Gait Pattern Step-through pattern;Decreased stance time - left;Antalgic    Ambulation Surface Level;Indoor    Gait velocity 13.50 secs = 2.42 ft/sec    Stairs Yes    Stairs Assistance 5: Supervision    Stairs Assistance Details (indicate cue type and reason) ascend/descend with reciprocal pattern, intermittent use of rails with descent due to pain, pain 9/10 after completion    Stair Management Technique One rail Right;Alternating pattern;Forwards    Number of Stairs 4    Height of Stairs 6    Gait Comments PT educating on proper technique with stairs to promote reduction in pain.                        Objective measurements completed on examination: See above findings.                PT Education - 04/26/21 1026     Education Details Educated on POC/Eval Findings;    Person(s) Educated Patient    Methods Explanation    Comprehension Verbalized understanding              PT Short Term Goals - 04/26/21 1058       PT SHORT TERM GOAL #1   Title STG = LTGs               PT Long Term Goals - 04/26/21 1058       PT LONG TERM GOAL #1   Title  Patient will be independent with initial HEP for ROM/Strengthening    Baseline no HEP established    Time 4    Period Weeks    Status New    Target Date 05/27/21      PT LONG TERM GOAL #2   Title Patient will be able to  ambulate >/= 600 ft outdoors/indoors and various surfaces with </= 6/10 pain for improved tolernace for mobility    Baseline 115 indoors    Time 4    Period Weeks    Status New      PT LONG TERM GOAL #3   Title Pt will improve gait speed to >/= 2.62 ft/sec to demo improved community mobility    Baseline 2.42 ft/sec    Time 4    Period Weeks    Status New      PT LONG TERM GOAL #4   Title Patient will improve 5x sit <> stand to </= 15 seconds with </= 6/10 pain reported after completion    Baseline 19.19 secs, 10/10 pain    Time 4    Period Weeks    Status New                    Plan - 04/26/21 1100     Clinical Impression Statement Patient is a 36 y.o. female referred to Neuro OPPT services for Impaired Mobility due to Chronic DVT/Chronic Venous Insufficiency. Patient's PMH includes the following: ankle surgery, hx of DVT. Patient has insufficiency of left lower extremity causing edema and post thrombotic syndrome (C3 disease). Patient presents with the following impairments on evaluation: Pain, Edema in LLE, impaired mobiltiy including abnormal gait and decreased tolernace for functional activity, mild strength impairment, and mild ROM impairment (no functional ROM limitaitons, more limited due to significant pain). Patient will benefit from skilled PT services to address impairments and improve tolerance for functional activities    Personal Factors and Comorbidities Comorbidity 2    Comorbidities ankle surgery, hx of DVT    Examination-Activity Limitations Stairs;Locomotion Level;Stand;Sit    Examination-Participation Restrictions Occupation;Driving;Community Activity    Stability/Clinical Decision Making Stable/Uncomplicated    Clinical Decision Making Low    Rehab Potential Good    PT Frequency 1x / week    PT Duration 4 weeks    PT Treatment/Interventions ADLs/Self Care Home Management;Aquatic Therapy;DME Instruction;Gait training;Stair training;Therapeutic  activities;Therapeutic exercise;Balance training;Neuromuscular re-education;Functional mobility training;Patient/family education;Manual techniques;Passive range of motion    PT Next Visit Plan Assess 6MWT for endurance and determine if goal needed. Initiate HEP focused on strenghtening of LLE. Significant pain limiting activities, determine if tolerate session well. May beneift more from pain management referral vs. PT.    Recommended Other Services Pain Management?    Consulted and Agree with Plan of Care Patient;Family member/caregiver             Patient will benefit from skilled therapeutic intervention in order to improve the following deficits and impairments:  Abnormal gait, Decreased activity tolerance, Decreased strength, Pain, Decreased range of motion, Impaired sensation, Difficulty walking  Visit Diagnosis: Difficulty in walking, not elsewhere classified  Muscle weakness (generalized)  Pain of left lower extremity   Problem List There are no problems to display for this patient.   [employer] Authorization   Choose one: Neuro Rehabilitative  Standardized Assessment or Functional Outcome Tool: See Pain Assessment, 5x sit to stand, and Gait Velocity   Score or Percent Disability: 5xsit <> stand: 19.19 secs, Gait Speed = 2.42 ft/sec (  limited community ambulator)   Body Parts Treated (Select each separately):  Knee. Overall deficits/functional limitations for body part selected: moderate   Tempie DonningKaitlyn B Katheryne Gorr, PT, DPT 04/26/2021, 11:09 AM  [facility] [address] [place], [place], [address] Phone: [phone]   Fax:  [phone]  Name: Monica Holland MRN: [id number] Date of Birth: 1985-12-24

## 2021-05-10 ENCOUNTER — Telehealth: Payer: Self-pay

## 2021-05-10 ENCOUNTER — Ambulatory Visit: Payer: Medicaid Other | Admitting: Physical Therapy

## 2021-05-10 ENCOUNTER — Ambulatory Visit: Payer: Medicaid Other

## 2021-05-10 NOTE — Telephone Encounter (Signed)
Patient Name: Monica Holland MRN: [id number] DOB:[birth year], 37 y.o., female Today's Date: 05/10/2021  Pt was called today as she missed her PT appointment today at 05/10/21 at 11am. Phone number (Home/mobile) is invalid and unable to leave VM.   Ileana Ladd, PT 05/10/2021, 11:33 AM

## 2021-05-17 ENCOUNTER — Ambulatory Visit: Payer: Medicaid Other

## 2021-05-17 ENCOUNTER — Other Ambulatory Visit: Payer: Self-pay

## 2021-05-17 DIAGNOSIS — M6281 Muscle weakness (generalized): Secondary | ICD-10-CM

## 2021-05-17 DIAGNOSIS — M79605 Pain in left leg: Secondary | ICD-10-CM

## 2021-05-17 DIAGNOSIS — R262 Difficulty in walking, not elsewhere classified: Secondary | ICD-10-CM

## 2021-05-17 DIAGNOSIS — I872 Venous insufficiency (chronic) (peripheral): Secondary | ICD-10-CM | POA: Diagnosis not present

## 2021-05-17 NOTE — Patient Instructions (Signed)
Access Code: [email or web address] URL: [email or web address] Date: 05/17/2021 Prepared by: Jethro Bastos  Exercises Side Stepping with Resistance at Thighs - 1 x daily - 5 x weekly - 1 sets - 4 reps Forward Monster Walk with Resistance - 1 x daily - 5 x weekly - 1 sets - 4 reps

## 2021-05-17 NOTE — Therapy (Signed)
[facility] [address] [place], [place], [address] Phone: [phone]   Fax:  [phone]  Physical Therapy Treatment  Patient Details  Name: Monica Holland MRN: [id number] Date of Birth: 1985/07/08 Referring Provider (PT): Leonie Douglas, MD   Encounter Date: 05/17/2021   PT End of Session - 05/17/21 1236     Visit Number 2    Number of Visits 5   4 weeks + eval   Date for PT Re-Evaluation 05/27/21    Authorization Type [employer] M[employer] (VL: 27)    Authorization Time Period Awaiting Authorization    PT Start Time 1232    PT Stop Time 1314    PT Time Calculation (min) 42 min    Activity Tolerance Patient limited by pain    Behavior During Therapy [facility] for tasks assessed/performed             Past Medical History:  Diagnosis Date   DVT (deep venous thrombosis) (HCC)     Past Surgical History:  Procedure Laterality Date   ANKLE SURGERY Right Jan 2014    There were no vitals filed for this visit.   Subjective Assessment - 05/17/21 1235     Subjective Patient reports no new changes/complaints. Continues to have significant pain with activities.    Patient is accompained by: Family member    Pertinent History ankle surgery, hx of DVT    Limitations Standing;Walking;Sitting    How long can you walk comfortably? 30 minutes    Patient Stated Goals Improve the pain and movement in the knee.    Currently in Pain? Yes    Pain Score 9     Pain Location Leg    Pain Orientation Left    Pain Descriptors / Indicators Aching    Pain Type Chronic pain    Pain Onset More than a month ago    Pain Frequency Intermittent                [facility] PT Assessment - 05/17/21 0001       6 Minute Walk- Baseline   6 Minute Walk- Baseline yes    BP (mmHg) 131/94    HR (bpm) 78    02 Sat (%RA) 100 %    Modified Borg Scale for Dyspnea 0- Nothing at all      6 Minute walk- Post Test   6 Minute Walk Post Test yes     BP (mmHg) 134/90    HR (bpm) 81    02 Sat (%RA) 98 %    Modified Borg Scale for Dyspnea 2- Mild shortness of breath      6 minute walk test results    Aerobic Endurance Distance Walked 573    Endurance additional comments Pain reported at 10/10. require one standing rest break, very antalgic slow gait speed noted                [facility] Adult PT Treatment/Exercise - 05/17/21 0001       Transfers   Transfers Sit to Stand;Stand to Sit    Sit to Stand 6: Modified independent (Device/Increase time)    Stand to Sit 6: Modified independent (Device/Increase time)      Ambulation/Gait   Ambulation/Gait Yes    Ambulation/Gait Assistance 5: Supervision    Ambulation/Gait Assistance Details with completion of (See assesment tab)    Assistive device None    Gait Pattern Step-through pattern;Decreased stance time - left;Antalgic  Ambulation Surface Level;Indoor      Exercises   Exercises Knee/Hip      Knee/Hip Exercises: Machines for Strengthening   Total Gym Leg Press Completed BLE on 80# x 10 reps, cues to avoid extending the knee. Then transitioned to completing with single LE (LLE only) completed 40# x 10 reps. Incrased challenge with Single LE. Educating on return to gym.             Established Initial HEP, completed all of the following exercises during session:  Access Code: [email or web address] URL: [email or web address] Date: 05/17/2021 Prepared by: Jethro Bastos   Exercises Side Stepping with Resistance at Thighs - 1 x daily - 5 x weekly - 1 sets - 4 reps Forward Monster Walk with Resistance - 1 x daily - 5 x weekly - 1 sets - 4 reps        PT Education - 05/17/21 1307     Education Details Initial HEP; results    Person(s) Educated Patient    Methods Explanation;Demonstration;Handout    Comprehension Returned demonstration;Verbalized understanding              PT Short Term Goals - 04/26/21 1058       PT SHORT TERM GOAL #1    Title STG = LTGs               PT Long Term Goals - 04/26/21 1058       PT LONG TERM GOAL #1   Title Patient will be independent with initial HEP for ROM/Strengthening    Baseline no HEP established    Time 4    Period Weeks    Status New    Target Date 05/27/21      PT LONG TERM GOAL #2   Title Patient will be able to ambulate >/= 600 ft outdoors/indoors and various surfaces with </= 6/10 pain for improved tolernace for mobility    Baseline 115 indoors    Time 4    Period Weeks    Status New      PT LONG TERM GOAL #3   Title Pt will improve gait speed to >/= 2.62 ft/sec to demo improved community mobility    Baseline 2.42 ft/sec    Time 4    Period Weeks    Status New      PT LONG TERM GOAL #4   Title Patient will improve 5x sit <> stand to </= 15 seconds with </= 6/10 pain reported after completion    Baseline 19.19 secs, 10/10 pain    Time 4    Period Weeks    Status New                   Plan - 05/17/21 1320     Clinical Impression Statement Completed test with patient able to ambulate 573 ft with one standing rest break. Vitals stable, but significant increase in pain with increased ambulation time limiting patient's activity tolernace. Rest of session focused on BLE strengthening and establishing initial HEP.    Personal Factors and Comorbidities Comorbidity 2    Comorbidities ankle surgery, hx of DVT    Examination-Activity Limitations Stairs;Locomotion Level;Stand;Sit    Examination-Participation Restrictions Occupation;Driving;Community Activity    Stability/Clinical Decision Making Stable/Uncomplicated    Rehab Potential Good    PT Frequency 1x / week    PT Duration 4 weeks    PT Treatment/Interventions ADLs/Self Care Home Management;Aquatic Therapy;DME Instruction;Gait training;Stair training;Therapeutic activities;Therapeutic exercise;Balance training;Neuromuscular re-education;Functional mobility training;Patient/family  education;Manual  techniques;Passive range of motion    PT Next Visit Plan strenghtening of LLE. Significant pain limiting activities, determine if tolerate session well. May beneift more from pain management referral vs. PT.    Consulted and Agree with Plan of Care Patient;Family member/caregiver             Patient will benefit from skilled therapeutic intervention in order to improve the following deficits and impairments:  Abnormal gait, Decreased activity tolerance, Decreased strength, Pain, Decreased range of motion, Impaired sensation, Difficulty walking  Visit Diagnosis: Difficulty in walking, not elsewhere classified  Muscle weakness (generalized)  Pain of left lower extremity     Problem List There are no problems to display for this patient.   Tempie DonningKaitlyn B Evlyn Amason, PT, DPT 05/17/2021, 1:23 PM  [facility] [address] [place], [place], [address] Phone: [phone]   Fax:  [phone]  Name: Monica Holland MRN: [id number] Date of Birth: [birth year]

## 2021-05-24 ENCOUNTER — Ambulatory Visit: Payer: Medicaid Other | Attending: Vascular Surgery

## 2021-05-24 ENCOUNTER — Ambulatory Visit: Payer: Medicaid Other

## 2021-05-25 ENCOUNTER — Ambulatory Visit: Payer: Medicaid Other

## 2021-05-31 ENCOUNTER — Ambulatory Visit: Payer: Medicaid Other

## 2021-06-20 DIAGNOSIS — I8393 Asymptomatic varicose veins of bilateral lower extremities: Secondary | ICD-10-CM

## 2021-07-04 ENCOUNTER — Other Ambulatory Visit: Payer: Self-pay | Admitting: Family

## 2021-07-04 DIAGNOSIS — I82512 Chronic embolism and thrombosis of left femoral vein: Secondary | ICD-10-CM

## 2021-07-04 DIAGNOSIS — D6859 Other primary thrombophilia: Secondary | ICD-10-CM

## 2021-07-05 ENCOUNTER — Other Ambulatory Visit: Payer: Self-pay

## 2021-07-05 ENCOUNTER — Encounter: Payer: Self-pay | Admitting: Family

## 2021-07-05 ENCOUNTER — Inpatient Hospital Stay: Payer: Medicaid Other | Attending: Physician Assistant

## 2021-07-05 ENCOUNTER — Inpatient Hospital Stay (HOSPITAL_BASED_OUTPATIENT_CLINIC_OR_DEPARTMENT_OTHER): Payer: Medicaid Other | Admitting: Family

## 2021-07-05 VITALS — BP 122/86 | HR 84 | Temp 98.2°F | Resp 18 | Wt 178.8 lb

## 2021-07-05 DIAGNOSIS — Z800 Family history of malignant neoplasm of digestive organs: Secondary | ICD-10-CM | POA: Diagnosis not present

## 2021-07-05 DIAGNOSIS — Z7901 Long term (current) use of anticoagulants: Secondary | ICD-10-CM | POA: Insufficient documentation

## 2021-07-05 DIAGNOSIS — D6859 Other primary thrombophilia: Secondary | ICD-10-CM

## 2021-07-05 DIAGNOSIS — Z87891 Personal history of nicotine dependence: Secondary | ICD-10-CM | POA: Diagnosis not present

## 2021-07-05 DIAGNOSIS — Z86711 Personal history of pulmonary embolism: Secondary | ICD-10-CM

## 2021-07-05 DIAGNOSIS — I82502 Chronic embolism and thrombosis of unspecified deep veins of left lower extremity: Secondary | ICD-10-CM | POA: Insufficient documentation

## 2021-07-05 DIAGNOSIS — I82512 Chronic embolism and thrombosis of left femoral vein: Secondary | ICD-10-CM

## 2021-07-05 LAB — CMP (CANCER CENTER ONLY)
ALT: 18 U/L (ref 0–44)
AST: 22 U/L (ref 15–41)
Albumin: 4.3 g/dL (ref 3.5–5.0)
Alkaline Phosphatase: 51 U/L (ref 38–126)
Anion gap: 6 (ref 5–15)
BUN: 9 mg/dL (ref 6–20)
CO2: 26 mmol/L (ref 22–32)
Calcium: 8.9 mg/dL (ref 8.9–10.3)
Chloride: 104 mmol/L (ref 98–111)
Creatinine: 0.88 mg/dL (ref 0.44–1.00)
GFR, Estimated: >60 mL/min (ref >60)
Glucose, Bld: 83 mg/dL (ref 70–99)
Potassium: 4.3 mmol/L (ref 3.5–5.1)
Sodium: 136 mmol/L (ref 135–145)
Total Bilirubin: 0.9 mg/dL (ref 0.3–1.2)
Total Protein: 7.7 g/dL (ref 6.5–8.1)

## 2021-07-05 LAB — CBC WITH DIFFERENTIAL (CANCER CENTER ONLY)
Abs Immature Granulocytes: 0.01 10*3/uL (ref 0.00–0.07)
Basophils Absolute: 0 10*3/uL (ref 0.0–0.1)
Basophils Relative: 0 %
Eosinophils Absolute: 0.1 10*3/uL (ref 0.0–0.5)
Eosinophils Relative: 1 %
HCT: 43.4 % (ref 36.0–46.0)
Hemoglobin: 14.3 g/dL (ref 12.0–15.0)
Immature Granulocytes: 0 %
Lymphocytes Relative: 37 %
Lymphs Abs: 1.8 10*3/uL (ref 0.7–4.0)
MCH: 28 pg (ref 26.0–34.0)
MCHC: 32.9 g/dL (ref 30.0–36.0)
MCV: 84.9 fL (ref 80.0–100.0)
Monocytes Absolute: 0.4 10*3/uL (ref 0.1–1.0)
Monocytes Relative: 9 %
Neutro Abs: 2.6 10*3/uL (ref 1.7–7.7)
Neutrophils Relative %: 53 %
Platelet Count: 209 10*3/uL (ref 150–400)
RBC: 5.11 MIL/uL (ref 3.87–5.11)
RDW: 14.7 % (ref 11.5–15.5)
WBC Count: 4.9 10*3/uL (ref 4.0–10.5)
nRBC: 0 % (ref 0.0–0.2)

## 2021-07-05 LAB — LACTATE DEHYDROGENASE: LDH: 147 U/L (ref 98–192)

## 2021-07-05 LAB — D-DIMER, QUANTITATIVE: D-Dimer, Quant: 0.37 ug/mL-FEU (ref 0.00–0.50)

## 2021-07-05 LAB — SAVE SMEAR(SSMR), FOR PROVIDER SLIDE REVIEW

## 2021-07-05 NOTE — Progress Notes (Signed)
Hematology/Oncology Consultation  ? ?Name: Monica Holland      MRN: [id number]    Location: Room/bed info not found  Date: 07/05/2021 Time:3:24 PM ? ? ?REFERRING PHYSICIAN: Morene Crocker, PA-C ? 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 ?HISTORY OF PRESENT ILLNESS: Ms. Klunk is a very pleasant 36 yo [place] American female with history of DVT of the left lower extremity originally diagnosed in 2004, 2018 and recurrent now chronic thrombus in 2021. ?Her 2004 and 2018 DVT's were felt to be associated with pregnancy.  ?She also had a small nonocclusive PE in the right lower lobe in 2014.  ?Hypercoag work up was negative.  ?She was smoking 2-3 packs of cigarettes a day several years ago when diagnosed with DVT's and PE. She is now down to smoking 1/2 ppd and has started using a nicotine patch to quit completely.  ?No hormone replacement therapy.  ?She is tolerating Eliquis nicely and has been on 2.5 mg PO BID for around 2 years.  ?She notes mild SOB with over exertion and will take a break to rest when needed.  ?She has also had issues with balance and dizziness resulting in several falls. Thankfully, she states that she has not been seriously injured.  ?She has numbness and tingling in her feet more so in the left foot. She states that she has seen vascular and is not a candidate for surgery or stent placement.  ?She is wearing a compression stocking for added support.  ?She states that she has a maternal aunt and paternal aunt that had history of PE's.  ?Her cycle lasts 1-2 days and flow is heavy with noted blood clots.  ?She also occasionally has bleeding from her gums with brushing her teeth.  ?No other blood loss noted. No abnormal excessive bruising, no petechiae.  ?No personal history of cancer. Her paternal grandmother had history of colon cancer.  ?No history of diabetes or thyroid disease.  ?No  sickle cell disease or trait.  ?No fever, chills, n/v, cough, rash, dizziness, SOB, chest pain, palpitations, abdominal pain or changes in bowel or bladder habits.  ?No swelling, tenderness, numbness or tingling in her extremities.  ?No falls or syncope.  ?Her appetite comes and goes. She is doing her best to stay well hydrated throughout the day. Her weight is stable at 178 lbs.  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 She is on her feet a lot and does a good bit of lifting.  ? ?ROS: All other 10 point review of systems is negative.  ? ?PAST MEDICAL HISTORY:   ?Past Medical History:  ?Diagnosis Date  ? DVT (deep venous thrombosis) (HCC)   ? ? ?ALLERGIES: ?No Known Allergies ?   ?MEDICATIONS:  ?Current Outpatient Medications on File Prior to Visit  ?Medication Sig Dispense Refill  ? apixaban (ELIQUIS) 5 MG TABS tablet Take 1 tablet (5 mg total) by mouth 2 (two) times daily. 60 tablet 4  ? nicotine (NICODERM CQ - DOSED IN MG/24 HOURS) 21 mg/24hr patch nicotine 21 mg/24 hr daily transdermal patch ? Apply 1 patch every day by transdermal route.    ? [DISCONTINUED] Rivaroxaban 15 & 20 MG TBPK Take as directed on package: Start with one 15mg  tablet by mouth twice a day with food. On Day 22, switch to one 20mg  tablet once a day with food. (Patient not  taking: Reported on 04/02/2020) 51 each 0  ? ?No current facility-administered medications on file prior to visit.  ? ?  ?PAST SURGICAL HISTORY ?Past Surgical History:  ?Procedure Laterality Date  ? ANKLE SURGERY Right Jan 2014  ? ? ?FAMILY HISTORY: ?Family History  ?Problem Relation Age of Onset  ? Deep vein thrombosis Mother   ? Hyperlipidemia Mother   ? Diabetes Mother   ? High Cholesterol Mother   ? ? ?SOCIAL HISTORY: ? reports that she has quit smoking. Her smoking use included cigarettes. She has never used smokeless tobacco. She reports current alcohol use. She reports that she does not currently use drugs after having used the following drugs:  Marijuana. ? ?PERFORMANCE STATUS: ?The patient's performance status is 1 - Symptomatic but completely ambulatory ? ?PHYSICAL EXAM: ?Most Recent Vital Signs: There were no vitals taken for this visit. ?BP 122/86 (BP Location: Left Arm, Patient Position: Sitting)   Pulse 84   Temp 98.2 ?F (36.8 ?C) (Oral)   Resp 18   Wt 178 lb 12.8 oz (81.1 kg)   SpO2 100%   BMI 24.94 kg/m?  ? ?General Appearance:    Alert, cooperative, no distress, appears stated age  ?Head:    Normocephalic, without obvious abnormality, atraumatic  ?Eyes:    PERRL, conjunctiva/corneas clear, EOM's intact, fundi  ?  benign, both eyes  ?   ?   ?Throat:   Lips, mucosa, and tongue normal; teeth and gums normal  ?Neck:   Supple, symmetrical, trachea midline, no adenopathy;  ?  thyroid:  no enlargement/tenderness/nodules; no carotid ?  bruit or JVD  ?Back:     Symmetric, no curvature, ROM normal, no CVA tenderness  ?Lungs:     Clear to auscultation bilaterally, respirations unlabored  ?Chest Wall:    No tenderness or deformity  ? Heart:    Regular rate and rhythm, S1 and S2 normal, no murmur, rub   or gallop  ?   ?Abdomen:     Soft, non-tender, bowel sounds active all four quadrants,  ?  no masses, no organomegaly  ?   ?   ?Extremities:   Extremities normal, atraumatic, no cyanosis or edema  ?Pulses:   2+ and symmetric all extremities  ?Skin:   Skin color, texture, turgor normal, no rashes or lesions  ?Lymph nodes:   Cervical, supraclavicular, and axillary nodes normal  ?Neurologic:   CNII-XII intact, normal strength, sensation and reflexes  ?  throughout  ? ? ?LABORATORY DATA:  ?Results for orders placed or performed in visit on 07/05/21 (from the past 48 hour(s))  ?CBC with Differential (Cancer Center Only)     Status: None  ? Collection Time: 07/05/21  2:57 PM  ?Result Value Ref Range  ? WBC Count 4.9 4.0 - 10.5 K/uL  ? RBC 5.11 3.87 - 5.11 MIL/uL  ? Hemoglobin 14.3 12.0 - 15.0 g/dL  ? HCT 43.4 36.0 - 46.0 %  ? MCV 84.9 80.0 - 100.0 fL  ? MCH  28.0 26.0 - 34.0 pg  ? MCHC 32.9 30.0 - 36.0 g/dL  ? RDW 14.7 11.5 - 15.5 %  ? Platelet Count 209 150 - 400 K/uL  ? nRBC 0.0 0.0 - 0.2 %  ? Neutrophils Relative % 53 %  ? Neutro Abs 2.6 1.7 - 7.7 K/uL  ? Lymphocytes Relative 37 %  ? Lymphs Abs 1.8 0.7 - 4.0 K/uL  ? Monocytes Relative 9 %  ? Monocytes Absolute 0.4 0.1 - 1.0 K/uL  ?  Eosinophils Relative 1 %  ? Eosinophils Absolute 0.1 0.0 - 0.5 K/uL  ? Basophils Relative 0 %  ? Basophils Absolute 0.0 0.0 - 0.1 K/uL  ? Immature Granulocytes 0 %  ? Abs Immature Granulocytes 0.01 0.00 - 0.07 K/uL  ?  Comment: Performed at [facility] [place], [address], [place], [place] [address]  ?Save Smear [facility])     Status: None  ? Collection Time: 07/05/21  2:57 PM  ?Result Value Ref Range  ? Smear Review SMEAR STAINED AND AVAILABLE FOR REVIEW   ?  Comment: Performed at [facility] at [facility] [place], [address], [place], [place] [address]  ?   ? ?RADIOGRAPHY: ?No results found.   ?  ?PATHOLOGY: None ? ?ASSESSMENT/PLAN: Ms. Beverely PaceCheek is a very pleasant 36 yo [place] American female with history of multiple recurrent thrombotic events now on life anticoagulation with Eliquis. She also has family history of thrombotic events on maternal and paternal sides.  ?Hyper coag work up was negative.  ?We will continue to follow along with her and plan to see her again in 6 months.  ?She is interested in having a hysterectomy and plans to discuss with her gynecologist. If a surgery is scheduled she will let us know so we can give directions for Eliquis before and after.  ? ?All questions were answered. The patient knows to call the clinic with any problems, questions or concerns. We can certainly see the patient much sooner if necessary. ? ?The patient was discussed with Dr. Myna HidalgoEnnever and he is in agreement with the aforementioned.  ? ?Eileen StanfordSarah Carter, NP ?  ? ? ? ? ? ?

## 2021-07-06 ENCOUNTER — Telehealth: Payer: Self-pay | Admitting: *Deleted

## 2021-07-06 NOTE — Telephone Encounter (Signed)
Per 07/06/21 los - called and lvm of upcoming appointments - requested call back to confirm ?

## 2021-07-07 ENCOUNTER — Ambulatory Visit (INDEPENDENT_AMBULATORY_CARE_PROVIDER_SITE_OTHER): Payer: Medicaid Other | Admitting: Cardiovascular Disease

## 2021-07-07 ENCOUNTER — Other Ambulatory Visit: Payer: Self-pay

## 2021-07-07 ENCOUNTER — Encounter: Payer: Self-pay | Admitting: Cardiovascular Disease

## 2021-07-07 VITALS — BP 134/84 | HR 79 | Ht 71.0 in | Wt 179.4 lb

## 2021-07-07 DIAGNOSIS — I82532 Chronic embolism and thrombosis of left popliteal vein: Secondary | ICD-10-CM

## 2021-07-07 DIAGNOSIS — D6859 Other primary thrombophilia: Secondary | ICD-10-CM

## 2021-07-07 DIAGNOSIS — I87009 Postthrombotic syndrome without complications of unspecified extremity: Secondary | ICD-10-CM | POA: Diagnosis not present

## 2021-07-07 DIAGNOSIS — R0602 Shortness of breath: Secondary | ICD-10-CM

## 2021-07-07 NOTE — Progress Notes (Signed)
?Cardiology Office Note:   ? ?Date:  07/07/2021  ? ?ID:  Monica Holland, DOB 1985/05/15, MRN [id number] ? ?PCP:  Ellene Routeoyle, Natalie, MD ?  ?[facility] Providers ?Cardiologist:  Thurmon FairMihai Charli Liberatore, MD    ? ?Referring MD: Ellene Routeoyle, Natalie, MD  ? ?Chief Complaint  ?Patient presents with  ? Chronic DVT  ?Monica Holland is a 36 y.o. female who is being seen today for the evaluation of chronic DVT and family history of heart disease at the request of Ellene Routeoyle, Natalie, MD. ? ? ?History of Present Illness:   ? ?Monica Holland is a 36 y.o. female with a hx of recurrent problems with a left lower extremity DVT starting at age 36, during pregnancy in 2004.  She had recurrent DVT in 2014 and had a small pulmonary embolism at that time, again in 2018.  She has been taking chronic anticoagulation with Eliquis, now on the prophylactic dose. ? 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  Work-up showed no evidence of May Thurner syndrome.  She is wearing compression hose, thigh-high, but still has problems with neuropathic symptoms heaviness and exercise intolerance in that leg.  Recently she has also noticed exertional dyspnea, but is not sure whether it is the heaviness of the leg that slowing her down.  She does not have chest tightness or pain. ? ?She is interested in undergoing hysterectomy or similar procedure due to menorrhagia while taking Eliquis, which will be a lifelong prescription. ? ?There is a strong family history of thrombotic events.  Her mother had a DVT when she underwent total knee replacement around the age of 38.  Her mother also has diabetes and hypertension.  Her older sister died of pulmonary embolism at age 38.  Another sister who  had Down's sd.died at age 38 with congestive heart failure and also had some type of clot "that could not be surgically removed".  She has maternal aunts that have had pulmonary embolism.  Interestingly she also has a paternal aunt that had pulmonary embolism. ? ?Extensive work-up for hypercoagulable states was performed in 2009 including negative work-up for anticardiolipin antibodies, beta-1 glycoprotein, protein C, protein S, Antithrombin deficiencies, prothrombin gene and factor V Leiden mutations.  Negative ANA.  She was evaluated again by Dr. Clelia CroftShadad in the hematology clinic and he did not find an identifiable cause for hypercoagulable state. ? ?She is trying to quit smoking, and has smoked as much as 2 or 3 packs of cigarettes daily until a few years ago.  She has never been on estrogen oral contraceptives. ? ?Past Medical History:  ?Diagnosis Date  ? DVT (deep venous thrombosis) (HCC)   ? ? ?Past Surgical History:  ?Procedure Laterality Date  ? ANKLE SURGERY Right Jan 2014  ? ? ?Current Medications: ?Current Meds  ?Medication Sig  ? apixaban (ELIQUIS) 2.5 MG TABS tablet Eliquis 2.5 mg tablet ? Take 1 tablet twice a day by oral route for 30 days.  ?  ? ?Allergies:   Patient has no known allergies.  ? ?Social History  ? ?Socioeconomic History  ? Marital status: Single  ?  Spouse name: Not on file  ? Number of children: Not on file  ? Years of education: Not on file  ?  Highest education level: Not on file  ?Occupational History  ? Not on file  ?Tobacco Use  ? Smoking status: Every Day  ?  Packs/day: 0.50  ?  Types: Cigarettes  ? Smokeless tobacco: Never  ?Vaping Use  ? Vaping Use: Never used  ?Substance and Sexual Activity  ? Alcohol use: Yes  ?  Comment: weekends (a lot)  ? Drug use: Not Currently  ?  Types: Marijuana  ? Sexual activity: Yes  ?  Birth control/protection: None  ?Other Topics Concern  ? Not on file  ?Social History Narrative  ? Not on file  ? ?Social Determinants of Health  ? ?Financial Resource  Strain: Not on file  ?Food Insecurity: Not on file  ?Transportation Needs: Not on file  ?Physical Activity: Not on file  ?Stress: Not on file  ?Social Connections: Not on file  ?  ? ?Family History: ?The patient's family history includes Deep vein thrombosis in her mother; Diabetes in her mother; High Cholesterol in her mother; Hyperlipidemia in her mother. ? ?ROS:   ?Please see the history of present illness.    ? All other systems reviewed and are negative. ? ?EKGs/Labs/Other Studies Reviewed:   ? ?The following studies were reviewed today: ?Notes from Dr. Clelia Croft and from Dr. Lenell Antu ?Multiple lower extremity venous Doppler studies, most recently November 2022: ?LEFT:  ?- Findings consistent with chronic deep vein thrombosis involving the left common femoral vein, left femoral vein, and left popliteal vein.  ?Chest x-ray 06/25/2020 without evidence of cardiomegaly or heart failure ? ?CT angiogram of the chest November 2014 ?Tiny nonocclusive right lower lobe emboli (considering thready  ?configuration, this could in fact reflect recanalized more chronic  ?embolus though, no priors are available), without heart strain.  ? ?EKG:  EKG is ordered today.  The ekg ordered today demonstrates sinus rhythm, questionable left atrial abnormality and questionable LVH (voltage only, normal repolarization pattern) ? ?Recent Labs: ?07/05/2021: ALT 18; BUN 9; Creatinine 0.88; Hemoglobin 14.3; Platelet Count 209; Potassium 4.3; Sodium 136  ?Recent Lipid Panel ?   ?Component Value Date/Time  ? CHOL  07/23/2007 0620  ?  95        ?ATP III CLASSIFICATION: ? <200     mg/dL   Desirable ? 518-841  mg/dL   Borderline High ? >=660    mg/dL   High  ? TRIG 49 07/23/2007 0620  ? HDL 32 (L) 07/23/2007 6301  ? CHOLHDL 3.0 07/23/2007 0620  ? VLDL 10 07/23/2007 0620  ? LDLCALC  07/23/2007 0620  ?  53        ?Total Cholesterol/HDL:CHD Risk ?Coronary Heart Disease Risk Table ?                    Men   Women ? 1/2 Average Risk   3.4   3.3   ? ? ? ?Risk Assessment/Calculations:   ? ?    ? ?Physical Exam:   ? ?VS:  BP 134/84   Pulse 79   Ht 5\' 11"  (1.803 m)   Wt 179 lb 6.4 oz (81.4 kg)   SpO2 99%   BMI 25.02 kg/m?    ? ?Wt Readings from Last 3 Encounters:  ?07/07/21 179 lb 6.4 oz (81.4 kg)  ?07/05/21 178 lb 12.8 oz (81.1 kg)  ?04/08/21 178 lb (80.7 kg)  ?  ? ?GEN: Tall and lean, even athletic, although borderline overweight by BMI, well nourished, well developed in no acute distress ?HEENT: Normal ?NECK:  No JVD; No carotid bruits ?LYMPHATICS: No lymphadenopathy ?CARDIAC: RRR, no murmurs, rubs, gallops ?RESPIRATORY:  Clear to auscultation without rales, wheezing or rhonchi  ?ABDOMEN: Soft, non-tender, non-distended ?MUSCULOSKELETAL:  No deformity wearing tight compression stocking of the left lower extremity, with residual edema.  Collateral venous circulation around the pubic area and upper thigh reported. ?SKIN: Warm and dry ?NEUROLOGIC:  Alert and oriented x 3 ?PSYCHIATRIC:  Normal affect  ? ?ASSESSMENT:   ? ?1. Shortness of breath   ?2. Chronic deep vein thrombosis (DVT) of popliteal vein of left lower extremity (HCC)   ?3. Postphlebitic syndrome   ?4. Hypercoagulable state (HCC)   ? ?PLAN:   ? ?In order of problems listed above: ? ?Exertional dyspnea: She is worried that she may have heart disease, like her older sister.  We discussed the fact that in Down syndrome there are often congenital cardiac abnormalities that predispose to heart failure.  It also sounds to me that the postphlebitic syndrome is what slowing her down more than any shortness of breath.  Nevertheless, recommend an echocardiogram to make sure she does not have any structural heart abnormalities.  The echo will also allow [place] to find any signs of pulmonary hypertension as a sequelae of her pulmonary embolism (although the abnormalities on CT angiography are so minor that it is quite unlikely). 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  Continue conservative management with external compression. ?Probable hypercoagulable state:

## 2021-07-07 NOTE — Patient Instructions (Signed)
Medication Instructions:  ?No changes ?*If you need a refill on your cardiac medications before your next appointment, please call your pharmacy* ? ? ?Lab Work: ?None ordered ?If you have labs (blood work) drawn today and your tests are completely normal, you will receive your results only by: ?MyChart Message (if you have MyChart) OR ?A paper copy in the mail ?If you have any lab test that is abnormal or we need to change your treatment, we will call you to review the results. ? ? ?Testing/Procedures: ?Your physician has requested that you have an echocardiogram. Echocardiography is a painless test that uses sound waves to create images of your heart. It provides your doctor with information about the size and shape of your heart and how well your heart?s chambers and valves are working. You may receive an ultrasound enhancing agent through an IV if needed to better visualize your heart during the echo.This procedure takes approximately one hour. There are no restrictions for this procedure. This will take place at the [address], [address].  ? ? ? ?Follow-Up: ?At [facility], you and your health needs are our priority.  As part of our continuing mission to provide you with exceptional heart care, we have created designated Provider Care Teams.  These Care Teams include your primary Cardiologist (physician) and Advanced Practice Providers (APPs -  Physician Assistants and Nurse Practitioners) who all work together to provide you with the care you need, when you need it. ? ?We recommend signing up for the patient portal called "MyChart".  Sign up information is provided on this After Visit Summary.  MyChart is used to connect with patients for Virtual Visits (Telemedicine).  Patients are able to view lab/test results, encounter notes, upcoming appointments, etc.  Non-urgent messages can be sent to your provider as well.   ?To learn more about what you can do with MyChart, go to [email or web address].    ? ?Your next appointment:   ?12 month(s) ? ?The format for your next appointment:   ?In Person ? ?Provider:   ?Mihai [name], MD ? ?

## 2021-07-08 ENCOUNTER — Emergency Department (HOSPITAL_COMMUNITY)
Admission: EM | Admit: 2021-07-08 | Discharge: 2021-07-08 | Disposition: A | Discharge status: Home / Self Care | Payer: Medicaid Other | Attending: Emergency Medicine | Admitting: Emergency Medicine

## 2021-07-08 ENCOUNTER — Encounter (HOSPITAL_COMMUNITY): Payer: Self-pay | Admitting: Emergency Medicine

## 2021-07-08 ENCOUNTER — Emergency Department (HOSPITAL_COMMUNITY): Payer: Medicaid Other

## 2021-07-08 DIAGNOSIS — Z7901 Long term (current) use of anticoagulants: Secondary | ICD-10-CM | POA: Insufficient documentation

## 2021-07-08 DIAGNOSIS — K2900 Acute gastritis without bleeding: Secondary | ICD-10-CM | POA: Insufficient documentation

## 2021-07-08 DIAGNOSIS — R1010 Upper abdominal pain, unspecified: Secondary | ICD-10-CM | POA: Diagnosis present

## 2021-07-08 LAB — PROTIME-INR
INR: 1.1 (ref 0.8–1.2)
Prothrombin Time: 13.7 seconds (ref 11.4–15.2)

## 2021-07-08 LAB — COMPREHENSIVE METABOLIC PANEL
ALT: 16 U/L (ref 0–44)
AST: 17 U/L (ref 15–41)
Albumin: 3.8 g/dL (ref 3.5–5.0)
Alkaline Phosphatase: 51 U/L (ref 38–126)
Anion gap: 5 (ref 5–15)
BUN: 10 mg/dL (ref 6–20)
CO2: 23 mmol/L (ref 22–32)
Calcium: 8.2 mg/dL — ABNORMAL LOW (ref 8.9–10.3)
Chloride: 105 mmol/L (ref 98–111)
Creatinine, Ser: 0.77 mg/dL (ref 0.44–1.00)
GFR, Estimated: >60 mL/min (ref >60)
Glucose, Bld: 106 mg/dL — ABNORMAL HIGH (ref 70–99)
Potassium: 3.8 mmol/L (ref 3.5–5.1)
Sodium: 133 mmol/L — ABNORMAL LOW (ref 135–145)
Total Bilirubin: 0.9 mg/dL (ref 0.3–1.2)
Total Protein: 7 g/dL (ref 6.5–8.1)

## 2021-07-08 LAB — CBC WITH DIFFERENTIAL/PLATELET
Abs Immature Granulocytes: 0.01 10*3/uL (ref 0.00–0.07)
Basophils Absolute: 0 10*3/uL (ref 0.0–0.1)
Basophils Relative: 0 %
Eosinophils Absolute: 0.1 10*3/uL (ref 0.0–0.5)
Eosinophils Relative: 1 %
HCT: 43.9 % (ref 36.0–46.0)
Hemoglobin: 14.5 g/dL (ref 12.0–15.0)
Immature Granulocytes: 0 %
Lymphocytes Relative: 14 %
Lymphs Abs: 0.7 10*3/uL (ref 0.7–4.0)
MCH: 28.2 pg (ref 26.0–34.0)
MCHC: 33 g/dL (ref 30.0–36.0)
MCV: 85.4 fL (ref 80.0–100.0)
Monocytes Absolute: 0.3 10*3/uL (ref 0.1–1.0)
Monocytes Relative: 7 %
Neutro Abs: 3.7 10*3/uL (ref 1.7–7.7)
Neutrophils Relative %: 78 %
Platelets: 194 10*3/uL (ref 150–400)
RBC: 5.14 MIL/uL — ABNORMAL HIGH (ref 3.87–5.11)
RDW: 14.6 % (ref 11.5–15.5)
WBC: 4.8 10*3/uL (ref 4.0–10.5)
nRBC: 0 % (ref 0.0–0.2)

## 2021-07-08 LAB — URINALYSIS, ROUTINE W REFLEX MICROSCOPIC
Bacteria, UA: NONE SEEN
Bilirubin Urine: NEGATIVE
Glucose, UA: NEGATIVE mg/dL
Hgb urine dipstick: NEGATIVE
Ketones, ur: NEGATIVE mg/dL
Nitrite: NEGATIVE
Protein, ur: NEGATIVE mg/dL
Specific Gravity, Urine: 1.018 (ref 1.005–1.030)
pH: 7 (ref 5.0–8.0)

## 2021-07-08 LAB — PREGNANCY, URINE: Preg Test, Ur: NEGATIVE

## 2021-07-08 LAB — LIPASE, BLOOD: Lipase: 39 U/L (ref 11–51)

## 2021-07-08 MED ORDER — IOHEXOL 300 MG/ML  SOLN
100.0000 mL | Freq: Once | INTRAMUSCULAR | Status: AC | PRN
  Administered 2021-07-08: 100 mL via INTRAVENOUS

## 2021-07-08 MED ORDER — ALUM & MAG HYDROXIDE-SIMETH 200-200-20 MG/5ML PO SUSP
30.0000 mL | Freq: Once | ORAL | Status: AC
  Administered 2021-07-08: 30 mL via ORAL
  Filled 2021-07-08: qty 30

## 2021-07-08 MED ORDER — SODIUM CHLORIDE (PF) 0.9 % IJ SOLN
INTRAMUSCULAR | Status: AC
  Filled 2021-07-08: qty 50

## 2021-07-08 MED ORDER — ONDANSETRON 4 MG PO TBDP: 4.0000 mg | ORAL_TABLET | Freq: Three times a day (TID) | ORAL | 0 refills | Status: DC | PRN

## 2021-07-08 MED ORDER — HYDROMORPHONE HCL 1 MG/ML IJ SOLN
0.5000 mg | Freq: Once | INTRAMUSCULAR | Status: AC
  Administered 2021-07-08: 0.5 mg via INTRAVENOUS
  Filled 2021-07-08: qty 1

## 2021-07-08 MED ORDER — ONDANSETRON HCL 4 MG/2ML IJ SOLN
4.0000 mg | Freq: Once | INTRAMUSCULAR | Status: AC
Start: 2021-07-08 — End: 2021-07-08
  Administered 2021-07-08: 4 mg via INTRAVENOUS
  Filled 2021-07-08: qty 2

## 2021-07-08 MED ORDER — HYDROCODONE-ACETAMINOPHEN 5-325 MG PO TABS: 1.0000 | ORAL_TABLET | ORAL | 0 refills | Status: DC | PRN

## 2021-07-08 MED ORDER — HYDROCODONE-ACETAMINOPHEN 5-325 MG PO TABS
1.0000 | ORAL_TABLET | Freq: Once | ORAL | Status: AC
  Administered 2021-07-08: 1 via ORAL
  Filled 2021-07-08: qty 1

## 2021-07-08 MED ORDER — LANSOPRAZOLE 30 MG PO CPDR: 30.0000 mg | DELAYED_RELEASE_CAPSULE | Freq: Every day | ORAL | 1 refills | Status: DC

## 2021-07-08 MED ORDER — LIDOCAINE VISCOUS HCL 2 % MT SOLN
15.0000 mL | Freq: Once | OROMUCOSAL | Status: AC
  Administered 2021-07-08: 15 mL via ORAL
  Filled 2021-07-08: qty 15

## 2021-07-08 NOTE — ED Notes (Signed)
Patient requesting additional pain medication before discharge, states her pain has returned. PA aware.  ?

## 2021-07-08 NOTE — ED Provider Notes (Signed)
?[facility]-EMERGENCY DEPT ?Provider Note ? ? ?CSN: [id number] ?Arrival date & time: 07/08/21  [id number] ? ?  ? ?History ? ?Chief Complaint  ?Patient presents with  ? Abdominal Pain  ? ? ?Monica Holland is a 36 y.o. female. ? ?Pt complains of pain in her upper abdomen.  Pt has a history of DVT's.  Pt reports she did not take her eliquis yesterday evening or this morning.   ? ?The history is provided by the patient. No language interpreter was used.  ?Abdominal Pain ?Pain location:  Generalized ?Pain quality: stabbing   ?Pain radiates to:  Does not radiate ?Pain severity:  Moderate ?Onset quality:  Gradual ?Duration:  1 day ?Timing:  Constant ?Progression:  Worsening ?Chronicity:  New ?Relieved by:  Nothing ?Worsened by:  Nothing ?Ineffective treatments:  None tried ?Associated symptoms: no chest pain, no fever and no shortness of breath   ?Risk factors: not pregnant   ? ?  ? ?Home Medications ?Prior to Admission medications   ?Medication Sig Start Date End Date Taking? Authorizing Provider  ?apixaban (ELIQUIS) 2.5 MG TABS tablet Eliquis 2.5 mg tablet ? Take 1 tablet twice a day by oral route for 30 days. 06/27/21   [provider]  ?Rivaroxaban 15 & 20 MG TBPK Take as directed on package: Start with one 15mg  tablet by mouth twice a day with food. On Day 22, switch to one 20mg  tablet once a day with food. ?Patient not taking: Reported on 04/02/2020 03/05/13 06/25/20  Derwood KaplanNanavati, Ankit, MD  ?   ? ?Allergies    ?Patient has no known allergies.   ? ?Review of Systems   ?Review of Systems  ?Constitutional:  Negative for fever.  ?Respiratory:  Negative for shortness of breath.   ?Cardiovascular:  Negative for chest pain.  ?Gastrointestinal:  Positive for abdominal pain.  ?All other systems reviewed and are negative. ? ?Physical Exam ?Updated Vital Signs ?BP 138/76   Pulse 76   Temp 99 ?F (37.2 ?C) (Oral)   Resp 18   LMP 06/15/2021 (Approximate)   SpO2 100%  ?Physical Exam ?Vitals and nursing note  reviewed.  ?Constitutional:   ?   Appearance: She is well-developed.  ?HENT:  ?   Head: Normocephalic.  ?Cardiovascular:  ?   Rate and Rhythm: Normal rate and regular rhythm.  ?Pulmonary:  ?   Effort: Pulmonary effort is normal.  ?Abdominal:  ?   General: Abdomen is flat. Bowel sounds are normal. There is no distension.  ?   Palpations: Abdomen is soft.  ?   Tenderness: There is abdominal tenderness in the epigastric area.  ?Musculoskeletal:     ?   General: Normal range of motion.  ?   Cervical back: Normal range of motion.  ?Skin: ?   General: Skin is warm.  ?Neurological:  ?   General: No focal deficit present.  ?   Mental Status: She is alert and oriented to person, place, and time.  ?Psychiatric:     ?   Mood and Affect: Mood normal.  ? ? ?ED Results / Procedures / Treatments   ?Labs ?(all labs ordered are listed, but only abnormal results are displayed) ?Labs Reviewed  ?CBC WITH DIFFERENTIAL/PLATELET - Abnormal; Notable for the following components:  ?    Result Value  ? RBC 5.14 (*)   ? All other components within normal limits  ?COMPREHENSIVE METABOLIC PANEL - Abnormal; Notable for the following components:  ? Sodium 133 (*)   ?  Glucose, Bld 106 (*)   ? Calcium 8.2 (*)   ? All other components within normal limits  ?URINALYSIS, ROUTINE W REFLEX MICROSCOPIC - Abnormal; Notable for the following components:  ? Leukocytes,Ua SMALL (*)   ? All other components within normal limits  ?LIPASE, BLOOD  ?PREGNANCY, URINE  ?PROTIME-INR  ? ? ?EKG ?None ? ?Radiology ?CT ABDOMEN PELVIS W CONTRAST ? ?Result Date: 07/08/2021 ?CLINICAL DATA:  Acute abdominal pain beginning last night. EXAM: CT ABDOMEN AND PELVIS WITH CONTRAST TECHNIQUE: Multidetector CT imaging of the abdomen and pelvis was performed using the standard protocol following bolus administration of intravenous contrast. RADIATION DOSE REDUCTION: This exam was performed according to the departmental dose-optimization program which includes automated exposure  control, adjustment of the mA and/or kV according to patient size and/or use of iterative reconstruction technique. CONTRAST:  OMNIPAQUE IOHEXOL 300 MG/ML  SOLN COMPARISON:  None. FINDINGS: Lower Chest: No acute findings. Hepatobiliary: No hepatic masses identified. Gallbladder is unremarkable. No evidence of biliary ductal dilatation. Pancreas:  No mass or inflammatory changes. Spleen: Within normal limits in size and appearance. Adrenals/Urinary Tract: No masses identified. No evidence of ureteral calculi or hydronephrosis. Stomach/Bowel: No evidence of obstruction, inflammatory process or abnormal fluid collections. Normal appendix visualized. Vascular/Lymphatic: No pathologically enlarged lymph nodes. No acute vascular findings. Dilated veins in the suprapubic subcutaneous soft tissues remain stable. Reproductive: No mass or inflammatory process identified. Tiny amount of free fluid is again seen in the pelvic cul-de-sac, most likely physiologic in a reproductive age female. Other:  None. Musculoskeletal:  No suspicious bone lesions identified. IMPRESSION: No acute findings or other significant abnormality within the abdomen or pelvis. Electronically Signed   By: Danae Orleans M.D.   On: 07/08/2021 12:19   ? ?Procedures ?Procedures  ? ? ?Medications Ordered in ED ?Medications  ?sodium chloride (PF) 0.9 % injection (has no administration in time range)  ?alum & mag hydroxide-simeth (MAALOX/MYLANTA) 200-200-20 MG/5ML suspension 30 mL (30 mLs Oral Given 07/08/21 1000)  ?  And  ?lidocaine (XYLOCAINE) 2 % viscous mouth solution 15 mL (15 mLs Oral Given 07/08/21 1000)  ?HYDROmorphone (DILAUDID) injection 0.5 mg (0.5 mg Intravenous Given 07/08/21 1102)  ?ondansetron (ZOFRAN) injection 4 mg (4 mg Intravenous Given 07/08/21 1104)  ?iohexol (OMNIPAQUE) 300 MG/ML solution 100 mL (100 mLs Intravenous Contrast Given 07/08/21 1154)  ? ? ?ED Course/ Medical Decision Making/ A&P ?  ?                        ?Medical Decision  Making ?Pt complains of pain in the epigastric area ? ?Problems Addressed: ?Acute gastritis without hemorrhage, unspecified gastritis type: acute illness or injury ? ?Amount and/or Complexity of Data Reviewed ?External Data Reviewed: notes. ?   Details: Pt has a history of dvts and is on eliquis ?Labs: ordered. Decision-making details documented in ED Course. ?   Details: labs ordered, reviewed and interpreted.  Pt has normal labs. ?Radiology: ordered and independent interpretation performed. Decision-making details documented in ED Course. ?   Details: Ct scan  no acute abnormality ?ECG/medicine tests: ordered and independent interpretation performed. Decision-making details documented in ED Course. ?   Details: no acute abnormality ? ?Risk ?OTC drugs. ?Prescription drug management. ?Parenteral controlled substances. ?Risk Details: Pt reports no relief with gi cocktail.  Pt given dilauid IV.  Pt reports improved pain.  Pt eating a burrito.  Pt advised to stick to clear liquids.Pt given rx for prevacid.zofran and hydrocodone  ? ? ? ? ? ? ? ? ? ? ?  Final Clinical Impression(s) / ED Diagnoses ?Final diagnoses:  ?Acute gastritis without hemorrhage, unspecified gastritis type  ? ? ?Rx / DC Orders ?ED Discharge Orders   ? ? None  ? ?  ? ?An After Visit Summary was printed and given to the patient.  ?  ?Elson Areas, PA-C ?07/08/21 1309 ? ?  ?Bethann Berkshire, MD ?07/11/21 [phone] ? ?

## 2021-07-08 NOTE — ED Triage Notes (Signed)
Reports upper mid-epigastric pain x12 hours, constant and aching. Denies N/V/D. Reports compliance w/ eliquis, 2.5 mg.  ?

## 2021-07-08 NOTE — ED Notes (Signed)
Gave pt a gingerale  

## 2021-07-08 NOTE — Discharge Instructions (Addendum)
Return if any problems.

## 2021-07-15 ENCOUNTER — Other Ambulatory Visit (HOSPITAL_COMMUNITY): Payer: Medicaid Other

## 2021-07-26 ENCOUNTER — Encounter (HOSPITAL_COMMUNITY): Payer: Self-pay

## 2021-07-26 ENCOUNTER — Ambulatory Visit (HOSPITAL_COMMUNITY): Payer: Medicaid Other | Attending: Cardiology

## 2021-07-26 NOTE — Progress Notes (Signed)
Verified appointment "no show" status with Lendon Colonel at 16:02.  ?

## 2021-07-27 ENCOUNTER — Encounter (HOSPITAL_COMMUNITY): Payer: Self-pay | Admitting: Cardiovascular Disease

## 2021-08-31 ENCOUNTER — Ambulatory Visit (INDEPENDENT_AMBULATORY_CARE_PROVIDER_SITE_OTHER): Payer: Medicaid Other

## 2021-08-31 ENCOUNTER — Encounter: Payer: Self-pay | Admitting: Family Medicine

## 2021-08-31 ENCOUNTER — Ambulatory Visit (INDEPENDENT_AMBULATORY_CARE_PROVIDER_SITE_OTHER): Payer: Medicaid Other | Admitting: Family Medicine

## 2021-08-31 VITALS — BP 119/87 | HR 81 | Wt 176.8 lb

## 2021-08-31 DIAGNOSIS — N939 Abnormal uterine and vaginal bleeding, unspecified: Secondary | ICD-10-CM | POA: Diagnosis not present

## 2021-08-31 DIAGNOSIS — R0602 Shortness of breath: Secondary | ICD-10-CM

## 2021-08-31 DIAGNOSIS — I82402 Acute embolism and thrombosis of unspecified deep veins of left lower extremity: Secondary | ICD-10-CM | POA: Diagnosis not present

## 2021-08-31 LAB — ECHOCARDIOGRAM COMPLETE
AR max vel: 3.11 cm2
AV Area VTI: 3.03 cm2
AV Area mean vel: 2.78 cm2
AV Mean grad: 3 mmHg
AV Peak grad: 6.4 mmHg
Ao pk vel: 1.26 m/s
Area-P 1/2: 3.68 cm2
Calc EF: 55.4 %
S' Lateral: 2.71 cm
Single Plane A2C EF: 53.1 %
Single Plane A4C EF: 61.2 %

## 2021-08-31 NOTE — Assessment & Plan Note (Signed)
I reviewed patients chart in depth as well as her CT scan from March of this year. On my read of the CT she has an essentially normal sized uterus with possibly a few small fibroids.  ? ?Discussed with patient that I am not sure given her other medical issues that hysterectomy is the best option. Would be complex to get her ready for surgery and risk of complications would be high.  ? ?In light of this I reviewed her other options. Probably the best one would be to trial a hormonal IUD as this is the least invasive and least risky procedure, though did emphasize it would take a few months to be fully effective. Discussed uterine artery embolization, although given minimal to no fibroid burden not sure this would be a great option for her. Discussed endometrial ablation which would also be a good choice. Finally discussed transvagina/laparoscopic/open hysterectomy but repeatedly emphasized this is likely not a good or even realistic option for her.  ? ?Finally discussed that while she is awaiting an appointment with a Gyn surgeon we should obtain a TVUS to evaluate pelvic anatomy more clearly. Also advised patient she needs a pap smear and EMB at next visit, she is OK with this.  ? ?Given handouts on all of the above, return in 4-6 weeks to discuss surgical options.  ?

## 2021-08-31 NOTE — Progress Notes (Signed)
? ?GYNECOLOGY OFFICE VISIT NOTE ? ?History:  ? Monica Holland is a 36 y.o. G4P4000 here today for consultation for possible hysterectomy. ? ?Patient reports 20 years of irregular, heavy, prolonged bleeding ?No family hx of colon or uterine cancer that she is aware of ?DM, HTN does run in her family ?She strongly desires hysterectomy to control her bleeding ?Has history of recurrent DVT's ?Is on lifelong anticoagulation ?Quit smoking two weeks ago ? ?Health Maintenance Due  ?Topic Date Due  ? COVID-19 Vaccine (1) Never done  ? Hepatitis C Screening  Never done  ? TETANUS/TDAP  Never done  ? PAP SMEAR-Modifier  Never done  ? ? ?Past Medical History:  ?Diagnosis Date  ? DVT (deep venous thrombosis) (HCC)   ? ? ?Past Surgical History:  ?Procedure Laterality Date  ? ANKLE SURGERY Right Jan 2014  ? ? ?The following portions of the patient's history were reviewed and updated as appropriate: allergies, current medications, past family history, past medical history, past social history, past surgical history and problem list.  ? ?Health Maintenance:   ?Last pap: ?No results found for: DIAGPAP, HPV, HPVHIGH ?*Needs* ? ?Last mammogram:  ?N/a  ? ? ?Review of Systems:  ?Pertinent items noted in HPI and remainder of comprehensive ROS otherwise negative. ? ?Physical Exam:  ?BP 119/87   Pulse 81   Wt 176 lb 12.8 oz (80.2 kg)   LMP 08/15/2021 (Approximate)   BMI 24.66 kg/m?  ?CONSTITUTIONAL: Well-developed, well-nourished female in no acute distress.  ?HEENT:  Normocephalic, atraumatic. External right and left ear normal. No scleral icterus.  ?NECK: Normal range of motion, supple, no masses noted on observation ?SKIN: No rash noted. Not diaphoretic. No erythema. No pallor. ?MUSCULOSKELETAL: Normal range of motion. No edema noted. ?NEUROLOGIC: Alert and oriented to person, place, and time. Normal muscle tone coordination.  ?PSYCHIATRIC: Normal mood and affect. Normal behavior. Normal judgment and thought content. ?RESPIRATORY:  Effort normal, no problems with respiration noted ? ? ?Labs and Imaging ?No results found for this or any previous visit (from the past 168 hour(s)). ?No results found.    ?Assessment and Plan:  ? ?Problem List Items Addressed This Visit   ? ?  ? Cardiovascular and Mediastinum  ? 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  ? Genitourinary  ? Abnormal uterine bleeding (AUB) - Primary  ?  I reviewed patients chart in depth as well as her CT scan from March of this year. On my read of the CT she has an essentially normal sized uterus with possibly a few small fibroids.  ? ?Discussed with patient that I am not sure given her other medical issues that hysterectomy is the best option. Would be complex to get her ready for surgery and risk of complications would be high.  ? ?In light of this I reviewed her other options. Probably the best one would be to trial a hormonal IUD as this is the least invasive and least risky procedure, though did emphasize it would take a few months to be fully effective. Discussed uterine artery embolization, although given minimal to no fibroid burden not sure this would be a great option for her. Discussed endometrial ablation which would also be a good choice. Finally discussed transvagina/laparoscopic/open hysterectomy but repeatedly emphasized this is likely not a good or even realistic option for her.  ? ?Finally discussed that while she is awaiting an appointment with a Gyn surgeon we should obtain a TVUS to evaluate  pelvic anatomy more clearly. Also advised patient she needs a pap smear and EMB at next visit, she is OK with this.  ? ?Given handouts on all of the above, return in 4-6 weeks to discuss surgical options.  ? ?  ?  ? Relevant Orders  ? US PELVIC COMPLETE WITH TRANSVAGINAL  ? ? ?Routine preventative health maintenance measures emphasized. ?Please refer to After Visit Summary for other counseling recommendations.  ? ?Return in about 4 weeks (around  09/28/2021) for surgical consultation for abnormal uterine bleeding, needs pap, EMB.   ? ?Total face-to-face time with patient: 25 minutes.  Over 50% of encounter was spent on counseling and coordination of care. ? ? ?Venora Maples, MD/MPH ?Attending Family Medicine Physician, Faculty Practice ?Center for [employer], [facility] ? ?

## 2021-08-31 NOTE — Patient Instructions (Addendum)

## 2021-09-05 ENCOUNTER — Encounter: Payer: Self-pay | Admitting: *Deleted

## 2021-10-20 ENCOUNTER — Other Ambulatory Visit: Payer: Self-pay | Admitting: *Deleted

## 2021-10-20 DIAGNOSIS — Z8719 Personal history of other diseases of the digestive system: Secondary | ICD-10-CM

## 2021-10-24 NOTE — Progress Notes (Signed)
GYNECOLOGY OFFICE VISIT NOTE  History:   Monica Holland is a 36 y.o. [id number] here today for EMB/pap/consultation following her appointment with Dr. Crissie Reese. I reviewed his note which was detailed.   She has a h/o recurrent VTE and is on lifelong anticoagulation. She had been a prior smoker but quite 2 weeks prior to Dr. Audie Clear appointment with her. He recommended an [place] prior to her appointment. She has not yet had her [place] - was never scheduled. Will schedule for her today.    Her hgb is well compensated at 14.5 in March with normal RDW.   She denies any abnormal vaginal discharge, bleeding, pelvic pain or other concerns.     Past Medical History:  Diagnosis Date   DVT (deep venous thrombosis) [facility])     Past Surgical History:  Procedure Laterality Date   ANKLE SURGERY Right Jan 2014    The following portions of the patient's history were reviewed and updated as appropriate: allergies, current medications, past family history, past medical history, past social history, past surgical history and problem list.   Health Maintenance:   No recent pap on file Review of Systems:  Pertinent items noted in HPI and remainder of comprehensive ROS otherwise negative.  Physical Exam:  BP (!) 133/100   Pulse (!) 107   Ht 5\' 11"  (1.803 m)   Wt 170 lb (77.1 kg)   LMP 10/04/2021 (Within Days)   BMI 23.71 kg/m  CONSTITUTIONAL: Well-developed, well-nourished female in no acute distress.  HEENT:  Normocephalic, atraumatic. External right and left ear normal. No scleral icterus.  NECK: Normal range of motion, supple, no masses noted on observation SKIN: No rash noted. Not diaphoretic. No erythema. No pallor. MUSCULOSKELETAL: Normal range of motion. No edema noted. NEUROLOGIC: Alert and oriented to person, place, and time. Normal muscle tone coordination. No cranial nerve deficit noted. PSYCHIATRIC: Normal mood and affect. Normal behavior. Normal judgment and thought  content.  CARDIOVASCULAR: Normal heart rate noted RESPIRATORY: Effort and breath sounds normal, no problems with respiration noted ABDOMEN: No masses noted. No other overt distention noted.    PELVIC: Normal appearing external genitalia; normal urethral meatus; normal appearing vaginal mucosa and cervix.  No abnormal discharge noted.  Normal uterine size, no other palpable masses, no uterine or adnexal tenderness. Performed in the presence of a chaperone  Labs and Imaging No results found for this or any previous visit (from the past 168 hour(s)). No results found.     GYNECOLOGY OFFICE PROCEDURE NOTE   Monica Holland is a 36 y.o. G4P4000 here for endometrial biopsy for AUB in the setting of anticoagulation.   ENDOMETRIAL BIOPSY     The indications for endometrial biopsy were reviewed.   Risks of the biopsy including cramping, bleeding, infection, uterine perforation, inadequate specimen and need for additional procedures were discussed. Offered alternative of hysteroscopy, dilation and curettage in OR. The patient states she understands the R/B/I/A and agrees to undergo procedure today. Urine pregnancy test was Not indicated - same sex relationship. Consent was signed. Time out was performed.    Patient was positioned in dorsal lithotomy position. A vaginal speculum was placed.  The cervix was visualized and was prepped with Betadine.  A single-toothed tenaculum was placed on the anterior lip of the cervix to stabilize it. The 3 mm pipelle was easily introduced into the endometrial cavity without difficulty to a depth of 9 cm, and a Moderate amount of tissue was obtained after two passes and sent  to pathology. The instruments were removed from the patient's vagina. Minimal bleeding from the cervix was noted. The patient tolerated the procedure well.   Patient was given post procedure instructions.  Will follow up pathology and manage accordingly; patient will be contacted with results and  recommendations.  Routine preventative health maintenance measures emphasized.  Assessment and Plan:   1. Abnormal uterine bleeding (AUB) - Patient still needs [place] to best delineate which options would work best for her that may be non-invasive. I would recommend non-invasive option prior to major abdominal surgery.  - Reiterated options of IUD, endometrial ablation, and potentially [place] but would be dependent on US findings. She also could do IUD at the time of ablation.  - If other options fail, surgery could be coordinated but would be high risk due to need to come off Eliquis. She would be an excellent candidate in that case for TVH.  -     Cytology - PAP( [place]) -     Surgical pathology( / [facility])    Routine preventative health maintenance measures emphasized. Please refer to After Visit Summary for other counseling recommendations.   Return in about 2 weeks (around 11/09/2021) for Follow up. (After she has had her [place] on 8/2)  Monica Hock, MD, FACOG Obstetrician & Gynecologist, [facility] for [employer], [facility]

## 2021-10-26 ENCOUNTER — Other Ambulatory Visit (HOSPITAL_COMMUNITY)
Admission: RE | Admit: 2021-10-26 | Discharge: 2021-10-26 | Disposition: A | Discharge status: Home / Self Care | Payer: Medicaid Other | Source: Ambulatory Visit | Attending: Obstetrics and Gynecology | Admitting: Obstetrics and Gynecology

## 2021-10-26 ENCOUNTER — Ambulatory Visit (INDEPENDENT_AMBULATORY_CARE_PROVIDER_SITE_OTHER): Payer: Medicaid Other | Admitting: Obstetrics and Gynecology

## 2021-10-26 ENCOUNTER — Encounter: Payer: Self-pay | Admitting: Obstetrics and Gynecology

## 2021-10-26 ENCOUNTER — Other Ambulatory Visit: Payer: Self-pay

## 2021-10-26 VITALS — BP 133/100 | HR 107 | Ht 71.0 in | Wt 170.0 lb

## 2021-10-26 DIAGNOSIS — N939 Abnormal uterine and vaginal bleeding, unspecified: Secondary | ICD-10-CM | POA: Insufficient documentation

## 2021-10-28 LAB — SURGICAL PATHOLOGY

## 2021-10-29 ENCOUNTER — Encounter: Payer: Self-pay | Admitting: Obstetrics and Gynecology

## 2021-11-03 LAB — CYTOLOGY - PAP
Comment: NEGATIVE
Diagnosis: NEGATIVE
Diagnosis: REACTIVE
High risk HPV: NEGATIVE

## 2021-11-04 ENCOUNTER — Encounter: Payer: Self-pay | Admitting: Physician Assistant

## 2021-11-15 ENCOUNTER — Ambulatory Visit (INDEPENDENT_AMBULATORY_CARE_PROVIDER_SITE_OTHER): Payer: Medicaid Other | Admitting: Physician Assistant

## 2021-11-15 ENCOUNTER — Encounter: Payer: Self-pay | Admitting: Physician Assistant

## 2021-11-15 ENCOUNTER — Telehealth: Payer: Self-pay

## 2021-11-15 VITALS — BP 120/82 | HR 75 | Ht 71.0 in | Wt 171.0 lb

## 2021-11-15 DIAGNOSIS — R1013 Epigastric pain: Secondary | ICD-10-CM | POA: Diagnosis not present

## 2021-11-15 DIAGNOSIS — K5900 Constipation, unspecified: Secondary | ICD-10-CM | POA: Diagnosis not present

## 2021-11-15 DIAGNOSIS — K219 Gastro-esophageal reflux disease without esophagitis: Secondary | ICD-10-CM | POA: Diagnosis not present

## 2021-11-15 DIAGNOSIS — R112 Nausea with vomiting, unspecified: Secondary | ICD-10-CM

## 2021-11-15 MED ORDER — PANTOPRAZOLE SODIUM 40 MG PO TBEC: 40.0000 mg | DELAYED_RELEASE_TABLET | Freq: Two times a day (BID) | ORAL | 5 refills | Status: DC

## 2021-11-15 MED ORDER — ONDANSETRON 4 MG PO TBDP: 4.0000 mg | ORAL_TABLET | Freq: Three times a day (TID) | ORAL | 1 refills | Status: DC | PRN

## 2021-11-15 MED ORDER — FAMOTIDINE 40 MG PO TABS: 40.0000 mg | ORAL_TABLET | Freq: Two times a day (BID) | ORAL | 5 refills | Status: DC

## 2021-11-15 NOTE — Patient Instructions (Signed)
If you are age 38 or older, your body mass index should be between 23-30. Your Body mass index is 23.85 kg/m. If this is out of the aforementioned range listed, please consider follow up with your Primary Care Provider.  If you are age 13 or younger, your body mass index should be between 19-25. Your Body mass index is 23.85 kg/m. If this is out of the aformentioned range listed, please consider follow up with your Primary Care Provider.   It has been recommended to you by your physician that you have a(n) Endoscopy completed. Per your request, we did not schedule the procedure(s) today. Please contact our office at [phone] should you decide to have the procedure completed. You will be scheduled for a pre-visit and procedure at that time.  We have sent the following medications to your pharmacy for you to pick up at your convenience: Pantoprazole 40 mg twice daily , Peocid 40 mg twice daily .  Try Miralax once daily for constipation.  The [place] [facility] providers would like to encourage you to use [facility] to communicate with providers for non-urgent requests or questions.  Due to long hold times on the telephone, sending your provider a message by [facility] may be a faster and more efficient way to get a response.  Please allow 48 business hours for a response.  Please remember that this is for non-urgent requests.   It was a pleasure to see you today!  Thank you for trusting me with your gastrointestinal care!    Hyacinth Meeker, PA-C

## 2021-11-15 NOTE — Telephone Encounter (Signed)
[facility] Pre-operative Risk Assessment     Request for surgical clearance:     Endoscopy Procedure  What type of surgery is being performed?     Endoscopy   When is this surgery scheduled?     TBD   What type of clearance is required ?   Pharmacy  Are there any medications that need to be held prior to surgery and how long? Eliquis 2 days   Practice name and name of physician performing surgery?      [facility]  What is your office phone and fax number?      Phone- [phone]  [phone]  Anesthesia type (None, local, MAC, general) ?       MAC

## 2021-11-15 NOTE — Progress Notes (Signed)
I agree with the assessment and plan as outlined by Ms. Lemmon. 

## 2021-11-15 NOTE — Progress Notes (Signed)
Chief Complaint: GERD, epigastric pain, nausea and vomiting  HPI:    Monica Holland is a 36 year old female with a past medical history as listed below including DVT on Eliquis, who was referred to me by Ellene Route, MD for a complaint of GERD, epigastric pain, nausea and vomiting.      07/08/2021 patient seen in the ED for upper abdominal pain.  CT of the abdomen pelvis with no acute findings.  CBC normal.  CMP with minimally decreased sodium at 133 and glucose of 106.  During visit patient was given GI cocktail with no relief and given Dilaudid with some improvement in pain.  She was given a prescription for Prevacid, Zofran and hydrocodone.    Today, patient presents to clinic accompanied by her friend and tells me that she continues with epigastric pain and nausea as well as occasional episodes of vomiting.  Tells me that when she has acute epigastric pain is very sharp and intense, rated as a 9-10/10,  but then the last for couple of minutes where it just doubles her over, that occurs about 2-3 times a week but most days she feels all of the other symptoms.  Everything is slightly better with Pepcid 20 mg every morning and Pantoprazole 40 mg every morning as well as Zofran 4 mg as needed.  Tells me that the Zofran occasionally helps with the nausea and other times it does not.      Does admit to smoking marijuana and has done so multiple times a day since she was 16.  Tells me now though she maybe only smokes marijuana couple days out of the week.    Also describes a change to constipation lately.  She is afraid to take laxatives because she cannot miss work.    Denies fever, chills, weight loss or blood in her stool.     Past Medical History:  Diagnosis Date   DVT (deep venous thrombosis) (HCC)     Past Surgical History:  Procedure Laterality Date   ANKLE SURGERY Right Jan 2014    Current Outpatient Medications  Medication Sig Dispense Refill   apixaban (ELIQUIS) 2.5 MG TABS tablet  Eliquis 2.5 mg tablet  Take 1 tablet twice a day by oral route for 30 days.     azelastine (ASTELIN) 0.1 % nasal spray Place 2 sprays into both nostrils 2 (two) times daily.     fluticasone (FLONASE) 50 MCG/ACT nasal spray Place 1 spray into both nostrils daily.     lansoprazole (PREVACID) 30 MG capsule Take 1 capsule (30 mg total) by mouth daily. 30 capsule 1   ondansetron (ZOFRAN-ODT) 4 MG disintegrating tablet Take 1 tablet (4 mg total) by mouth every 8 (eight) hours as needed for nausea or vomiting. 10 tablet 0   No current facility-administered medications for this visit.    Allergies as of 11/15/2021   (No Known Allergies)    Family History  Problem Relation Age of Onset   Deep vein thrombosis Mother    Hyperlipidemia Mother    Diabetes Mother    High Cholesterol Mother     Social History   Socioeconomic History   Marital status: Single    Spouse name: Not on file   Number of children: Not on file   Years of education: Not on file   Highest education level: Not on file  Occupational History   Not on file  Tobacco Use   Smoking status: Former    Packs/day: 0.50  Types: Cigarettes   Smokeless tobacco: Never  Vaping Use   Vaping Use: Never used  Substance and Sexual Activity   Alcohol use: Yes    Comment: weekends (a lot)   Drug use: Not Currently    Types: Marijuana   Sexual activity: Yes    Birth control/protection: None  Other Topics Concern   Not on file  Social History Narrative   Not on file   Social Determinants of Health   Financial Resource Strain: Not on file  Food Insecurity: No Food Insecurity (10/26/2021)   Hunger Vital Sign    Worried About Running Out of Food in the Last Year: Never true    Ran Out of Food in the Last Year: Never true  Transportation Needs: No Transportation Needs (10/26/2021)   PRAPARE - Administrator, Civil Service (Medical): No    Lack of Transportation (Non-Medical): No  Physical Activity: Not on file   Stress: Not on file  Social Connections: Not on file  Intimate Partner Violence: Not on file    Review of Systems:    Constitutional: No weight loss, fever or chills Skin: No rash Cardiovascular: No chest pain  Respiratory: No SOB  Gastrointestinal: See HPI and otherwise negative Genitourinary: No dysuria Neurological: No headache, dizziness or syncope Musculoskeletal: No new muscle or joint pain Hematologic: No bleeding  Psychiatric: No history of depression or anxiety   Physical Exam:  Vital signs: BP 120/82   Pulse 75   Ht 5\' 11"  (1.803 m)   Wt 171 lb (77.6 kg)   LMP 10/04/2021 (Within Days)   BMI 23.85 kg/m    Constitutional:  AA female appears to be in NAD, Well developed, Well nourished, alert and cooperative Head:  Normocephalic and atraumatic. Eyes:   PEERL, EOMI. No icterus. Conjunctiva pink. Ears:  Normal auditory acuity. Neck:  Supple Throat: Oral cavity and pharynx without inflammation, swelling or lesion.  Respiratory: Respirations even and unlabored. Lungs clear to auscultation bilaterally.   No wheezes, crackles, or rhonchi.  Cardiovascular: Normal S1, S2. No MRG. Regular rate and rhythm. No peripheral edema, cyanosis or pallor.  Gastrointestinal:  Soft, nondistended, mild-moderate epigastric ttp. No rebound or guarding. Normal bowel sounds. No appreciable masses or hepatomegaly. Rectal:  Not performed.  Msk:  Symmetrical without gross deformities. Without edema, no deformity or joint abnormality.  Neurologic:  Alert and  oriented x4;  grossly normal neurologically.  Skin:   Dry and intact without significant lesions or rashes. Psychiatric:  Demonstrates good judgement and reason without abnormal affect or behaviors.  RELEVANT LABS AND IMAGING: CBC    Component Value Date/Time   WBC 4.8 07/08/2021 0900   RBC 5.14 (H) 07/08/2021 0900   HGB 14.5 07/08/2021 0900   HGB 14.3 07/05/2021 1457   HGB 11.4 (L) 12/29/2008 1202   HCT 43.9 07/08/2021 0900   HCT  34.3 (L) 12/29/2008 1202   PLT 194 07/08/2021 0900   PLT 209 07/05/2021 1457   PLT 233 12/29/2008 1202   MCV 85.4 07/08/2021 0900   MCV 73.7 (L) 12/29/2008 1202   MCH 28.2 07/08/2021 0900   MCHC 33.0 07/08/2021 0900   RDW 14.6 07/08/2021 0900   RDW 17.7 (H) 12/29/2008 1202   LYMPHSABS 0.7 07/08/2021 0900   LYMPHSABS 1.7 12/29/2008 1202   MONOABS 0.3 07/08/2021 0900   MONOABS 0.2 12/29/2008 1202   EOSABS 0.1 07/08/2021 0900   EOSABS 0.1 12/29/2008 1202   BASOSABS 0.0 07/08/2021 0900   BASOSABS 0.0 12/29/2008  1202    CMP     Component Value Date/Time   NA 133 (L) 07/08/2021 0900   K 3.8 07/08/2021 0900   CL 105 07/08/2021 0900   CO2 23 07/08/2021 0900   GLUCOSE 106 (H) 07/08/2021 0900   BUN 10 07/08/2021 0900   CREATININE 0.77 07/08/2021 0900   CREATININE 0.88 07/05/2021 1457   CALCIUM 8.2 (L) 07/08/2021 0900   PROT 7.0 07/08/2021 0900   ALBUMIN 3.8 07/08/2021 0900   AST 17 07/08/2021 0900   AST 22 07/05/2021 1457   ALT 16 07/08/2021 0900   ALT 18 07/05/2021 1457   ALKPHOS 51 07/08/2021 0900   BILITOT 0.9 07/08/2021 0900   BILITOT 0.9 07/05/2021 1457   GFRNONAA >60 07/08/2021 0900   GFRNONAA >60 07/05/2021 1457   GFRAA 88 (L) 03/10/2013 0635    Assessment: 1.  Epigastric pain: CT and labs normal, some improvement with an acid medicine, also admits to frequent marijuana use; consider marijuana use versus gastritis versus H. pylori 2.  Nausea and vomiting: With above 3.  History of DVT: On chronic anticoagulation with Eliquis 4.  Constipation: Over the past few weeks  Plan: 1.  Discussed with patient that it may be her marijuana that is causing her symptoms.  Recommend that she take a 1 to 58-month hiatus from smoking marijuana to see if it helps.  2.  Scheduled patient for diagnostic EGD in the [facility] with Dr. [name].  Did provide the patient a detailed list of risks for the procedure and she agrees to proceed. Patient is appropriate for endoscopic procedure(s) in the  ambulatory ([facility]) setting.  3.  Increase Pantoprazole to 40 mg twice daily, 30-60 minutes before breakfast and dinner.  Prescribed #60 with 3 refills. 4.  Increase Pepcid to 40 mg twice daily, every morning and nightly #60 with 3 refills. 5.  Refill Zofran 4 mg every 4-6 hours as needed for nausea #30 with 2 refills. 6.  Patient was advised to hold her Eliquis for 2 days prior to time of procedure.  We will communicate with her prescribing physician to ensure this is acceptable for her. 7.  Recommend the patient start MiraLAX daily for constipation 8.  Patient follow in clinic per recommendations after time of procedure.  Hyacinth Meeker, PA-C [facility] 11/15/2021, 2:42 PM  Cc: Ellene Route, MD

## 2021-11-16 ENCOUNTER — Ambulatory Visit: Payer: Medicaid Other

## 2021-11-16 NOTE — Telephone Encounter (Signed)
   Patient Name: Monica Holland  DOB: 1986-02-16 MRN: [id number]  Primary Cardiologist: Thurmon Fair, MD  Chart reviewed as part of pre-operative protocol coverage. Pre-op clearance already addressed by colleagues in earlier phone notes. To summarize recommendations:  -Per office protocol, patient can hold Eliquis for 2 days prior to procedure.   Patient will not need bridging with Lovenox (enoxaparin) around procedure.  Will route this bundled recommendation to requesting provider via Epic fax function and remove from pre-op pool. Please call with questions.  Sharlene Dory, PA-C 11/16/2021, 4:41 PM

## 2021-11-16 NOTE — Telephone Encounter (Signed)
Patient with diagnosis of DVT on Eliquis for anticoagulation.    Procedure: endoscopy Date of procedure: TBD   CrCl 123 Platelet count 194  Per office protocol, patient can hold Eliquis for 2 days prior to procedure.   Patient will not need bridging with Lovenox (enoxaparin) around procedure.  **This guidance is not considered finalized until pre-operative APP has relayed final recommendations.**

## 2021-11-22 NOTE — Telephone Encounter (Signed)
Patient has been notified and aware of instructions for holding her Eliquis. She has been scheduled for an EGD on 12-29-2021. Instructions have been sent via mychart as well as a mailed copy.

## 2021-11-26 NOTE — Progress Notes (Unsigned)
   GYNECOLOGY OFFICE VISIT NOTE  History:   Monica Holland is a 36 y.o. G4P4000 here today for preop discussion.  01/2021 [place] was wnl. No anatomic reason for bleeding.  10/2021: EMB negative 10/2021: pap wnl, HPV neg  She is seeking surgery for irregular, heavy and prolonged bleeding on eliquis due to history of recurrent DVT.     Past Medical History:  Diagnosis Date   DVT (deep venous thrombosis) [facility])     Past Surgical History:  Procedure Laterality Date   ANKLE SURGERY Right Jan 2014    The following portions of the patient's history were reviewed and updated as appropriate: allergies, current medications, past family history, past medical history, past social history, past surgical history and problem list.   Health Maintenance:   Diagnosis  Date Value Ref Range Status  10/26/2021   Final   - Negative for Intraepithelial Lesions or Malignancy (NILM)  10/26/2021 - Benign reactive/reparative changes  Final     Review of Systems:  Pertinent items noted in HPI and remainder of comprehensive ROS otherwise negative.  Physical Exam:  There were no vitals taken for this visit. CONSTITUTIONAL: Well-developed, well-nourished female in no acute distress.  HEENT:  Normocephalic, atraumatic. External right and left ear normal. No scleral icterus.  NECK: Normal range of motion, supple, no masses noted on observation SKIN: No rash noted. Not diaphoretic. No erythema. No pallor. MUSCULOSKELETAL: Normal range of motion. No edema noted. NEUROLOGIC: Alert and oriented to person, place, and time. Normal muscle tone coordination. No cranial nerve deficit noted. PSYCHIATRIC: Normal mood and affect. Normal behavior. Normal judgment and thought content.  CARDIOVASCULAR: Normal heart rate noted RESPIRATORY: Effort and breath sounds normal, no problems with respiration noted ABDOMEN: No masses noted. No other overt distention noted.    PELVIC: Deferred  Labs and Imaging No results  found for this or any previous visit (from the past 168 hour(s)). No results found.  Assessment and Plan:  Diagnoses and all orders for this visit:  Preop examination  Abnormal uterine bleeding (AUB)  - Reviewed options and the risks and benefits of each. She would like *** - Discussed this option in additional detail -  ***  Routine preventative health maintenance measures emphasized. Please refer to After Visit Summary for other counseling recommendations.   No follow-ups on file.  Milas Hock, MD, FACOG Obstetrician & Gynecologist, [facility] for [facility], [facility]

## 2021-11-28 ENCOUNTER — Ambulatory Visit (INDEPENDENT_AMBULATORY_CARE_PROVIDER_SITE_OTHER): Payer: Medicaid Other | Admitting: Obstetrics and Gynecology

## 2021-11-28 ENCOUNTER — Encounter: Payer: Self-pay | Admitting: Obstetrics and Gynecology

## 2021-11-28 ENCOUNTER — Other Ambulatory Visit: Payer: Self-pay

## 2021-11-28 VITALS — BP 129/86 | HR 87 | Wt 160.0 lb

## 2021-11-28 DIAGNOSIS — N939 Abnormal uterine and vaginal bleeding, unspecified: Secondary | ICD-10-CM | POA: Diagnosis not present

## 2021-11-28 DIAGNOSIS — Z01818 Encounter for other preprocedural examination: Secondary | ICD-10-CM | POA: Diagnosis not present

## 2021-11-30 MED ORDER — NAPROXEN 500 MG PO TABS: 500.0000 mg | ORAL_TABLET | Freq: Two times a day (BID) | ORAL | 0 refills | Status: AC

## 2021-12-25 ENCOUNTER — Encounter: Payer: Self-pay | Admitting: Certified Registered Nurse Anesthetist

## 2021-12-29 ENCOUNTER — Encounter: Payer: Self-pay | Admitting: Internal Medicine

## 2021-12-29 ENCOUNTER — Ambulatory Visit (AMBULATORY_SURGERY_CENTER): Payer: Medicaid Other | Admitting: Internal Medicine

## 2021-12-29 VITALS — BP 124/85 | HR 57 | Temp 98.0°F | Resp 13 | Ht 71.0 in | Wt 171.0 lb

## 2021-12-29 DIAGNOSIS — K319 Disease of stomach and duodenum, unspecified: Secondary | ICD-10-CM | POA: Diagnosis not present

## 2021-12-29 DIAGNOSIS — K219 Gastro-esophageal reflux disease without esophagitis: Secondary | ICD-10-CM | POA: Diagnosis not present

## 2021-12-29 DIAGNOSIS — R1013 Epigastric pain: Secondary | ICD-10-CM

## 2021-12-29 DIAGNOSIS — K2960 Other gastritis without bleeding: Secondary | ICD-10-CM | POA: Diagnosis not present

## 2021-12-29 HISTORY — PX: ESOPHAGOGASTRODUODENOSCOPY (EGD) WITH PROPOFOL: SHX5813

## 2021-12-29 MED ORDER — SODIUM CHLORIDE 0.9 % IV SOLN: 500.0000 mL | Freq: Once | INTRAVENOUS | Status: DC

## 2021-12-29 MED ORDER — FAMOTIDINE 20 MG PO TABS: 20.0000 mg | ORAL_TABLET | Freq: Two times a day (BID) | ORAL | 2 refills | Status: DC

## 2021-12-29 NOTE — Progress Notes (Signed)
Called to room to assist during endoscopic procedure.  Patient ID and intended procedure confirmed with present staff. Received instructions for my participation in the procedure from the performing physician.  

## 2021-12-29 NOTE — Patient Instructions (Addendum)
Thank you for coming in to see [place] today! Resume previous diet and medications today. Continue your Pantoprazole 40 mg twice per day,  Decrease Pepcid from 40 mg twice per day to 20 mg twice per day. Restart ELIQUIS tomorrow, 12/30/21 Return to your daily activities tomorrow. Return to GI clinic in 4 weeks.   YOU HAD AN ENDOSCOPIC PROCEDURE TODAY AT THE [facility]:   Refer to the procedure report that was given to you for any specific questions about what was found during the examination.  If the procedure report does not answer your questions, please call your gastroenterologist to clarify.  If you requested that your care partner not be given the details of your procedure findings, then the procedure report has been included in a sealed envelope for you to review at your convenience later.  YOU SHOULD EXPECT: Some feelings of bloating in the abdomen. Passage of more gas than usual.  Walking can help get rid of the air that was put into your GI tract during the procedure and reduce the bloating. If you had a lower endoscopy (such as a colonoscopy or flexible sigmoidoscopy) you may notice spotting of blood in your stool or on the toilet paper. If you underwent a bowel prep for your procedure, you may not have a normal bowel movement for a few days.  Please Note:  You might notice some irritation and congestion in your nose or some drainage.  This is from the oxygen used during your procedure.  There is no need for concern and it should clear up in a day or so.  SYMPTOMS TO REPORT IMMEDIATELY:  Following upper endoscopy (EGD)  Vomiting of blood or coffee ground material  New chest pain or pain under the shoulder blades  Painful or persistently difficult swallowing  New shortness of breath  Fever of 100F or higher  Black, tarry-looking stools  For urgent or emergent issues, a gastroenterologist can be reached at any hour by calling [phone]. Do not use MyChart messaging for  urgent concerns.    DIET:  We do recommend a small meal at first, but then you may proceed to your regular diet.  Drink plenty of fluids but you should avoid alcoholic beverages for 24 hours.  ACTIVITY:  You should plan to take it easy for the rest of today and you should NOT DRIVE or use heavy machinery until tomorrow (because of the sedation medicines used during the test).    FOLLOW UP: Our staff will call the number listed on your records the next business day following your procedure.  We will call around 7:15- 8:00 am to check on you and address any questions or concerns that you may have regarding the information given to you following your procedure. If we do not reach you, we will leave a message.     If any biopsies were taken you will be contacted by phone or by letter within the next 1-3 weeks.  Please call us at [phone] if you have not heard about the biopsies in 3 weeks.    SIGNATURES/CONFIDENTIALITY: You and/or your care partner have signed paperwork which will be entered into your electronic medical record.  These signatures attest to the fact that that the information above on your After Visit Summary has been reviewed and is understood.  Full responsibility of the confidentiality of this discharge information lies with you and/or your care-partner.

## 2021-12-29 NOTE — Op Note (Addendum)
[facility] Patient Name: Monica Holland Procedure Date: 12/29/2021 7:18 AM MRN: [id number] Endoscopist: Nicole Kindred "Monica Holland ,  Age: 37 Referring MD:  Date of Birth: [birth year] Gender: Female Account #: [id number] Procedure:                Upper GI endoscopy Indications:              Epigastric abdominal pain, Heartburn, Nausea with                            vomiting Medicines:                Monitored Anesthesia Care Procedure:                Pre-Anesthesia Assessment:                           - Prior to the procedure, a History and Physical                            was performed, and patient medications and                            allergies were reviewed. The patient's tolerance of                            previous anesthesia was also reviewed. The risks                            and benefits of the procedure and the sedation                            options and risks were discussed with the patient.                            All questions were answered, and informed consent                            was obtained. Prior Anticoagulants: The patient has                            taken Eliquis (apixaban), last dose was 3 days                            prior to procedure. ASA Grade Assessment: II - A                            patient with mild systemic disease. After reviewing                            the risks and benefits, the patient was deemed in                            satisfactory condition to undergo the procedure.  After obtaining informed consent, the endoscope was                            passed under direct vision. Throughout the                            procedure, the patient's blood pressure, pulse, and                            oxygen saturations were monitored continuously. The                            Endoscope was introduced through the mouth, and                            advanced to the second part of  duodenum. The upper                            GI endoscopy was accomplished without difficulty.                            The patient tolerated the procedure well. Scope In: Scope Out: Findings:                 The examined esophagus was normal. Biopsies were                            taken with a cold forceps for histology.                           Localized mild inflammation characterized by                            congestion (edema) and erythema was found in the                            gastric antrum. Biopsies were taken with a cold                            forceps for histology.                           The examined duodenum was normal. Biopsies were                            taken with a cold forceps for histology. Complications:            No immediate complications. Estimated Blood Loss:     Estimated blood loss was minimal. Impression:               - Normal esophagus. Biopsied.                           - Gastritis. Biopsied.                           -  Normal examined duodenum. Biopsied. Recommendation:           - Discharge patient to home (with escort).                           - Continue your pantoprazole 40 mg BID. Decrease                            Pepcid from 40 mg BID to 20 mg BID.                           - Await pathology results.                           - Okay to restart your Eliquis tomorrow.                           - The findings and recommendations were discussed                            with the patient.                           - Return to GI clinic in 4 weeks. Dr Particia Lather "Monica Holland" Monica Holland,  12/29/2021 8:30:26 AM

## 2021-12-29 NOTE — Progress Notes (Signed)
GASTROENTEROLOGY PROCEDURE H&P NOTE   Primary Care Physician: Ellene Route, MD    Reason for Procedure:  Epigastric ab pain, N&V, GERD  Plan:    EGD  Patient is appropriate for endoscopic procedure(s) in the ambulatory ([facility]) setting.  The nature of the procedure, as well as the risks, benefits, and alternatives were carefully and thoroughly reviewed with the patient. Ample time for discussion and questions allowed. The patient understood, was satisfied, and agreed to proceed.     HPI: Monica Holland is a 36 y.o. female who presents for EGD for evaluation of epigastric ab pain, N&V, and GERD .  Patient was most recently seen in the Gastroenterology Clinic on 11/15/21.  No interval change in medical history since that appointment. Please refer to that note for full details regarding GI history and clinical presentation.   Past Medical History:  Diagnosis Date   DVT (deep venous thrombosis) [facility])     Past Surgical History:  Procedure Laterality Date   ANKLE SURGERY Right Jan 2014    Prior to Admission medications   Medication Sig Start Date End Date Taking? Authorizing Provider  famotidine (PEPCID) 40 MG tablet Take 1 tablet (40 mg total) by mouth 2 (two) times daily. 11/15/21  Yes Unk Lightning, PA  lansoprazole (PREVACID) 30 MG capsule Take 1 capsule (30 mg total) by mouth daily. 07/08/21 07/08/22 Yes Cheron Schaumann K, PA-C  ondansetron (ZOFRAN-ODT) 4 MG disintegrating tablet Take 1 tablet (4 mg total) by mouth every 8 (eight) hours as needed for nausea or vomiting. 11/15/21  Yes Unk Lightning, PA  pantoprazole (PROTONIX) 40 MG tablet Take 1 tablet (40 mg total) by mouth 2 (two) times daily. 11/15/21  Yes Unk Lightning, PA  apixaban (ELIQUIS) 2.5 MG TABS tablet Eliquis 2.5 mg tablet  Take 1 tablet twice a day by oral route for 30 days. 06/27/21   [provider]  azelastine (ASTELIN) 0.1 % nasal spray Place 2 sprays into both nostrils 2 (two)  times daily. 08/16/21   [provider]  fluticasone (FLONASE) 50 MCG/ACT nasal spray Place 1 spray into both nostrils daily. 08/16/21   [provider]  naproxen (NAPROSYN) 500 MG tablet Take 1 tablet (500 mg total) by mouth 2 (two) times daily with a meal. Patient not taking: Reported on 12/29/2021 11/30/21 12/30/21  Milas Hock, MD  Rivaroxaban 15 & 20 MG TBPK Take as directed on package: Start with one 15mg  tablet by mouth twice a day with food. On Day 22, switch to one 20mg  tablet once a day with food. Patient not taking: Reported on 04/02/2020 03/05/13 06/25/20  03/07/13, MD    Current Outpatient Medications  Medication Sig Dispense Refill   famotidine (PEPCID) 40 MG tablet Take 1 tablet (40 mg total) by mouth 2 (two) times daily. 60 tablet 5   lansoprazole (PREVACID) 30 MG capsule Take 1 capsule (30 mg total) by mouth daily. 30 capsule 1   ondansetron (ZOFRAN-ODT) 4 MG disintegrating tablet Take 1 tablet (4 mg total) by mouth every 8 (eight) hours as needed for nausea or vomiting. 30 tablet 1   pantoprazole (PROTONIX) 40 MG tablet Take 1 tablet (40 mg total) by mouth 2 (two) times daily. 60 tablet 5   apixaban (ELIQUIS) 2.5 MG TABS tablet Eliquis 2.5 mg tablet  Take 1 tablet twice a day by oral route for 30 days.     azelastine (ASTELIN) 0.1 % nasal spray Place 2 sprays into both nostrils 2 (  two) times daily.     fluticasone (FLONASE) 50 MCG/ACT nasal spray Place 1 spray into both nostrils daily.     naproxen (NAPROSYN) 500 MG tablet Take 1 tablet (500 mg total) by mouth 2 (two) times daily with a meal. (Patient not taking: Reported on 12/29/2021) 60 tablet 0   Current Facility-Administered Medications  Medication Dose Route Frequency Provider Last Rate Last Admin   0.9 %  sodium chloride infusion  500 mL Intravenous Once Imogene Burn, MD        Allergies as of 12/29/2021   (No Known Allergies)    Family History  Problem Relation Age of Onset   Deep vein  thrombosis Mother    Hyperlipidemia Mother    Diabetes Mother    High Cholesterol Mother    Stomach cancer Neg Hx    Colon cancer Neg Hx    Esophageal cancer Neg Hx    Rectal cancer Neg Hx     Social History   Socioeconomic History   Marital status: Single    Spouse name: Not on file   Number of children: Not on file   Years of education: Not on file   Highest education level: Not on file  Occupational History   Occupation: International aid/development worker  Tobacco Use   Smoking status: Every Day    Packs/day: 0.50    Types: Cigarettes   Smokeless tobacco: Never  Vaping Use   Vaping Use: Some days  Substance and Sexual Activity   Alcohol use: Yes    Comment: 3 beers a week   Drug use: Not Currently    Types: Marijuana    Comment: last time used was "the other day" per patient   Sexual activity: Yes    Birth control/protection: None  Other Topics Concern   Not on file  Social History Narrative   Not on file   Social Determinants of Health   Financial Resource Strain: Not on file  Food Insecurity: No Food Insecurity (10/26/2021)   Hunger Vital Sign    Worried About Running Out of Food in the Last Year: Never true    Ran Out of Food in the Last Year: Never true  Transportation Needs: No Transportation Needs (10/26/2021)   PRAPARE - Administrator, Civil Service (Medical): No    Lack of Transportation (Non-Medical): No  Physical Activity: Not on file  Stress: Not on file  Social Connections: Not on file  Intimate Partner Violence: Not on file    Physical Exam: Vital signs in last 24 hours: BP 122/67   Pulse 68   Temp 98 F (36.7 C) (Temporal)   Ht 5\' 11"  (1.803 m)   Wt 171 lb (77.6 kg)   SpO2 100%   BMI 23.85 kg/m  GEN: NAD EYE: Sclerae anicteric ENT: MMM CV: Non-tachycardic Pulm: No increased WOB GI: Soft NEURO:  Alert & Oriented   , MD [facility]   12/29/2021 8:11 AM

## 2021-12-29 NOTE — Progress Notes (Signed)
0804 Robinul 0.1 mg IV given due large amount of secretions upon assessment.  MD made aware, vss 

## 2021-12-30 ENCOUNTER — Encounter (HOSPITAL_BASED_OUTPATIENT_CLINIC_OR_DEPARTMENT_OTHER): Payer: Self-pay | Admitting: Obstetrics and Gynecology

## 2021-12-30 ENCOUNTER — Telehealth: Payer: Self-pay | Admitting: *Deleted

## 2021-12-30 NOTE — Telephone Encounter (Signed)
  Follow up Call-     12/29/2021    7:17 AM  Call back number  Post procedure Call Back phone  # [phone]  Permission to leave phone message Yes     Patient questions:  Do you have a fever, pain , or abdominal swelling? No. Pain Score  0 *  Have you tolerated food without any problems? Yes.    Have you been able to return to your normal activities? Yes.    Do you have any questions about your discharge instructions: Diet   No. Medications  No. Follow up visit  No.  Do you have questions or concerns about your Care? No.  Actions: * If pain score is 4 or above: No action needed, pain <4.

## 2021-12-30 NOTE — Progress Notes (Signed)
Spoke w/ via phone for pre-op interview--- pt Lab needs dos----  cbc, urine preg             Lab results------ no COVID test -----patient states asymptomatic no test needed Arrive at ------- 1115 on 01-04-2022 NPO after MN NO Solid Food.  Clear liquids from MN until--- 1015 Med rec completed Medications to take morning of surgery ----- protonix, pepcid Diabetic medication ----- n/a Patient instructed no nail polish to be worn day of surgery Patient instructed to bring photo id and insurance card day of surgery Patient aware to have Driver (ride ) / caregiver for 24 hours after surgery --- sig other, ebony Patient Special Instructions ----- pt aware to continue eliquis but do not take morning of surgery  Pre-Op special Istructions ----- sent request via inbox message to dr Para March for eliquis clearance if pt was to stop prior to surgery.  Received message back from Dr Para March stated pt does not need to stop eliquis prior to this procedure (placed copy of message w/ chart).   Patient verbalized understanding of instructions that were given at this phone interview. Patient denies shortness of breath, chest pain, fever, cough at this phone interview.

## 2022-01-03 ENCOUNTER — Encounter: Payer: Self-pay | Admitting: Internal Medicine

## 2022-01-03 ENCOUNTER — Inpatient Hospital Stay: Payer: Medicaid Other

## 2022-01-03 ENCOUNTER — Encounter: Payer: Self-pay | Admitting: Family

## 2022-01-03 ENCOUNTER — Inpatient Hospital Stay: Payer: Medicaid Other | Attending: Family | Admitting: Family

## 2022-01-03 VITALS — BP 146/91 | HR 83 | Temp 98.2°F | Resp 18 | Ht 70.0 in | Wt 171.8 lb

## 2022-01-03 DIAGNOSIS — Z86718 Personal history of other venous thrombosis and embolism: Secondary | ICD-10-CM | POA: Insufficient documentation

## 2022-01-03 DIAGNOSIS — I82512 Chronic embolism and thrombosis of left femoral vein: Secondary | ICD-10-CM

## 2022-01-03 DIAGNOSIS — D6859 Other primary thrombophilia: Secondary | ICD-10-CM

## 2022-01-03 DIAGNOSIS — Z86711 Personal history of pulmonary embolism: Secondary | ICD-10-CM | POA: Insufficient documentation

## 2022-01-03 DIAGNOSIS — Z7901 Long term (current) use of anticoagulants: Secondary | ICD-10-CM | POA: Insufficient documentation

## 2022-01-03 DIAGNOSIS — Z832 Family history of diseases of the blood and blood-forming organs and certain disorders involving the immune mechanism: Secondary | ICD-10-CM | POA: Diagnosis not present

## 2022-01-03 LAB — CBC WITH DIFFERENTIAL (CANCER CENTER ONLY)
Abs Immature Granulocytes: 0.01 10*3/uL (ref 0.00–0.07)
Basophils Absolute: 0 10*3/uL (ref 0.0–0.1)
Basophils Relative: 1 %
Eosinophils Absolute: 0.1 10*3/uL (ref 0.0–0.5)
Eosinophils Relative: 3 %
HCT: 41.6 % (ref 36.0–46.0)
Hemoglobin: 13.5 g/dL (ref 12.0–15.0)
Immature Granulocytes: 0 %
Lymphocytes Relative: 52 %
Lymphs Abs: 2.1 10*3/uL (ref 0.7–4.0)
MCH: 27.5 pg (ref 26.0–34.0)
MCHC: 32.5 g/dL (ref 30.0–36.0)
MCV: 84.7 fL (ref 80.0–100.0)
Monocytes Absolute: 0.3 10*3/uL (ref 0.1–1.0)
Monocytes Relative: 8 %
Neutro Abs: 1.4 10*3/uL — ABNORMAL LOW (ref 1.7–7.7)
Neutrophils Relative %: 36 %
Platelet Count: 238 10*3/uL (ref 150–400)
RBC: 4.91 MIL/uL (ref 3.87–5.11)
RDW: 14.3 % (ref 11.5–15.5)
WBC Count: 3.9 10*3/uL — ABNORMAL LOW (ref 4.0–10.5)
nRBC: 0 % (ref 0.0–0.2)

## 2022-01-03 LAB — CMP (CANCER CENTER ONLY)
ALT: 12 U/L (ref 0–44)
AST: 14 U/L — ABNORMAL LOW (ref 15–41)
Albumin: 4.5 g/dL (ref 3.5–5.0)
Alkaline Phosphatase: 46 U/L (ref 38–126)
Anion gap: 7 (ref 5–15)
BUN: 7 mg/dL (ref 6–20)
CO2: 30 mmol/L (ref 22–32)
Calcium: 9.4 mg/dL (ref 8.9–10.3)
Chloride: 107 mmol/L (ref 98–111)
Creatinine: 0.93 mg/dL (ref 0.44–1.00)
GFR, Estimated: >60 mL/min (ref >60)
Glucose, Bld: 102 mg/dL — ABNORMAL HIGH (ref 70–99)
Potassium: 4.4 mmol/L (ref 3.5–5.1)
Sodium: 144 mmol/L (ref 135–145)
Total Bilirubin: 0.3 mg/dL (ref 0.3–1.2)
Total Protein: 7.3 g/dL (ref 6.5–8.1)

## 2022-01-03 LAB — LACTATE DEHYDROGENASE: LDH: 126 U/L (ref 98–192)

## 2022-01-03 NOTE — Progress Notes (Signed)
Pt made aware of later arrival time, changed to 1215.

## 2022-01-03 NOTE — Progress Notes (Signed)
Hematology and Oncology Follow Up Visit  Monica Holland [id number] 05-16-1985 36 y.o. 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 Overall she is doing well. She is scheduled for [facility] with ablation tomorrow with Dr. Radene Gunning. She has been holding her Eliquis for the last day and will restart the day after her procedure.  Her cycle remains heavy. No other blood loss noted.  No abnormal bruising, no petechiae.  She has chronic mild swelling in the left lower extremity. Pedal pulses are 2+.  She has positional numbness and tingling in her hands. This resolves with changing position.  No falls or syncope reported.  No fever, chills, n/v, cough, rash, dizziness, SOB, chest pain, palpitations, abdominal pain or changes in bowel or bladder habits.  She has had epigastric pain and states that her endoscopy last week showed gastritis. She is taking Pepcid PRN and Protonix BID at this time. This seems to have helped. She notes that spicy foods and ETOH will exacerbate her symptoms.  She has maintained a good appetite and is staying well hydrated at this time.  Weight is stable at 171 lbs.   ECOG Performance Status: 1 - Symptomatic but completely ambulatory  Medications:  Allergies as of 01/03/2022   No Known Allergies      Medication List        Accurate as of January 03, 2022 11:05 AM. If you have any questions, ask your nurse or doctor.          apixaban 2.5 MG Tabs tablet Commonly known as: ELIQUIS Take 2.5 mg by mouth 2 (two) times daily.   azelastine 0.1 % nasal spray Commonly known as: ASTELIN Place 2 sprays into both nostrils 2 (two) times daily as needed.   famotidine 20 MG tablet Commonly known as: Pepcid Take 1 tablet (20 mg total) by mouth 2 (two) times daily.   fluticasone 50 MCG/ACT nasal  spray Commonly known as: FLONASE Place 1 spray into both nostrils daily as needed for rhinitis or allergies.   NICOTINE TD Place onto the skin daily.   ondansetron 4 MG disintegrating tablet Commonly known as: ZOFRAN-ODT Take 1 tablet (4 mg total) by mouth every 8 (eight) hours as needed for nausea or vomiting.   pantoprazole 40 MG tablet Commonly known as: PROTONIX Take 1 tablet (40 mg total) by mouth 2 (two) times daily.   VITAMIN D3 COMPLETE PO Take by mouth as needed.        Allergies: No Known Allergies  Past Medical History, Surgical history, Social history, and Family History were reviewed and updated.  Review of Systems: All other 10 point review of systems is negative.   Physical Exam:  height is 5\' 10"  (1.778 m) and weight is 171 lb 12 oz (77.9 kg). Her oral temperature is 98.2 F (36.8 C). Her blood pressure is 146/91 (abnormal) and her pulse is 83. Her respiration is 18 and oxygen saturation is 100%.   Wt Readings from Last 3 Encounters:  01/03/22 171 lb 12 oz (77.9 kg)  12/29/21 171 lb (77.6 kg)  11/28/21 160 lb (72.6 kg)    Ocular: Sclerae unicteric, pupils equal, round and reactive to light Ear-nose-throat: Oropharynx clear, dentition fair Lymphatic: No cervical or supraclavicular adenopathy Lungs no rales or rhonchi, good excursion bilaterally Heart regular  rate and rhythm, no murmur appreciated Abd soft, nontender, positive bowel sounds MSK no focal spinal tenderness, no joint edema Neuro: non-focal, well-oriented, appropriate affect Breasts: Deferred   Lab Results  Component Value Date   WBC 3.9 (L) 01/03/2022   HGB 13.5 01/03/2022   HCT 41.6 01/03/2022   MCV 84.7 01/03/2022   PLT 238 01/03/2022   Lab Results  Component Value Date   FERRITIN 24 03/04/2008   IRON 29 (L) 03/04/2008   TIBC 440 03/04/2008   UIBC 411 03/04/2008   IRONPCTSAT 7 (L) 03/04/2008   Lab Results  Component Value Date   RBC 4.91 01/03/2022   No results found  for: "KPAFRELGTCHN", "LAMBDASER", "KAPLAMBRATIO" No results found for: "IGGSERUM", "IGA", "IGMSERUM" No results found for: "TOTALPROTELP", "ALBUMINELP", "A1GS", "A2GS", "BETS", "BETA2SER", "GAMS", "MSPIKE", "SPEI"   Chemistry      Component Value Date/Time   NA 133 (L) 07/08/2021 0900   K 3.8 07/08/2021 0900   CL 105 07/08/2021 0900   CO2 23 07/08/2021 0900   BUN 10 07/08/2021 0900   CREATININE 0.77 07/08/2021 0900   CREATININE 0.88 07/05/2021 1457      Component Value Date/Time   CALCIUM 8.2 (L) 07/08/2021 0900   ALKPHOS 51 07/08/2021 0900   AST 17 07/08/2021 0900   AST 22 07/05/2021 1457   ALT 16 07/08/2021 0900   ALT 18 07/05/2021 1457   BILITOT 0.9 07/08/2021 0900   BILITOT 0.9 07/05/2021 1457       Impression and Plan: Monica Holland is a very pleasant 36 yo African American female with history of multiple recurrent thrombotic events now on life anticoagulation with Eliquis. She also has family history of thrombotic events on maternal and paternal sides.  Hyper coag panel was negative.  She will continue her same regimen with maintenance Eliquis.  Follow-up in 6 months.   Eileen Stanford, NP 9/19/202311:05 AM

## 2022-01-03 NOTE — Progress Notes (Signed)
Left message for patient to return call for change in arrival time.

## 2022-01-04 ENCOUNTER — Encounter (HOSPITAL_BASED_OUTPATIENT_CLINIC_OR_DEPARTMENT_OTHER): Payer: Self-pay | Admitting: Obstetrics and Gynecology

## 2022-01-04 ENCOUNTER — Ambulatory Visit (HOSPITAL_BASED_OUTPATIENT_CLINIC_OR_DEPARTMENT_OTHER): Payer: Medicaid Other | Admitting: Anesthesiology

## 2022-01-04 ENCOUNTER — Encounter (HOSPITAL_BASED_OUTPATIENT_CLINIC_OR_DEPARTMENT_OTHER)
Admission: RE | Disposition: A | Discharge status: Home / Self Care | Payer: Self-pay | Source: Ambulatory Visit | Attending: Obstetrics and Gynecology

## 2022-01-04 ENCOUNTER — Other Ambulatory Visit: Payer: Self-pay

## 2022-01-04 ENCOUNTER — Ambulatory Visit (HOSPITAL_BASED_OUTPATIENT_CLINIC_OR_DEPARTMENT_OTHER)
Admission: RE | Admit: 2022-01-04 | Discharge: 2022-01-04 | Disposition: A | Discharge status: Home / Self Care | Payer: Medicaid Other | Source: Ambulatory Visit | Attending: Obstetrics and Gynecology | Admitting: Obstetrics and Gynecology

## 2022-01-04 DIAGNOSIS — N939 Abnormal uterine and vaginal bleeding, unspecified: Secondary | ICD-10-CM | POA: Insufficient documentation

## 2022-01-04 DIAGNOSIS — F17210 Nicotine dependence, cigarettes, uncomplicated: Secondary | ICD-10-CM | POA: Insufficient documentation

## 2022-01-04 DIAGNOSIS — I82402 Acute embolism and thrombosis of unspecified deep veins of left lower extremity: Secondary | ICD-10-CM | POA: Diagnosis present

## 2022-01-04 DIAGNOSIS — Z01818 Encounter for other preprocedural examination: Secondary | ICD-10-CM

## 2022-01-04 DIAGNOSIS — K219 Gastro-esophageal reflux disease without esophagitis: Secondary | ICD-10-CM | POA: Insufficient documentation

## 2022-01-04 DIAGNOSIS — F1290 Cannabis use, unspecified, uncomplicated: Secondary | ICD-10-CM | POA: Diagnosis not present

## 2022-01-04 DIAGNOSIS — Z86718 Personal history of other venous thrombosis and embolism: Secondary | ICD-10-CM | POA: Diagnosis not present

## 2022-01-04 DIAGNOSIS — Z86711 Personal history of pulmonary embolism: Secondary | ICD-10-CM | POA: Insufficient documentation

## 2022-01-04 DIAGNOSIS — Z79899 Other long term (current) drug therapy: Secondary | ICD-10-CM | POA: Diagnosis not present

## 2022-01-04 DIAGNOSIS — Z7901 Long term (current) use of anticoagulants: Secondary | ICD-10-CM | POA: Insufficient documentation

## 2022-01-04 DIAGNOSIS — N879 Dysplasia of cervix uteri, unspecified: Secondary | ICD-10-CM | POA: Diagnosis not present

## 2022-01-04 HISTORY — DX: Nausea: R11.0

## 2022-01-04 HISTORY — DX: Abnormal uterine and vaginal bleeding, unspecified: N93.9

## 2022-01-04 HISTORY — DX: Personal history of other venous thrombosis and embolism: Z86.718

## 2022-01-04 HISTORY — PX: DILITATION & CURRETTAGE/HYSTROSCOPY WITH NOVASURE ABLATION: SHX5568

## 2022-01-04 HISTORY — DX: Venous insufficiency (chronic) (peripheral): I87.2

## 2022-01-04 HISTORY — DX: Presence of spectacles and contact lenses: Z97.3

## 2022-01-04 HISTORY — DX: Long term (current) use of anticoagulants: Z79.01

## 2022-01-04 HISTORY — PX: INTRAUTERINE DEVICE (IUD) INSERTION: SHX5877

## 2022-01-04 HISTORY — DX: Gastro-esophageal reflux disease without esophagitis: K21.9

## 2022-01-04 LAB — CBC
HCT: 38.9 % (ref 36.0–46.0)
Hemoglobin: 12.8 g/dL (ref 12.0–15.0)
MCH: 27.8 pg (ref 26.0–34.0)
MCHC: 32.9 g/dL (ref 30.0–36.0)
MCV: 84.6 fL (ref 80.0–100.0)
Platelets: 230 10*3/uL (ref 150–400)
RBC: 4.6 MIL/uL (ref 3.87–5.11)
RDW: 14.4 % (ref 11.5–15.5)
WBC: 4.3 10*3/uL (ref 4.0–10.5)
nRBC: 0 % (ref 0.0–0.2)

## 2022-01-04 LAB — TYPE AND SCREEN
ABO/RH(D): B POS
Antibody Screen: NEGATIVE

## 2022-01-04 LAB — POCT PREGNANCY, URINE: Preg Test, Ur: NEGATIVE

## 2022-01-04 SURGERY — DILATATION & CURETTAGE/HYSTEROSCOPY WITH NOVASURE ABLATION
Anesthesia: General | Site: Vagina

## 2022-01-04 MED ORDER — LEVONORGESTREL 20 MCG/DAY IU IUD
1.0000 | INTRAUTERINE_SYSTEM | Freq: Once | INTRAUTERINE | Status: AC
  Administered 2022-01-04: 1 via INTRAUTERINE

## 2022-01-04 MED ORDER — PROMETHAZINE HCL 25 MG/ML IJ SOLN: 6.2500 mg | INTRAMUSCULAR | Status: DC | PRN

## 2022-01-04 MED ORDER — AMISULPRIDE (ANTIEMETIC) 5 MG/2ML IV SOLN: 10.0000 mg | Freq: Once | INTRAVENOUS | Status: DC | PRN

## 2022-01-04 MED ORDER — ACETAMINOPHEN 500 MG PO TABS
1000.0000 mg | ORAL_TABLET | ORAL | Status: AC
  Administered 2022-01-04: 1000 mg via ORAL

## 2022-01-04 MED ORDER — KETOROLAC TROMETHAMINE 30 MG/ML IJ SOLN
INTRAMUSCULAR | Status: DC | PRN
  Administered 2022-01-04: 30 mg via INTRAVENOUS

## 2022-01-04 MED ORDER — DEXAMETHASONE SODIUM PHOSPHATE 10 MG/ML IJ SOLN
INTRAMUSCULAR | Status: DC | PRN
  Administered 2022-01-04: 10 mg via INTRAVENOUS

## 2022-01-04 MED ORDER — LIDOCAINE 2% (20 MG/ML) 5 ML SYRINGE
INTRAMUSCULAR | Status: DC | PRN
  Administered 2022-01-04: 40 mg via INTRAVENOUS

## 2022-01-04 MED ORDER — MIDAZOLAM HCL 2 MG/2ML IJ SOLN
INTRAMUSCULAR | Status: AC
  Filled 2022-01-04: qty 2

## 2022-01-04 MED ORDER — ACETAMINOPHEN 10 MG/ML IV SOLN: 1000.0000 mg | Freq: Once | INTRAVENOUS | Status: DC | PRN

## 2022-01-04 MED ORDER — SCOPOLAMINE 1 MG/3DAYS TD PT72: 1.0000 | MEDICATED_PATCH | TRANSDERMAL | Status: DC

## 2022-01-04 MED ORDER — ACETAMINOPHEN 500 MG PO TABS
ORAL_TABLET | ORAL | Status: AC
  Filled 2022-01-04: qty 2

## 2022-01-04 MED ORDER — ACETAMINOPHEN 325 MG PO TABS: 325.0000 mg | ORAL_TABLET | ORAL | Status: DC | PRN

## 2022-01-04 MED ORDER — OXYCODONE HCL 5 MG PO TABS: 5.0000 mg | ORAL_TABLET | ORAL | 0 refills | Status: DC | PRN

## 2022-01-04 MED ORDER — FENTANYL CITRATE (PF) 250 MCG/5ML IJ SOLN
INTRAMUSCULAR | Status: DC | PRN
  Administered 2022-01-04 (×2): 25 ug via INTRAVENOUS
  Administered 2022-01-04: 50 ug via INTRAVENOUS

## 2022-01-04 MED ORDER — IBUPROFEN 800 MG PO TABS: 800.0000 mg | ORAL_TABLET | Freq: Three times a day (TID) | ORAL | 0 refills | Status: DC | PRN

## 2022-01-04 MED ORDER — FENTANYL CITRATE (PF) 100 MCG/2ML IJ SOLN
INTRAMUSCULAR | Status: AC
  Filled 2022-01-04: qty 2

## 2022-01-04 MED ORDER — KETOROLAC TROMETHAMINE 15 MG/ML IJ SOLN: 15.0000 mg | INTRAMUSCULAR | Status: DC

## 2022-01-04 MED ORDER — OXYCODONE HCL 5 MG PO TABS
ORAL_TABLET | ORAL | Status: AC
  Filled 2022-01-04: qty 1

## 2022-01-04 MED ORDER — ONDANSETRON HCL 4 MG/2ML IJ SOLN
INTRAMUSCULAR | Status: DC | PRN
  Administered 2022-01-04: 4 mg via INTRAVENOUS

## 2022-01-04 MED ORDER — OXYCODONE HCL 5 MG/5ML PO SOLN: 5.0000 mg | Freq: Once | ORAL | Status: AC | PRN

## 2022-01-04 MED ORDER — LACTATED RINGERS IV SOLN: INTRAVENOUS | Status: DC

## 2022-01-04 MED ORDER — POVIDONE-IODINE 10 % EX SWAB: 2.0000 | Freq: Once | CUTANEOUS | Status: DC

## 2022-01-04 MED ORDER — OXYCODONE HCL 5 MG PO TABS
5.0000 mg | ORAL_TABLET | Freq: Once | ORAL | Status: AC | PRN
  Administered 2022-01-04: 5 mg via ORAL

## 2022-01-04 MED ORDER — SUGAMMADEX SODIUM 500 MG/5ML IV SOLN
INTRAVENOUS | Status: AC
  Filled 2022-01-04: qty 5

## 2022-01-04 MED ORDER — ACETAMINOPHEN 160 MG/5ML PO SOLN: 325.0000 mg | ORAL | Status: DC | PRN

## 2022-01-04 MED ORDER — LEVONORGESTREL 20 MCG/DAY IU IUD
INTRAUTERINE_SYSTEM | INTRAUTERINE | Status: AC
  Filled 2022-01-04: qty 1

## 2022-01-04 MED ORDER — MIDAZOLAM HCL 2 MG/2ML IJ SOLN
INTRAMUSCULAR | Status: DC | PRN
  Administered 2022-01-04: 2 mg via INTRAVENOUS

## 2022-01-04 MED ORDER — FENTANYL CITRATE (PF) 100 MCG/2ML IJ SOLN
25.0000 ug | INTRAMUSCULAR | Status: DC | PRN
  Administered 2022-01-04 (×2): 25 ug via INTRAVENOUS

## 2022-01-04 MED ORDER — PROPOFOL 10 MG/ML IV BOLUS
INTRAVENOUS | Status: DC | PRN
  Administered 2022-01-04: 200 mg via INTRAVENOUS

## 2022-01-04 MED ORDER — LIDOCAINE-EPINEPHRINE 1 %-1:100000 IJ SOLN
INTRAMUSCULAR | Status: AC
  Filled 2022-01-04: qty 1

## 2022-01-04 MED ORDER — DEXMEDETOMIDINE HCL IN NACL 80 MCG/20ML IV SOLN
INTRAVENOUS | Status: DC | PRN
  Administered 2022-01-04: 8 ug via BUCCAL

## 2022-01-04 SURGICAL SUPPLY — 14 items
ABLATOR SURESOUND NOVASURE (ABLATOR) ×2 IMPLANT
GLOVE BIO SURGEON STRL SZ 6 (GLOVE) ×2 IMPLANT
GLOVE BIOGEL PI IND STRL 7.5 (GLOVE) IMPLANT
GLOVE SURG SS PI 7.5 STRL IVOR (GLOVE) IMPLANT
GOWN STRL REUS W/ TWL LRG LVL3 (GOWN DISPOSABLE) ×4 IMPLANT
GOWN STRL REUS W/TWL LRG LVL3 (GOWN DISPOSABLE) ×4 IMPLANT
IV NS IRRIG 3000ML ARTHROMATIC (IV SOLUTION) IMPLANT
KIT PROCEDURE FLUENT (KITS) ×2 IMPLANT
Mirena IUD IMPLANT
PACK VAGINAL MINOR WOMEN LF (CUSTOM PROCEDURE TRAY) ×2 IMPLANT
PAD OB MATERNITY 4.3X12.25 (PERSONAL CARE ITEMS) ×2 IMPLANT
SEAL ROD LENS SCOPE MYOSURE (ABLATOR) IMPLANT
SOL PREP POV-IOD 4OZ 10% (MISCELLANEOUS) IMPLANT
TOWEL GREEN STERILE FF (TOWEL DISPOSABLE) ×4 IMPLANT

## 2022-01-04 NOTE — Discharge Instructions (Signed)
No acetaminophen/Tylenol until after 6:15pm today if needed for pain.   No ibuprofen, Advil, Aleve, Motrin, ketorolac, meloxicam, naproxen, or other NSAIDS until after 8:00pm today if needed for pain.    Post Anesthesia Home Care Instructions  Activity: Get plenty of rest for the remainder of the day. A responsible individual must stay with you for 24 hours following the procedure.  For the next 24 hours, DO NOT: -Drive a car -Paediatric nurse -Drink alcoholic beverages -Take any medication unless instructed by your physician -Make any legal decisions or sign important papers.  Meals: Start with liquid foods such as gelatin or soup. Progress to regular foods as tolerated. Avoid greasy, spicy, heavy foods. If nausea and/or vomiting occur, drink only clear liquids until the nausea and/or vomiting subsides. Call your physician if vomiting continues.  Special Instructions/Symptoms: Your throat may feel dry or sore from the anesthesia or the breathing tube placed in your throat during surgery. If this causes discomfort, gargle with warm salt water. The discomfort should disappear within 24 hours.

## 2022-01-04 NOTE — Transfer of Care (Signed)
Immediate Anesthesia Transfer of Care Note  Patient: Monica Holland  Procedure(s) Performed: DILATATION & CURETTAGE/HYSTEROSCOPY WITH NOVASURE ABLATION (Vagina ) INTRAUTERINE DEVICE (IUD) INSERTION (Uterus)  Patient Location: PACU  Anesthesia Type:General  Level of Consciousness: awake, alert  and oriented  Airway & Oxygen Therapy: Patient Spontanous Breathing  Post-op Assessment: Report given to RN and Post -op Vital signs reviewed and stable  Post vital signs: Reviewed and stable  Last Vitals:  Vitals Value Taken Time  BP 138/98 01/04/22 1412  Temp    Pulse 76 01/04/22 1413  Resp 21 01/04/22 1413  SpO2 96 % 01/04/22 1413  Vitals shown include unvalidated device data.  Last Pain: There were no vitals filed for this visit.       Complications: No notable events documented.

## 2022-01-04 NOTE — Anesthesia Preprocedure Evaluation (Signed)
Anesthesia Evaluation  Patient identified by MRN, date of birth, ID band Patient awake    Reviewed: Allergy & Precautions, NPO status , Patient's Chart, lab work & pertinent test results  Airway Mallampati: I  TM Distance: >3 FB Neck ROM: Full    Dental  (+) Teeth Intact, Dental Advisory Given   Pulmonary Current Smoker and Patient abstained from smoking.,    breath sounds clear to auscultation       Cardiovascular negative cardio ROS   Rhythm:Regular Rate:Normal     Neuro/Psych negative neurological ROS  negative psych ROS   GI/Hepatic Neg liver ROS, GERD  ,  Endo/Other  negative endocrine ROS  Renal/GU negative Renal ROS     Musculoskeletal negative musculoskeletal ROS (+)   Abdominal   Peds  Hematology negative hematology ROS (+)   Anesthesia Other Findings   Reproductive/Obstetrics                             Anesthesia Physical Anesthesia Plan  ASA: 2  Anesthesia Plan: General   Post-op Pain Management:    Induction: Intravenous  PONV Risk Score and Plan: 3 and Ondansetron, Dexamethasone and Midazolam  Airway Management Planned: LMA  Additional Equipment: None  Intra-op Plan:   Post-operative Plan: Extubation in OR  Informed Consent: I have reviewed the patients History and Physical, chart, labs and discussed the procedure including the risks, benefits and alternatives for the proposed anesthesia with the patient or authorized representative who has indicated his/her understanding and acceptance.     Dental advisory given  Plan Discussed with: CRNA  Anesthesia Plan Comments:         Anesthesia Quick Evaluation

## 2022-01-04 NOTE — Anesthesia Procedure Notes (Signed)
Procedure Name: LMA Insertion Date/Time: 01/04/2022 1:33 PM  Performed by: Clearnce Sorrel, CRNAPre-anesthesia Checklist: Patient identified, Emergency Drugs available, Suction available and Patient being monitored Patient Re-evaluated:Patient Re-evaluated prior to induction Oxygen Delivery Method: Circle System Utilized Preoxygenation: Pre-oxygenation with 100% oxygen Induction Type: IV induction Ventilation: Mask ventilation without difficulty LMA: LMA inserted LMA Size: 4.0 Number of attempts: 1 Airway Equipment and Method: Bite block Placement Confirmation: positive ETCO2 Tube secured with: Tape Dental Injury: Teeth and Oropharynx as per pre-operative assessment

## 2022-01-04 NOTE — Op Note (Signed)
01/04/22 2:03 PM  Preoperative Diagnosis: Heavy vaginal bleeding on eliquis for h/o recurrent dvt Postoperative Diagnosis: Same Procedure: D&C, hysteroscopy, novasure endometrial ablation, and insertion of IUD  Surgeon: Dr. Damita Dunnings Assistant: None EBL 5 cc IVF: 500 cc  UOP: Not drained  Specimens: ECC and EMC  Findings: NEFG, normal ostia visualized bilaterally, normal appearing proliferative endometrial lining. Uterus in the retroverted position. Cervix was normal in appearance but almost flush to the vagina with some pulling toward the right side - could reflect some intra-abdominal scar tissue.   Description of the procedure: Preop antibiotics not indicated. Informed consent reviewed and signed. Pt given opportunity to ask questions.   Pt prepped and draped in the dorsal lithotomy fashion after LMA anesthesia found to be adequate. Timeout performed.   Open sided speculum placed into the patient's vagina. Single tooth tenaculum applied to the 12 o'clock position of the cervix. Cervix dilated to 19 Pratt. 30 degree hysteroscope was inserted into the cavity with the aforementioned findings noted. ECC and EMC performed. Uterus sounded to 7.  Cervix measured to 3 cm giving a cavity length of 4 cm.  Novasure device inserted and cavity width of 3.4 obtained. Cavity test done and normal. Device activated for a power of 75w for 2:00 minutes. Device removed and cavity inspected again with hysteroscope and homogenous char noted. IUD then inserted as per manufacturer instructions and strings trimmed 3 cm below the cervix.   All instruments removed. Hemostatic at the end of the procedure. Procedure completed.  Counts correct x2.  Pt taken to recovery room in stable condition.  Radene Gunning, MD Attending [place], [facility], [place]

## 2022-01-04 NOTE — H&P (Signed)
Faculty Practice Obstetrics and Gynecology Attending History and Physical  Monica Holland is a 36 y.o. G4P4000 here today for [facility], hysteroscopy, Novasure ablation and LNG-IUD insertion.    01/2021 [place] was wnl. No anatomic reason for bleeding.  10/2021: EMB negative 10/2021: pap wnl, HPV neg   She is seeking surgery for irregular, heavy and prolonged bleeding on life-long eliquis due to history of recurrent DVT.   Past Medical History:  Diagnosis Date   Abnormal uterine bleeding (AUB)    heavy vaginal bleeding   Anticoagulant long-term use    eliquis--- managed by dr Myna Hidalgo (hematologist)   Chronic nausea    per pt intermittant , without vomiting,  had normal EGD 12-29-2021 by dr [name]   Chronic venous insufficiency of lower extremity    evaluted by dr Heath Lark (vascular) 03-15-2021 note in epic, chronic venous insuff LLE causing edema/ post thombotic syndrome   GERD (gastroesophageal reflux disease)    History of pulmonary embolus (PE) 2014   nonocclusive and RLE DVT   History of recurrent deep vein thrombosis (DVT)    LLE in 2004, 2009, 2016, 2021  & 2022 chronic DVT thrombis /  in 2014 , RLE and PE;   followed by dr Myna Hidalgo (hematology)  negative work-up for blood disorder ,  secondary to  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   Wears glasses    Past Surgical History:  Procedure Laterality Date   ANKLE SURGERY Right 2014   ESOPHAGOGASTRODUODENOSCOPY (EGD) WITH PROPOFOL  12/29/2021   by dr [name]   OB History  Gravida Para Term Preterm AB Living  4 4 4  0 0 0  SAB IAB Ectopic Multiple Live Births  0 0 0 0 0    # Outcome Date GA Lbr Len/2nd Weight Sex Delivery Anes PTL Lv  4 Term 2009     Vag-Spont     3 Term 2004     Vag-Spont     2 Term 2002     Vag-Spont     1 Term 1999     Vag-Spont     Patient denies any other pertinent gynecologic issues.  No current facility-administered medications on file prior to encounter.   Current Outpatient  Medications on File Prior to Encounter  Medication Sig Dispense Refill   apixaban (ELIQUIS) 2.5 MG TABS tablet Take 2.5 mg by mouth 2 (two) times daily.     Multiple Vitamins-Minerals (VITAMIN D3 COMPLETE PO) Take by mouth as needed.     NICOTINE TD Place onto the skin daily.     ondansetron (ZOFRAN-ODT) 4 MG disintegrating tablet Take 1 tablet (4 mg total) by mouth every 8 (eight) hours as needed for nausea or vomiting. 30 tablet 1   pantoprazole (PROTONIX) 40 MG tablet Take 1 tablet (40 mg total) by mouth 2 (two) times daily. (Patient taking differently: Take 40 mg by mouth 2 (two) times daily.) 60 tablet 5   azelastine (ASTELIN) 0.1 % nasal spray Place 2 sprays into both nostrils 2 (two) times daily as needed.     fluticasone (FLONASE) 50 MCG/ACT nasal spray Place 1 spray into both nostrils daily as needed for rhinitis or allergies.     [DISCONTINUED] Rivaroxaban 15 & 20 MG TBPK Take as directed on package: Start with one 15mg  tablet by mouth twice a day with food. On Day 22, switch to one 20mg  tablet once a day with food. (Patient not taking: Reported on 04/02/2020) 51 each 0   No Known  Allergies  Social History:   reports that she has been smoking cigarettes. She has never used smokeless tobacco. She reports current alcohol use of about 3.0 standard drinks of alcohol per week. She reports current drug use. Drug: Marijuana. Family History  Problem Relation Age of Onset   Deep vein thrombosis Mother    Hyperlipidemia Mother    Diabetes Mother    High Cholesterol Mother    Stomach cancer Neg Hx    Colon cancer Neg Hx    Esophageal cancer Neg Hx    Rectal cancer Neg Hx     Review of Systems: Pertinent items noted in HPI and remainder of comprehensive ROS otherwise negative.  PHYSICAL EXAM: Blood pressure (!) 127/97, pulse 82, temperature 98.2 F (36.8 C), resp. rate 17, height 5\' 11"  (1.803 m), weight 77.2 kg, last menstrual period 12/30/2021, SpO2 98 %. CONSTITUTIONAL:  Well-developed, well-nourished female in no acute distress.  HENT:  Normocephalic, atraumatic, External right and left ear normal. Oropharynx is clear and moist EYES: Conjunctivae and EOM are normal. Pupils are equal, round, and reactive to light. No scleral icterus.  NECK: Normal range of motion, supple, no masses SKIN: Skin is warm and dry. No rash noted. Not diaphoretic. No erythema. No pallor. NEUROLOGIC: Alert and oriented to person, place, and time. Normal reflexes, muscle tone coordination. No cranial nerve deficit noted. PSYCHIATRIC: Normal mood and affect. Normal behavior. Normal judgment and thought content. CARDIOVASCULAR: Normal heart rate noted, regular rhythm RESPIRATORY: Effort and breath sounds normal, no problems with respiration noted ABDOMEN: Soft, nontender, nondistended. PELVIC: not examined  MUSCULOSKELETAL: Normal range of motion. No tenderness.  No cyanosis, clubbing, or edema.  2+ distal pulses.  Labs: Results for orders placed or performed in visit on 01/03/22 (from the past 336 hour(s))  Lactate dehydrogenase (LDH)   Collection Time: 01/03/22 10:53 AM  Result Value Ref Range   LDH 126 98 - 192 U/L  CMP ([place] only)   Collection Time: 01/03/22 10:53 AM  Result Value Ref Range   Sodium 144 135 - 145 mmol/L   Potassium 4.4 3.5 - 5.1 mmol/L   Chloride 107 98 - 111 mmol/L   CO2 30 22 - 32 mmol/L   Glucose, Bld 102 (H) 70 - 99 mg/dL   BUN 7 6 - 20 mg/dL   Creatinine 0.93 0.44 - 1.00 mg/dL   Calcium 9.4 8.9 - 10.3 mg/dL   Total Protein 7.3 6.5 - 8.1 g/dL   Albumin 4.5 3.5 - 5.0 g/dL   AST 14 (L) 15 - 41 U/L   ALT 12 0 - 44 U/L   Alkaline Phosphatase 46 38 - 126 U/L   Total Bilirubin 0.3 0.3 - 1.2 mg/dL   GFR, Estimated >60 >60 mL/min   Anion gap 7 5 - 15  CBC with Differential ([facility] Only)   Collection Time: 01/03/22 10:53 AM  Result Value Ref Range   WBC Count 3.9 (L) 4.0 - 10.5 K/uL   RBC 4.91 3.87 - 5.11 MIL/uL   Hemoglobin 13.5 12.0 -  15.0 g/dL   HCT 41.6 36.0 - 46.0 %   MCV 84.7 80.0 - 100.0 fL   MCH 27.5 26.0 - 34.0 pg   MCHC 32.5 30.0 - 36.0 g/dL   RDW 14.3 11.5 - 15.5 %   Platelet Count 238 150 - 400 K/uL   nRBC 0.0 0.0 - 0.2 %   Neutrophils Relative % 36 %   Neutro Abs 1.4 (L) 1.7 - 7.7 K/uL  Lymphocytes Relative 52 %   Lymphs Abs 2.1 0.7 - 4.0 K/uL   Monocytes Relative 8 %   Monocytes Absolute 0.3 0.1 - 1.0 K/uL   Eosinophils Relative 3 %   Eosinophils Absolute 0.1 0.0 - 0.5 K/uL   Basophils Relative 1 %   Basophils Absolute 0.0 0.0 - 0.1 K/uL   Immature Granulocytes 0 %   Abs Immature Granulocytes 0.01 0.00 - 0.07 K/uL    Imaging Studies: No results found.  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 She would like and IUD insertion following an endometrial ablation. Full procedure is: D&C, hysteroscopy, Novasure endometrial ablation and insertion of IUD.  - Risks of surgery include but are not limited to: bleeding, infection, injury to surrounding organs/tissues (i.e. bowel/bladder/ureters), need for additional procedures, wound complications, hospital re-admission, and conversion to open surgery, VTE, additional complications: Uterine perforation - We discussed postop restrictions, precautions and expectations - All questions answered. Consent reviewed and signed.   Milas Hock, MD, FACOG Obstetrician & Gynecologist, [facility] for [facility], [facility]

## 2022-01-05 ENCOUNTER — Encounter: Payer: Self-pay | Admitting: Obstetrics and Gynecology

## 2022-01-05 ENCOUNTER — Encounter (HOSPITAL_BASED_OUTPATIENT_CLINIC_OR_DEPARTMENT_OTHER): Payer: Self-pay | Admitting: Obstetrics and Gynecology

## 2022-01-05 DIAGNOSIS — G8918 Other acute postprocedural pain: Secondary | ICD-10-CM

## 2022-01-05 DIAGNOSIS — N939 Abnormal uterine and vaginal bleeding, unspecified: Secondary | ICD-10-CM

## 2022-01-05 LAB — SURGICAL PATHOLOGY

## 2022-01-05 MED ORDER — OXYCODONE HCL 5 MG PO TABS: 5.0000 mg | ORAL_TABLET | ORAL | 0 refills | Status: AC | PRN

## 2022-01-05 NOTE — Telephone Encounter (Signed)
Spoke with Athleen. She called back. She cannot take Ibuprofen. Taking tylenol. Will give a few more oxycodone to help with the pain which is improved today but sleep is the hardest part. Minimal bleeding.   Reviewed surgery and pathology.   Reviewed if this doesn't work she will have done everything to avoid hysterectomy and in that event we discussed best would be TLH due to some of my concern at the time of her D&C how her cervix pulls somewhat to the right. Hopefully however, we will avoid hysterectomy with these measures.   Work note submitted for her.   Radene Gunning, MD Attending [place], [facility] for [facility], [place]

## 2022-01-06 ENCOUNTER — Ambulatory Visit: Payer: Medicaid Other | Admitting: Family

## 2022-01-06 ENCOUNTER — Other Ambulatory Visit: Payer: Medicaid Other

## 2022-01-06 NOTE — Anesthesia Postprocedure Evaluation (Signed)
Anesthesia Post Note  Patient: Jamayah J Tirpak  Procedure(s) Performed: DILATATION & CURETTAGE/HYSTEROSCOPY WITH NOVASURE ABLATION (Vagina ) INTRAUTERINE DEVICE (IUD) INSERTION (Uterus)     Patient location during evaluation: PACU Anesthesia Type: General Level of consciousness: awake and alert Pain management: pain level controlled Vital Signs Assessment: post-procedure vital signs reviewed and stable Respiratory status: spontaneous breathing, nonlabored ventilation, respiratory function stable and patient connected to nasal cannula oxygen Cardiovascular status: blood pressure returned to baseline and stable Postop Assessment: no apparent nausea or vomiting Anesthetic complications: no   No notable events documented.  Last Vitals:  Vitals:   01/04/22 1445 01/04/22 1528  BP: (!) 131/95 (!) 126/95  Pulse: 61 (!) 54  Resp: 18 16  Temp: 36.7 C 36.5 C  SpO2: 93% 100%                 Effie Berkshire

## 2022-01-09 ENCOUNTER — Encounter: Payer: Self-pay | Admitting: *Deleted

## 2022-01-16 ENCOUNTER — Emergency Department (HOSPITAL_BASED_OUTPATIENT_CLINIC_OR_DEPARTMENT_OTHER)
Admission: EM | Admit: 2022-01-16 | Discharge: 2022-01-16 | Disposition: A | Discharge status: Home / Self Care | Payer: Medicaid Other | Attending: Emergency Medicine | Admitting: Emergency Medicine

## 2022-01-16 ENCOUNTER — Other Ambulatory Visit: Payer: Self-pay

## 2022-01-16 ENCOUNTER — Emergency Department (HOSPITAL_BASED_OUTPATIENT_CLINIC_OR_DEPARTMENT_OTHER): Payer: Medicaid Other

## 2022-01-16 ENCOUNTER — Telehealth: Payer: Self-pay

## 2022-01-16 DIAGNOSIS — F17210 Nicotine dependence, cigarettes, uncomplicated: Secondary | ICD-10-CM | POA: Diagnosis not present

## 2022-01-16 DIAGNOSIS — G8918 Other acute postprocedural pain: Secondary | ICD-10-CM | POA: Diagnosis not present

## 2022-01-16 DIAGNOSIS — Z7901 Long term (current) use of anticoagulants: Secondary | ICD-10-CM | POA: Insufficient documentation

## 2022-01-16 DIAGNOSIS — R1030 Lower abdominal pain, unspecified: Secondary | ICD-10-CM | POA: Diagnosis present

## 2022-01-16 LAB — COMPREHENSIVE METABOLIC PANEL
ALT: 9 U/L (ref 0–44)
AST: 13 U/L — ABNORMAL LOW (ref 15–41)
Albumin: 4.5 g/dL (ref 3.5–5.0)
Alkaline Phosphatase: 43 U/L (ref 38–126)
Anion gap: 10 (ref 5–15)
BUN: 7 mg/dL (ref 6–20)
CO2: 25 mmol/L (ref 22–32)
Calcium: 9.3 mg/dL (ref 8.9–10.3)
Chloride: 106 mmol/L (ref 98–111)
Creatinine, Ser: 0.86 mg/dL (ref 0.44–1.00)
GFR, Estimated: >60 mL/min (ref >60)
Glucose, Bld: 90 mg/dL (ref 70–99)
Potassium: 4 mmol/L (ref 3.5–5.1)
Sodium: 141 mmol/L (ref 135–145)
Total Bilirubin: 0.6 mg/dL (ref 0.3–1.2)
Total Protein: 7.1 g/dL (ref 6.5–8.1)

## 2022-01-16 LAB — CBC
HCT: 40.1 % (ref 36.0–46.0)
Hemoglobin: 13.3 g/dL (ref 12.0–15.0)
MCH: 27.7 pg (ref 26.0–34.0)
MCHC: 33.2 g/dL (ref 30.0–36.0)
MCV: 83.5 fL (ref 80.0–100.0)
Platelets: 196 10*3/uL (ref 150–400)
RBC: 4.8 MIL/uL (ref 3.87–5.11)
RDW: 15 % (ref 11.5–15.5)
WBC: 4.2 10*3/uL (ref 4.0–10.5)
nRBC: 0 % (ref 0.0–0.2)

## 2022-01-16 LAB — LIPASE, BLOOD: Lipase: 33 U/L (ref 11–51)

## 2022-01-16 LAB — URINALYSIS, ROUTINE W REFLEX MICROSCOPIC
Bilirubin Urine: NEGATIVE
Glucose, UA: NEGATIVE mg/dL
Ketones, ur: NEGATIVE mg/dL
Nitrite: NEGATIVE
Specific Gravity, Urine: 1.024 (ref 1.005–1.030)
pH: 7 (ref 5.0–8.0)

## 2022-01-16 LAB — PREGNANCY, URINE: Preg Test, Ur: NEGATIVE

## 2022-01-16 MED ORDER — KETOROLAC TROMETHAMINE 15 MG/ML IJ SOLN
15.0000 mg | Freq: Once | INTRAMUSCULAR | Status: AC
  Administered 2022-01-16: 15 mg via INTRAMUSCULAR
  Filled 2022-01-16: qty 1

## 2022-01-16 MED ORDER — HYDROCODONE-ACETAMINOPHEN 5-325 MG PO TABS: 1.0000 | ORAL_TABLET | ORAL | 0 refills | Status: DC | PRN

## 2022-01-16 NOTE — Telephone Encounter (Signed)
PA for oxycodone received via fax. PA submitted via [email or web address]. Response received stating this does not require PA. Called pharmacy who states this was resolved and patient picked up med on 01/07/22.

## 2022-01-16 NOTE — ED Notes (Signed)
ED Provider at bedside. 

## 2022-01-16 NOTE — ED Triage Notes (Signed)
Pt reports cervical ablation x 3 weeks ago, feel like over did it with heavy lifting, pt now c/o abdominal x 1 day.

## 2022-01-16 NOTE — Discharge Instructions (Signed)
We evaluated you for your pelvic pain.  Your ultrasound showed normal postoperative findings.  We discussed the results with the gynecologist, who felt that you did not need antibiotics.  Please call Dr. Damita Dunnings to follow-up.  We have prescribed you a small amount of additional pain control.  Please do not drink alcohol with this medication.  Please do not drive or operate machinery with this medication.  Please return to the emergency department if you have worsening pain, vaginal discharge, fevers, vomiting, or any other concerning symptoms.

## 2022-01-16 NOTE — ED Notes (Signed)
Patient transported to Ultrasound 

## 2022-01-16 NOTE — ED Provider Notes (Signed)
[facility] EMERGENCY DEPT Provider Note  CSN: [id number] Arrival date & time: 01/16/22 1333  Chief Complaint(s) Abdominal Pain  HPI Monica Holland is a 36 y.o. female with history of heavy vaginal bleeding status post recent D&C, hysteroscopy with IUD placement, chronic DVT on Eliquis presenting with lower abdominal pain.  Patient reports lower abdominal/pelvic pain.  Denies any vaginal bleeding or discharge post procedure.  Denies fevers or chills, dysuria.  She denies nausea or vomiting.  Has been taking Tylenol for her symptoms without significant improvement.  She contacted her OB office, was advised to come to the emergency department for further evaluation.   Past Medical History Past Medical History:  Diagnosis Date   Abnormal uterine bleeding (AUB)    heavy vaginal bleeding   Anticoagulant long-term use    eliquis--- managed by dr Marin Olp (hematologist)   Chronic nausea    per pt intermittant , without vomiting,  had normal EGD 12-29-2021 by dr Lorenso Courier   Chronic venous insufficiency of lower extremity    evaluted by dr Jamelle Haring (vascular) 03-15-2021 note in epic, chronic venous insuff LLE causing edema/ post thombotic syndrome   GERD (gastroesophageal reflux disease)    History of pulmonary embolus (PE) 2014   nonocclusive and RLE DVT   History of recurrent deep vein thrombosis (DVT)    LLE in 2004, 2009, 2016, 2021  & 2022 chronic DVT thrombis /  in 2014 , RLE and PE;   followed by dr Marin Olp (hematology)  negative work-up for blood disorder ,  secondary to  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 Authorizing Provider  HYDROcodone-acetaminophen (NORCO/VICODIN) 5-325 MG tablet Take 1 tablet by mouth every 4 (four) hours as needed. 01/16/22  Yes Cristie Hem, MD  apixaban (ELIQUIS) 2.5 MG TABS tablet Take 2.5 mg by mouth 2 (two) times daily. 06/27/21   [provider]  azelastine (ASTELIN) 0.1 % nasal spray Place 2 sprays into both nostrils 2 (two) times daily as needed. 08/16/21   [provider]  famotidine (PEPCID) 20 MG tablet Take 1 tablet (20 mg total) by mouth 2 (two) times daily. Patient taking differently: Take 20 mg by mouth 2 (two) times daily. 12/29/21   Sharyn Creamer, MD  fluticasone (FLONASE) 50 MCG/ACT nasal spray Place 1 spray into both nostrils daily as needed for rhinitis or allergies. 08/16/21   [provider]  Multiple Vitamins-Minerals (VITAMIN D3 COMPLETE PO) Take by mouth as needed.    [provider]  NICOTINE TD Place onto the skin daily.    [provider]  ondansetron (ZOFRAN-ODT) 4 MG disintegrating tablet Take 1 tablet (4 mg total) by mouth every 8 (eight) hours as needed for nausea or vomiting. 11/15/21   Levin Erp, PA  pantoprazole (PROTONIX) 40 MG tablet Take 1 tablet (40 mg total) by mouth 2 (two) times daily. Patient taking differently: Take 40 mg by mouth 2 (two) times daily. 11/15/21   Levin Erp, PA  Rivaroxaban 15 & 20 MG TBPK Take as directed on package: Start with one  15mg  tablet by mouth twice a day with food. On Day 22, switch to one 20mg  tablet once a day with food. Patient not taking: Reported on 04/02/2020 03/05/13 06/25/20  Varney Biles, MD                                                                                                                                    Past Surgical History Past Surgical History:  Procedure Laterality Date   ANKLE SURGERY Right 2014   DILITATION & CURRETTAGE/HYSTROSCOPY WITH  NOVASURE ABLATION N/A 01/04/2022   Procedure: DILATATION & CURETTAGE/HYSTEROSCOPY WITH NOVASURE ABLATION;  Surgeon: Radene Gunning, MD;  Location: [facility];  Service: Gynecology;  Laterality: N/A;   ESOPHAGOGASTRODUODENOSCOPY (EGD) WITH PROPOFOL  12/29/2021   by dr Lorenso Courier   INTRAUTERINE DEVICE (IUD) INSERTION N/A 01/04/2022   Procedure: INTRAUTERINE DEVICE (IUD) INSERTION;  Surgeon: Radene Gunning, MD;  Location: [facility];  Service: Gynecology;  Laterality: N/A;   Family History Family History  Problem Relation Age of Onset   Deep vein thrombosis Mother    Hyperlipidemia Mother    Diabetes Mother    High Cholesterol Mother    Stomach cancer Neg Hx    Colon cancer Neg Hx    Esophageal cancer Neg Hx    Rectal cancer Neg Hx     Social History Social History   Tobacco Use   Smoking status: Every Day    Types: Cigarettes   Smokeless tobacco: Never   Tobacco comments:    12-30-2021  per pt average 1-2 cig per day down from 1/ 2ppd  Vaping Use   Vaping Use: Never used  Substance Use Topics   Alcohol use: Yes    Alcohol/week: 3.0 standard drinks of alcohol    Types: 3 Cans of beer per week   Drug use: Yes    Types: Marijuana    Comment: 01/03/2022   Allergies Patient has no known allergies.  Review of Systems Review of Systems  All other systems reviewed and are negative.   Physical Exam Vital Signs  I have reviewed the triage vital signs BP 118/88   Pulse 73   Temp 98.2 F (36.8 C) (Oral)   Resp 18   Ht 5\' 11"  (1.803 m)   Wt 77.1 kg   LMP 12/30/2021 (Exact Date)   SpO2 100%   BMI 23.71 kg/m  Physical Exam Vitals and nursing note reviewed.  Constitutional:      General: She is not in acute distress.    Appearance: She is well-developed.  HENT:     Head: Normocephalic and atraumatic.     Mouth/Throat:     Mouth: Mucous membranes are moist.  Eyes:     Pupils: Pupils are equal, round, and reactive to light.  Cardiovascular:      Rate and Rhythm: Normal rate and regular rhythm.     Heart sounds: No murmur heard. Pulmonary:  Effort: Pulmonary effort is normal. No respiratory distress.     Breath sounds: Normal breath sounds.  Abdominal:     General: Abdomen is flat.     Palpations: Abdomen is soft.     Tenderness: There is abdominal tenderness in the suprapubic area.  Musculoskeletal:        General: No tenderness.     Right lower leg: No edema.     Left lower leg: No edema.  Skin:    General: Skin is warm and dry.  Neurological:     General: No focal deficit present.     Mental Status: She is alert. Mental status is at baseline.  Psychiatric:        Mood and Affect: Mood normal.        Behavior: Behavior normal.     ED Results and Treatments Labs (all labs ordered are listed, but only abnormal results are displayed) Labs Reviewed  COMPREHENSIVE METABOLIC PANEL - Abnormal; Notable for the following components:      Result Value   AST 13 (*)    All other components within normal limits  URINALYSIS, ROUTINE W REFLEX MICROSCOPIC - Abnormal; Notable for the following components:   Hgb urine dipstick LARGE (*)    Protein, ur TRACE (*)    Leukocytes,Ua SMALL (*)    All other components within normal limits  LIPASE, BLOOD  CBC  PREGNANCY, URINE                                                                                                                          Radiology US PELVIC COMPLETE WITH TRANSVAGINAL  Result Date: 01/16/2022 CLINICAL DATA:  Midline pelvic pain EXAM: TRANSABDOMINAL AND TRANSVAGINAL ULTRASOUND OF PELVIS TECHNIQUE: Both transabdominal and transvaginal ultrasound examinations of the pelvis were performed. Transabdominal technique was performed for global imaging of the pelvis including uterus, ovaries, adnexal regions, and pelvic cul-de-sac. It was necessary to proceed with endovaginal exam following the transabdominal exam to visualize the endometrium, ovaries, and adnexa.  COMPARISON:  Multiple exams, including 02/11/2021 and CT pelvis 07/08/2021 FINDINGS: Uterus Measurements: 8.9 by 5.6 by 5.5 cm = volume: 143 mL. No fibroids or other mass visualized. Incidental cervical nabothian cysts noted. Retroverted uterus. Endometrium Thickness: Indistinct estimated at 15 mm. There is a small amount of fluid along the endometrium as on image 16 of series 1 -1, along with echogenic foci with associated shadowing as on image 16 of series 1 -1. These could be calcifications or gas bubbles. Right ovary Measurements: 2.6 by 1.7 by 2.2 cm = volume: 5.1 mL. Normal appearance/no adnexal mass. Left ovary Measurements: 3.7 by 1.9 by 1.7 cm = volume: 6.3 mL. Normal appearance/no adnexal mass. Other findings No abnormal free fluid. IMPRESSION: 1. Borderline thickened endometrium, with some irregularity and internal echogenic foci which could be calcifications or gas bubbles. Cannot exclude endometritis. 2. No free pelvic fluid.  The ovaries appear normal. Electronically Signed   By: Cindra Eves.D.  On: 01/16/2022 18:16    Pertinent labs & imaging results that were available during my care of the patient were reviewed by me and considered in my medical decision making (see MDM for details).  Medications Ordered in ED Medications  ketorolac (TORADOL) 15 MG/ML injection 15 mg (15 mg Intramuscular Given 01/16/22 1727)                                                                                                                                     Procedures Procedures  (including critical care time)  Medical Decision Making / ED Course   MDM:  36 year old female presenting with lower abdominal pain after procedure.  We will obtain ultrasound to evaluate for complication of recent procedure, such as endometritis, IUD misplacement.  Also obtain urinalysis to evaluate for urinary tract infection although patient has no dysuria.  No vaginal bleeding or discharge to suggest STI or  cervical injury.  Will treat symptoms, reassess.  Clinical Course as of 01/16/22 1936  Mon Jan 16, 2022  1847 Dr.  Martin Majestic  1849 Dr. Harolyn Rutherford of [facility] reviewed results, reports this is likely just post operative changes. Will discharge patient and recommend follow up with Dr. Damita Dunnings. Will discharge patient to home. All questions answered. Patient comfortable with plan of discharge. Return precautions discussed with patient and specified on the after visit summary.  [WS]    Clinical Course User Index [WS] Cristie Hem, MD     Additional history obtained: -Additional history obtained from spouse -External records from outside source obtained and reviewed including: Chart review including previous notes, labs, imaging, consultation notes   Lab Tests: -I ordered, reviewed, and interpreted labs.   The pertinent results include:   Labs Reviewed  COMPREHENSIVE METABOLIC PANEL - Abnormal; Notable for the following components:      Result Value   AST 13 (*)    All other components within normal limits  URINALYSIS, ROUTINE W REFLEX MICROSCOPIC - Abnormal; Notable for the following components:   Hgb urine dipstick LARGE (*)    Protein, ur TRACE (*)    Leukocytes,Ua SMALL (*)    All other components within normal limits  LIPASE, BLOOD  CBC  PREGNANCY, URINE      Imaging Studies ordered: I ordered imaging studies including US pelvis On my interpretation imaging demonstrates post operative findings I independently visualized and interpreted imaging. I agree with the radiologist interpretation   Medicines ordered and prescription drug management: Meds ordered this encounter  Medications   ketorolac (TORADOL) 15 MG/ML injection 15 mg   HYDROcodone-acetaminophen (NORCO/VICODIN) 5-325 MG tablet    Sig: Take 1 tablet by mouth every 4 (four) hours as needed.    Dispense:  10 tablet    Refill:  0    -I have reviewed the patients home medicines and have made adjustments as  needed   Consultations Obtained: I requested consultation with the ob/gyn,  and discussed lab and imaging findings as well as pertinent plan - they recommend: discharge, low concern for endometritis   Cardiac Monitoring: The patient was maintained on a cardiac monitor.  I personally viewed and interpreted the cardiac monitored which showed an underlying rhythm of: NSR  Social Determinants of Health:  Factors impacting patients care include: current smoker   Reevaluation: After the interventions noted above, I reevaluated the patient and found that they have improved  Co morbidities that complicate the patient evaluation  Past Medical History:  Diagnosis Date   Abnormal uterine bleeding (AUB)    heavy vaginal bleeding   Anticoagulant long-term use    eliquis--- managed by dr Marin Olp (hematologist)   Chronic nausea    per pt intermittant , without vomiting,  had normal EGD 12-29-2021 by dr Lorenso Courier   Chronic venous insufficiency of lower extremity    evaluted by dr Jamelle Haring (vascular) 03-15-2021 note in epic, chronic venous insuff LLE causing edema/ post thombotic syndrome   GERD (gastroesophageal reflux disease)    History of pulmonary embolus (PE) 2014   nonocclusive and RLE DVT   History of recurrent deep vein thrombosis (DVT)    LLE in 2004, 2009, 2016, 2021  & 2022 chronic DVT thrombis /  in 2014 , RLE and PE;   followed by dr Marin Olp (hematology)  negative work-up for blood disorder ,  secondary to  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  Despite best efforts to proofread,  errors can occur which can change the documentation meaning.    Cristie Hem, MD 01/16/22 [phone]

## 2022-01-23 ENCOUNTER — Other Ambulatory Visit: Payer: Self-pay | Admitting: Obstetrics and Gynecology

## 2022-02-08 ENCOUNTER — Ambulatory Visit (INDEPENDENT_AMBULATORY_CARE_PROVIDER_SITE_OTHER): Payer: Medicaid Other

## 2022-02-08 ENCOUNTER — Encounter: Payer: Self-pay | Admitting: Emergency Medicine

## 2022-02-08 ENCOUNTER — Ambulatory Visit
Admission: EM | Admit: 2022-02-08 | Discharge: 2022-02-08 | Disposition: A | Discharge status: Home / Self Care | Payer: Medicaid Other | Attending: Physician Assistant | Admitting: Physician Assistant

## 2022-02-08 DIAGNOSIS — R059 Cough, unspecified: Secondary | ICD-10-CM | POA: Diagnosis not present

## 2022-02-08 DIAGNOSIS — R062 Wheezing: Secondary | ICD-10-CM

## 2022-02-08 DIAGNOSIS — J209 Acute bronchitis, unspecified: Secondary | ICD-10-CM | POA: Diagnosis not present

## 2022-02-08 DIAGNOSIS — R051 Acute cough: Secondary | ICD-10-CM | POA: Diagnosis not present

## 2022-02-08 MED ORDER — ALBUTEROL SULFATE HFA 108 (90 BASE) MCG/ACT IN AERS: 1.0000 | INHALATION_SPRAY | Freq: Four times a day (QID) | RESPIRATORY_TRACT | 0 refills | Status: DC | PRN

## 2022-02-08 MED ORDER — HYDROCODONE BIT-HOMATROP MBR 5-1.5 MG/5ML PO SOLN: 5.0000 mL | Freq: Four times a day (QID) | ORAL | 0 refills | Status: DC | PRN

## 2022-02-08 MED ORDER — PREDNISONE 10 MG PO TABS: 10.0000 mg | ORAL_TABLET | Freq: Three times a day (TID) | ORAL | 0 refills | Status: DC

## 2022-02-08 NOTE — Discharge Instructions (Addendum)
Advised to use albuterol inhaler, 2 puffs every 6 hours on a regular basis to help decrease cough, wheezing, and shortness of breath. Advised take prednisone 10 mg 3 times a day for 5 days only to help reduce the acute inflammatory respiratory component Advised to use the Hycodan cough syrup, 1 to 2 teaspoons every 6-8 hours to help control cough and chest congestion.  Be careful with this medicine it tends to cause drowsiness and sedation in some individuals, be careful with combining with any other pain type medication. Advised follow-up PCP or return to urgent care if symptoms fail to improve.

## 2022-02-08 NOTE — ED Triage Notes (Signed)
Pt is present today with c/o cough and chest congestion. Pt sx started one week ago  

## 2022-02-08 NOTE — ED Provider Notes (Addendum)
[name]    CSN: [id number] Arrival date & time: 02/08/22  [id number]      History   Chief Complaint Chief Complaint  Patient presents with   Cough    Chest congestion    HPI Monica Holland is a 36 y.o. female.   36 year old female presents with cough and chest congestion.  Patient indicates for the past 2 weeks she has been having progressive and increasing cough, chest congestion, with yellow thick production.  Patient indicates that over the past week she has been having increasing episodes of coughing fits and coughing spasms that have not been controlled with various OTC cough preparations.  Patient also indicates that she has been having some increased shortness of breath and wheezing episodes with the coughing spasms.  She also indicates that she has had mild fatigue, and sweats with her respiratory symptoms over the past week.  Patient denies fever, or chills.  She continues to have mild upper respiratory congestion with rhinitis and postnasal drip.  She is tolerating fluids well.   Cough Associated symptoms: shortness of breath and wheezing     Past Medical History:  Diagnosis Date   Abnormal uterine bleeding (AUB)    heavy vaginal bleeding   Anticoagulant long-term use    eliquis--- managed by dr Marin Olp (hematologist)   Chronic nausea    per pt intermittant , without vomiting,  had normal EGD 12-29-2021 by dr Lorenso Courier   Chronic venous insufficiency of lower extremity    evaluted by dr Jamelle Haring (vascular) 03-15-2021 note in epic, chronic venous insuff LLE causing edema/ post thombotic syndrome   GERD (gastroesophageal reflux disease)    History of pulmonary embolus (PE) 2014   nonocclusive and RLE DVT   History of recurrent deep vein thrombosis (DVT)    LLE in 2004, 2009, 2016, 2021  & 2022 chronic DVT thrombis /  in 2014 , RLE and PE;   followed by dr Marin Olp (hematology)  negative work-up for blood disorder ,  secondary to  chronic venous insuff.  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 ([place]) 08/01/2018   Cigarette smoker 05/03/2018   Chronic venous insufficiency 09/05/2016    Past Surgical History:  Procedure Laterality Date   ANKLE SURGERY Right 2014   DILITATION & CURRETTAGE/HYSTROSCOPY WITH NOVASURE ABLATION N/A 01/04/2022   Procedure: DILATATION & CURETTAGE/HYSTEROSCOPY WITH NOVASURE ABLATION;  Surgeon: Radene Gunning, MD;  Location: [place];  Service: Gynecology;  Laterality: N/A;   ESOPHAGOGASTRODUODENOSCOPY (EGD) WITH PROPOFOL  12/29/2021   by dr Lorenso Courier   INTRAUTERINE DEVICE (IUD) INSERTION N/A 01/04/2022   Procedure: INTRAUTERINE DEVICE (IUD) INSERTION;  Surgeon: Radene Gunning, MD;  Location: [place];  Service: Gynecology;  Laterality: N/A;    OB History     Gravida  4   Para  4   Term  4   Preterm  0   AB  0   Living  0      SAB  0   IAB  0   Ectopic  0   Multiple  0   Live Births  0            Home Medications    Prior to Admission medications   Medication Sig  Start Date End Date Taking? Authorizing Provider  albuterol (VENTOLIN HFA) 108 (90 Base) MCG/ACT inhaler Inhale 1-2 puffs into the lungs every 6 (six) hours as needed for wheezing or shortness of breath. 02/08/22  Yes Nyoka Lint, PA-C  HYDROcodone bit-homatropine (HYCODAN) 5-1.5 MG/5ML syrup Take 5 mLs by mouth every 6 (six) hours as needed for cough. 02/08/22  Yes Nyoka Lint, PA-C  predniSONE (DELTASONE) 10 MG tablet Take 1 tablet (10 mg total) by mouth 3 (three) times daily. 02/08/22  Yes Nyoka Lint, PA-C  apixaban (ELIQUIS) 2.5 MG TABS tablet Take 2.5 mg by mouth 2 (two) times daily. 06/27/21   [provider]  azelastine (ASTELIN) 0.1 % nasal spray Place 2 sprays  into both nostrils 2 (two) times daily as needed. 08/16/21   [provider]  famotidine (PEPCID) 20 MG tablet Take 1 tablet (20 mg total) by mouth 2 (two) times daily. Patient taking differently: Take 20 mg by mouth 2 (two) times daily. 12/29/21   Sharyn Creamer, MD  fluticasone (FLONASE) 50 MCG/ACT nasal spray Place 1 spray into both nostrils daily as needed for rhinitis or allergies. 08/16/21   [provider]  HYDROcodone-acetaminophen (NORCO/VICODIN) 5-325 MG tablet Take 1 tablet by mouth every 4 (four) hours as needed. 01/16/22   Cristie Hem, MD  Multiple Vitamins-Minerals (VITAMIN D3 COMPLETE PO) Take by mouth as needed.    [provider]  NICOTINE TD Place onto the skin daily.    [provider]  ondansetron (ZOFRAN-ODT) 4 MG disintegrating tablet Take 1 tablet (4 mg total) by mouth every 8 (eight) hours as needed for nausea or vomiting. 11/15/21   Levin Erp, PA  pantoprazole (PROTONIX) 40 MG tablet Take 1 tablet (40 mg total) by mouth 2 (two) times daily. Patient taking differently: Take 40 mg by mouth 2 (two) times daily. 11/15/21   Levin Erp, PA  Rivaroxaban 15 & 20 MG TBPK Take as directed on package: Start with one 15mg  tablet by mouth twice a day with food. On Day 22, switch to one 20mg  tablet once a day with food. Patient not taking: Reported on 04/02/2020 03/05/13 06/25/20  Varney Biles, MD    Family History Family History  Problem Relation Age of Onset   Deep vein thrombosis Mother    Hyperlipidemia Mother    Diabetes Mother    High Cholesterol Mother    Stomach cancer Neg Hx    Colon cancer Neg Hx    Esophageal cancer Neg Hx    Rectal cancer Neg Hx     Social History Social History   Tobacco Use   Smoking status: Every Day    Types: Cigarettes   Smokeless tobacco: Never   Tobacco comments:    12-30-2021  per pt average 1-2 cig per day down from 1/ 2ppd  Vaping Use   Vaping Use: Never used  Substance  Use Topics   Alcohol use: Yes    Alcohol/week: 3.0 standard drinks of alcohol    Types: 3 Cans of beer per week   Drug use: Yes    Types: Marijuana    Comment: 01/03/2022     Allergies   Patient has no known allergies.   Review of Systems Review of Systems  Respiratory:  Positive for cough, shortness of breath and wheezing.      Physical Exam Triage Vital Signs ED Triage Vitals  Enc Vitals Group     BP 02/08/22 1019 126/88     Pulse  Rate 02/08/22 1019 88     Resp 02/08/22 1019 18     Temp 02/08/22 1019 98.1 F (36.7 C)     Temp src --      SpO2 02/08/22 1019 96 %     Weight --      Height --      Head Circumference --      Peak Flow --      Pain Score 02/08/22 1018 0     Pain Loc --      Pain Edu? --      Excl. in [place]? --    No data found.  Updated Vital Signs BP 126/88   Pulse 88   Temp 98.1 F (36.7 C)   Resp 18   SpO2 96%   Visual Acuity Right Eye Distance:   Left Eye Distance:   Bilateral Distance:    Right Eye Near:   Left Eye Near:    Bilateral Near:     Physical Exam Constitutional:      Appearance: Normal appearance.  HENT:     Right Ear: Tympanic membrane and ear canal normal.     Left Ear: Tympanic membrane and ear canal normal.     Mouth/Throat:     Mouth: Mucous membranes are moist.     Pharynx: Oropharynx is clear. No posterior oropharyngeal erythema.  Cardiovascular:     Rate and Rhythm: Normal rate and regular rhythm.     Heart sounds: Normal heart sounds.  Pulmonary:     Effort: Pulmonary effort is normal.     Breath sounds: Normal air entry. Examination of the right-lower field reveals wheezing and rhonchi. Examination of the left-lower field reveals wheezing and rhonchi. Wheezing (mild on expiration) and rhonchi (mild) present. No rales.  Lymphadenopathy:     Cervical: No cervical adenopathy.  Neurological:     Mental Status: She is alert.      UC Treatments / Results  Labs (all labs ordered are listed, but only  abnormal results are displayed) Labs Reviewed - No data to display  EKG   Radiology DG Chest 2 View  Result Date: 02/08/2022 CLINICAL DATA:  Cough and congestion 2 weeks EXAM: CHEST - 2 VIEW COMPARISON:  Chest 06/25/2020 FINDINGS: The heart size and mediastinal contours are within normal limits. Both lungs are clear. The visualized skeletal structures are unremarkable. IMPRESSION: No active cardiopulmonary disease. Electronically Signed   By: Franchot Gallo M.D.   On: 02/08/2022 11:11    Procedures Procedures (including critical care time)  Medications Ordered in UC Medications - No data to display  Initial Impression / Assessment and Plan / UC Course  I have reviewed the triage vital signs and the nursing notes.  Pertinent labs & imaging results that were available during my care of the patient were reviewed by me and considered in my medical decision making (see chart for details).    Plan: 1.  The acute cough will be treated with the following: A.  Hycodan cough syrup, 1 to 2 teaspoons every 6-8 hours as needed for cough. 2.  The wheezing will be treated with the following: A.  Albuterol inhaler, 2 puffs every 6 hours on a regular basis to decrease cough, wheezing, and shortness of breath. 3.  The acute bronchitis will be treated with the following: A.  Hycodan cough syrup, 1 to 2 teaspoons every 6-8 hours to help control cough and congestion. B.  Albuterol inhaler, 2 puffs every 6 hours to help reduce  wheezing and shortness of breath. C.  Prednisone 10 mg every 8 hours for 5 days to help reduce the respiratory inflammatory component. D.  Chest x-ray is ordered to rule out any acute infectious respiratory component. 4.  Patient advised follow-up PCP or return to urgent care if symptoms fail to improve. Final Clinical Impressions(s) / UC Diagnoses   Final diagnoses:  Acute cough  Acute bronchitis, unspecified organism  Wheezing     Discharge Instructions      Advised  to use albuterol inhaler, 2 puffs every 6 hours on a regular basis to help decrease cough, wheezing, and shortness of breath. Advised take prednisone 10 mg 3 times a day for 5 days only to help reduce the acute inflammatory respiratory component Advised to use the Hycodan cough syrup, 1 to 2 teaspoons every 6-8 hours to help control cough and chest congestion.  Be careful with this medicine it tends to cause drowsiness and sedation in some individuals, be careful with combining with any other pain type medication. Advised follow-up PCP or return to urgent care if symptoms fail to improve.    ED Prescriptions     Medication Sig Dispense Auth. Provider   HYDROcodone bit-homatropine (HYCODAN) 5-1.5 MG/5ML syrup Take 5 mLs by mouth every 6 (six) hours as needed for cough. 120 mL Nyoka Lint, PA-C   albuterol (VENTOLIN HFA) 108 (90 Base) MCG/ACT inhaler Inhale 1-2 puffs into the lungs every 6 (six) hours as needed for wheezing or shortness of breath. 8 g Nyoka Lint, PA-C   predniSONE (DELTASONE) 10 MG tablet Take 1 tablet (10 mg total) by mouth 3 (three) times daily. 15 tablet Nyoka Lint, PA-C      I have reviewed the PDMP during this encounter.   Nyoka Lint, PA-C 02/08/22 1124    Nyoka Lint, PA-C 02/08/22 1124

## 2022-03-12 ENCOUNTER — Ambulatory Visit
Admission: EM | Admit: 2022-03-12 | Discharge: 2022-03-12 | Disposition: A | Discharge status: Home / Self Care | Payer: Medicaid Other | Attending: Physician Assistant | Admitting: Physician Assistant

## 2022-03-12 ENCOUNTER — Other Ambulatory Visit: Payer: Self-pay

## 2022-03-12 ENCOUNTER — Encounter: Payer: Self-pay | Admitting: Physician Assistant

## 2022-03-12 DIAGNOSIS — S4991XD Unspecified injury of right shoulder and upper arm, subsequent encounter: Secondary | ICD-10-CM | POA: Diagnosis not present

## 2022-03-12 DIAGNOSIS — S4351XD Sprain of right acromioclavicular joint, subsequent encounter: Secondary | ICD-10-CM | POA: Diagnosis not present

## 2022-03-12 MED ORDER — METHOCARBAMOL 500 MG PO TABS: 500.0000 mg | ORAL_TABLET | Freq: Two times a day (BID) | ORAL | 0 refills | Status: DC

## 2022-03-12 MED ORDER — PREDNISONE 20 MG PO TABS: 40.0000 mg | ORAL_TABLET | Freq: Every day | ORAL | 0 refills | Status: AC

## 2022-03-12 NOTE — ED Triage Notes (Signed)
Pt here for right shoulder pain after injury; pt has seen ortho and was given prednisone that she can not find currently and is having increased pain

## 2022-03-12 NOTE — ED Provider Notes (Signed)
[name]    CSN: [id number] Arrival date & time: 03/12/22  0835      History   Chief Complaint Chief Complaint  Patient presents with   Shoulder Pain    HPI Monica Holland is a 36 y.o. female.   Patient here today requesting refill of prednisone and muscle relaxer prescribed by Ortho recently.  She reports that she accidentally lost medication and did not take any.  She states that she has had worsening right shoulder pain.  Ortho note reviewed, appeared concerned with trapezius strain and AC sprain.  Patient does not report any numbness or tingling.  She does request a note for work as she has lost this as well.  She is to call tomorrow for follow-up appointment with Ortho. She is wearing a sling provided by ortho without resolution of pain.   The history is provided by the patient.  Shoulder Pain Associated symptoms: no fever     Past Medical History:  Diagnosis Date   Abnormal uterine bleeding (AUB)    heavy vaginal bleeding   Anticoagulant long-term use    eliquis--- managed by dr Myna Hidalgo (hematologist)   Chronic nausea    per pt intermittant , without vomiting,  had normal EGD 12-29-2021 by dr [name]   Chronic venous insufficiency of lower extremity    evaluted by dr Heath Lark (vascular) 03-15-2021 note in epic, chronic venous insuff LLE causing edema/ post thombotic syndrome   GERD (gastroesophageal reflux disease)    History of pulmonary embolus (PE) 2014   nonocclusive and RLE DVT   History of recurrent deep vein thrombosis (DVT)    LLE in 2004, 2009, 2016, 2021  & 2022 chronic DVT thrombis /  in 2014 , RLE and PE;   followed by dr Myna Hidalgo (hematology)  negative work-up for blood disorder ,  secondary to  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   Wears glasses     Patient Active Problem List   Diagnosis Date Noted   Abnormal uterine bleeding (AUB) 08/31/2021   History of deep vein thrombosis 02/05/2021   Recurrent deep vein  thrombosis (DVT) of left lower extremity ([facility]) 08/01/2018   Cigarette smoker 05/03/2018   Chronic venous insufficiency 09/05/2016    Past Surgical History:  Procedure Laterality Date   ANKLE SURGERY Right 2014   DILITATION & CURRETTAGE/HYSTROSCOPY WITH NOVASURE ABLATION N/A 01/04/2022   Procedure: DILATATION & CURETTAGE/HYSTEROSCOPY WITH NOVASURE ABLATION;  Surgeon: Milas Hock, MD;  Location: [facility];  Service: Gynecology;  Laterality: N/A;   ESOPHAGOGASTRODUODENOSCOPY (EGD) WITH PROPOFOL  12/29/2021   by dr [name]   INTRAUTERINE DEVICE (IUD) INSERTION N/A 01/04/2022   Procedure: INTRAUTERINE DEVICE (IUD) INSERTION;  Surgeon: Milas Hock, MD;  Location: [facility];  Service: Gynecology;  Laterality: N/A;    OB History     Gravida  4   Para  4   Term  4   Preterm  0   AB  0   Living  0      SAB  0   IAB  0   Ectopic  0   Multiple  0   Live Births  0            Home Medications    Prior to Admission medications   Medication Sig Start Date End Date Taking? Authorizing Provider  methocarbamol (ROBAXIN) 500 MG tablet Take 1 tablet (500 mg total) by mouth 2 (two) times daily. 03/12/22  Yes  Tomi Bamberger, PA-C  predniSONE (DELTASONE) 20 MG tablet Take 2 tablets (40 mg total) by mouth daily with breakfast for 5 days. 03/12/22 03/17/22 Yes Tomi Bamberger, PA-C  albuterol (VENTOLIN HFA) 108 (90 Base) MCG/ACT inhaler Inhale 1-2 puffs into the lungs every 6 (six) hours as needed for wheezing or shortness of breath. 02/08/22   Ellsworth Lennox, PA-C  apixaban (ELIQUIS) 2.5 MG TABS tablet Take 2.5 mg by mouth 2 (two) times daily. 06/27/21   [provider]  azelastine (ASTELIN) 0.1 % nasal spray Place 2 sprays into both nostrils 2 (two) times daily as needed. 08/16/21   [provider]  famotidine (PEPCID) 20 MG tablet Take 1 tablet (20 mg total) by mouth 2 (two) times daily. Patient taking differently: Take 20 mg by  mouth 2 (two) times daily. 12/29/21   Imogene Burn, MD  fluticasone (FLONASE) 50 MCG/ACT nasal spray Place 1 spray into both nostrils daily as needed for rhinitis or allergies. 08/16/21   [provider]  HYDROcodone bit-homatropine (HYCODAN) 5-1.5 MG/5ML syrup Take 5 mLs by mouth every 6 (six) hours as needed for cough. 02/08/22   Ellsworth Lennox, PA-C  HYDROcodone-acetaminophen (NORCO/VICODIN) 5-325 MG tablet Take 1 tablet by mouth every 4 (four) hours as needed. 01/16/22   Lonell Grandchild, MD  Multiple Vitamins-Minerals (VITAMIN D3 COMPLETE PO) Take by mouth as needed.    [provider]  NICOTINE TD Place onto the skin daily.    [provider]  ondansetron (ZOFRAN-ODT) 4 MG disintegrating tablet Take 1 tablet (4 mg total) by mouth every 8 (eight) hours as needed for nausea or vomiting. 11/15/21   Unk Lightning, PA  pantoprazole (PROTONIX) 40 MG tablet Take 1 tablet (40 mg total) by mouth 2 (two) times daily. Patient taking differently: Take 40 mg by mouth 2 (two) times daily. 11/15/21   Unk Lightning, PA  Rivaroxaban 15 & 20 MG TBPK Take as directed on package: Start with one 15mg  tablet by mouth twice a day with food. On Day 22, switch to one 20mg  tablet once a day with food. Patient not taking: Reported on 04/02/2020 03/05/13 06/25/20  03/07/13, MD    Family History Family History  Problem Relation Age of Onset   Deep vein thrombosis Mother    Hyperlipidemia Mother    Diabetes Mother    High Cholesterol Mother    Stomach cancer Neg Hx    Colon cancer Neg Hx    Esophageal cancer Neg Hx    Rectal cancer Neg Hx     Social History Social History   Tobacco Use   Smoking status: Every Day    Types: Cigarettes   Smokeless tobacco: Never   Tobacco comments:    12-30-2021  per pt average 1-2 cig per day down from 1/ 2ppd  Vaping Use   Vaping Use: Never used  Substance Use Topics   Alcohol use: Yes    Alcohol/week: 3.0 standard drinks  of alcohol    Types: 3 Cans of beer per week   Drug use: Yes    Types: Marijuana    Comment: 01/03/2022     Allergies   Patient has no known allergies.   Review of Systems Review of Systems  Constitutional:  Negative for chills and fever.  Eyes:  Negative for discharge and redness.  Gastrointestinal:  Negative for nausea and vomiting.  Musculoskeletal:  Positive for arthralgias and myalgias.     Physical Exam Triage Vital Signs  ED Triage Vitals  Enc Vitals Group     BP      Pulse      Resp      Temp      Temp src      SpO2      Weight      Height      Head Circumference      Peak Flow      Pain Score      Pain Loc      Pain Edu?      Excl. in [place]?    No data found.  Updated Vital Signs BP 133/87 (BP Location: Left Arm)   Pulse 84   Temp 98.1 F (36.7 C) (Oral)   Resp 18   SpO2 97%   Visual Acuity Right Eye Distance:   Left Eye Distance:   Bilateral Distance:    Right Eye Near:   Left Eye Near:    Bilateral Near:     Physical Exam Vitals and nursing note reviewed.  Constitutional:      General: She is not in acute distress.    Appearance: Normal appearance. She is not ill-appearing.  HENT:     Head: Normocephalic and atraumatic.  Eyes:     Conjunctiva/sclera: Conjunctivae normal.  Cardiovascular:     Rate and Rhythm: Normal rate.  Pulmonary:     Effort: Pulmonary effort is normal. No respiratory distress.  Musculoskeletal:     Comments: Wearing sling to right arm  Neurological:     Mental Status: She is alert.  Psychiatric:        Mood and Affect: Mood normal.        Behavior: Behavior normal.        Thought Content: Thought content normal.      UC Treatments / Results  Labs (all labs ordered are listed, but only abnormal results are displayed) Labs Reviewed - No data to display  EKG   Radiology No results found.  Procedures Procedures (including critical care time)  Medications Ordered in UC Medications - No data to  display  Initial Impression / Assessment and Plan / UC Course  I have reviewed the triage vital signs and the nursing notes.  Pertinent labs & imaging results that were available during my care of the patient were reviewed by me and considered in my medical decision making (see chart for details).    Medications prescribed as requested. Encouraged follow up with ortho in the near future for further recommendation. Work note provided.  Final Clinical Impressions(s) / UC Diagnoses   Final diagnoses:  Injury of right shoulder, subsequent encounter  Acromioclavicular sprain, right, subsequent encounter   Discharge Instructions   None    ED Prescriptions     Medication Sig Dispense Auth. Provider   predniSONE (DELTASONE) 20 MG tablet Take 2 tablets (40 mg total) by mouth daily with breakfast for 5 days. 10 tablet Erma Pinto F, PA-C   methocarbamol (ROBAXIN) 500 MG tablet Take 1 tablet (500 mg total) by mouth 2 (two) times daily. 20 tablet Tomi Bamberger, PA-C      PDMP not reviewed this encounter.   Tomi Bamberger, PA-C 03/12/22 1045

## 2022-04-17 HISTORY — PX: SHOULDER ARTHROSCOPY: SHX128

## 2022-05-06 ENCOUNTER — Other Ambulatory Visit: Payer: Self-pay | Admitting: Oncology

## 2022-05-16 ENCOUNTER — Ambulatory Visit
Admission: EM | Admit: 2022-05-16 | Discharge: 2022-05-16 | Disposition: A | Discharge status: Home / Self Care | Payer: Medicaid Other | Attending: Physician Assistant | Admitting: Physician Assistant

## 2022-05-16 DIAGNOSIS — H65193 Other acute nonsuppurative otitis media, bilateral: Secondary | ICD-10-CM | POA: Diagnosis not present

## 2022-05-16 MED ORDER — AMOXICILLIN 500 MG PO CAPS: 500.0000 mg | ORAL_CAPSULE | Freq: Three times a day (TID) | ORAL | 0 refills | Status: DC

## 2022-05-16 NOTE — ED Provider Notes (Signed)
[name]    CSN: [id number] Arrival date & time: 05/16/22  1245      History   Chief Complaint Chief Complaint  Patient presents with   Tinnitus    HPI Monica Holland is a 37 y.o. female.   Patient here today for evaluation of ringing in her left ear that started yesterday.  She states she has associated pain with same.  She denies any issues with her right ear.  She has had some mild nasal congestion but denies any sore throat, cough.  She does not report any vomiting or diarrhea.  She has tried over-the-counter medication without resolution.  The history is provided by the patient.    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 ([place]) 08/01/2018   Cigarette smoker 05/03/2018   Chronic venous insufficiency 09/05/2016    Past Surgical History:  Procedure Laterality Date   ANKLE SURGERY Right 2014   DILITATION &  CURRETTAGE/HYSTROSCOPY WITH NOVASURE ABLATION N/A 01/04/2022   Procedure: DILATATION & CURETTAGE/HYSTEROSCOPY WITH NOVASURE ABLATION;  Surgeon: Radene Gunning, MD;  Location: [facility];  Service: Gynecology;  Laterality: N/A;   ESOPHAGOGASTRODUODENOSCOPY (EGD) WITH PROPOFOL  12/29/2021   by dr Lorenso Courier   INTRAUTERINE DEVICE (IUD) INSERTION N/A 01/04/2022   Procedure: INTRAUTERINE DEVICE (IUD) INSERTION;  Surgeon: Radene Gunning, MD;  Location: [facility] [place];  Service: Gynecology;  Laterality: N/A;    OB History     Gravida  4   Para  4   Term  4   Preterm  0   AB  0   Living  0      SAB  0   IAB  0   Ectopic  0   Multiple  0   Live Births  0            Home Medications    Prior to Admission medications   Medication Sig Start Date End Date Taking? Authorizing Provider  amoxicillin (AMOXIL) 500 MG capsule Take 1 capsule (500 mg total) by mouth 3 (three) times daily. 05/16/22  Yes Francene Finders, PA-C  albuterol (VENTOLIN HFA) 108 (90 Base) MCG/ACT inhaler Inhale 1-2 puffs into the lungs every 6 (six) hours as needed for wheezing or shortness of breath. 02/08/22   Nyoka Lint, PA-C  apixaban (ELIQUIS) 2.5 MG TABS tablet  Take 2.5 mg by mouth 2 (two) times daily. 06/27/21   [provider]  azelastine (ASTELIN) 0.1 % nasal spray Place 2 sprays into both nostrils 2 (two) times daily as needed. 08/16/21   [provider]  famotidine (PEPCID) 20 MG tablet Take 1 tablet (20 mg total) by mouth 2 (two) times daily. Patient taking differently: Take 20 mg by mouth 2 (two) times daily. 12/29/21   Sharyn Creamer, MD  fluticasone (FLONASE) 50 MCG/ACT nasal spray Place 1 spray into both nostrils daily as needed for rhinitis or allergies. 08/16/21   [provider]  HYDROcodone bit-homatropine (HYCODAN) 5-1.5 MG/5ML syrup Take 5 mLs by mouth every 6 (six) hours as needed for cough. 02/08/22   Nyoka Lint, PA-C   HYDROcodone-acetaminophen (NORCO/VICODIN) 5-325 MG tablet Take 1 tablet by mouth every 4 (four) hours as needed. 01/16/22   Cristie Hem, MD  methocarbamol (ROBAXIN) 500 MG tablet Take 1 tablet (500 mg total) by mouth 2 (two) times daily. 03/12/22   Francene Finders, PA-C  Multiple Vitamins-Minerals (VITAMIN D3 COMPLETE PO) Take by mouth as needed.    [provider]  NICOTINE TD Place onto the skin daily.    [provider]  ondansetron (ZOFRAN-ODT) 4 MG disintegrating tablet Take 1 tablet (4 mg total) by mouth every 8 (eight) hours as needed for nausea or vomiting. 11/15/21   Levin Erp, PA  pantoprazole (PROTONIX) 40 MG tablet Take 1 tablet (40 mg total) by mouth 2 (two) times daily. Patient taking differently: Take 40 mg by mouth 2 (two) times daily. 11/15/21   Levin Erp, PA  Rivaroxaban 15 & 20 MG TBPK Take as directed on package: Start with one 15mg  tablet by mouth twice a day with food. On Day 22, switch to one 20mg  tablet once a day with food. Patient not taking: Reported on 04/02/2020 03/05/13 06/25/20  Varney Biles, MD    Family History Family History  Problem Relation Age of Onset   Deep vein thrombosis Mother    Hyperlipidemia Mother    Diabetes Mother    High Cholesterol Mother    Stomach cancer Neg Hx    Colon cancer Neg Hx    Esophageal cancer Neg Hx    Rectal cancer Neg Hx     Social History Social History   Tobacco Use   Smoking status: Every Day    Types: Cigarettes   Smokeless tobacco: Never   Tobacco comments:    12-30-2021  per pt average 1-2 cig per day down from 1/ 2ppd  Vaping Use   Vaping Use: Never used  Substance Use Topics   Alcohol use: Yes    Alcohol/week: 3.0 standard drinks of alcohol    Types: 3 Cans of beer per week   Drug use: Yes    Types: Marijuana    Comment: 01/03/2022     Allergies   Patient has no known allergies.   Review of Systems Review of Systems  Constitutional:  Negative  for chills and fever.  HENT:  Positive for congestion and ear pain. Negative for sore throat.   Eyes:  Negative for discharge and redness.  Respiratory:  Negative for cough, shortness of breath and wheezing.   Gastrointestinal:  Negative for abdominal pain, diarrhea, nausea and vomiting.     Physical Exam Triage Vital Signs ED Triage Vitals  Enc Vitals Group     BP 05/16/22 1411 (!) 135/93     Pulse Rate 05/16/22 1410 76  Resp 05/16/22 1410 17     Temp 05/16/22 1410 98 F (36.7 C)     Temp Source 05/16/22 1410 Oral     SpO2 05/16/22 1410 97 %     Weight --      Height --      Head Circumference --      Peak Flow --      Pain Score 05/16/22 1410 3     Pain Loc --      Pain Edu? --      Excl. in [place]? --    No data found.  Updated Vital Signs BP (!) 135/93   Pulse 76   Temp 98 F (36.7 C) (Oral)   Resp 17   SpO2 97%     Physical Exam Vitals and nursing note reviewed.  Constitutional:      General: She is not in acute distress.    Appearance: Normal appearance. She is not ill-appearing.  HENT:     Head: Normocephalic and atraumatic.     Ears:     Comments: Bilateral TMs erythematous and bulging, left worse than right    Nose: Congestion present.     Mouth/Throat:     Mouth: Mucous membranes are moist.     Pharynx: No oropharyngeal exudate or posterior oropharyngeal erythema.  Eyes:     Conjunctiva/sclera: Conjunctivae normal.  Cardiovascular:     Rate and Rhythm: Normal rate and regular rhythm.     Heart sounds: Normal heart sounds. No murmur heard. Pulmonary:     Effort: Pulmonary effort is normal. No respiratory distress.     Breath sounds: Normal breath sounds. No wheezing, rhonchi or rales.  Skin:    General: Skin is warm and dry.  Neurological:     Mental Status: She is alert.  Psychiatric:        Mood and Affect: Mood normal.        Thought Content: Thought content normal.      UC Treatments / Results  Labs (all labs ordered are listed, but  only abnormal results are displayed) Labs Reviewed - No data to display  EKG   Radiology No results found.  Procedures Procedures (including critical care time)  Medications Ordered in UC Medications - No data to display  Initial Impression / Assessment and Plan / UC Course  I have reviewed the triage vital signs and the nursing notes.  Pertinent labs & imaging results that were available during my care of the patient were reviewed by me and considered in my medical decision making (see chart for details).    Will treat to cover bilateral otitis media.  Recommended Tylenol or ibuprofen if needed for pain.  Encouraged follow-up if no gradual improvement or with any further concerns.  Final Clinical Impressions(s) / UC Diagnoses   Final diagnoses:  Other acute nonsuppurative otitis media of both ears, recurrence not specified     Discharge Instructions      Ok to take tylenol and ibuprofen to help with pain if needed.     ED Prescriptions     Medication Sig Dispense Auth. Provider   amoxicillin (AMOXIL) 500 MG capsule Take 1 capsule (500 mg total) by mouth 3 (three) times daily. 21 capsule Francene Finders, PA-C      PDMP not reviewed this encounter.   Francene Finders, PA-C 05/16/22 1521

## 2022-05-16 NOTE — Discharge Instructions (Signed)
Ok to take tylenol and ibuprofen to help with pain if needed.

## 2022-05-16 NOTE — ED Triage Notes (Signed)
Pt presents with ringing in left ear since yesterday.

## 2022-05-27 ENCOUNTER — Other Ambulatory Visit: Payer: Self-pay

## 2022-05-27 ENCOUNTER — Encounter (HOSPITAL_BASED_OUTPATIENT_CLINIC_OR_DEPARTMENT_OTHER): Payer: Self-pay | Admitting: Emergency Medicine

## 2022-05-27 ENCOUNTER — Emergency Department (HOSPITAL_BASED_OUTPATIENT_CLINIC_OR_DEPARTMENT_OTHER)
Admission: EM | Admit: 2022-05-27 | Discharge: 2022-05-27 | Disposition: A | Discharge status: Home / Self Care | Payer: Medicaid Other | Attending: Emergency Medicine | Admitting: Emergency Medicine

## 2022-05-27 DIAGNOSIS — H6693 Otitis media, unspecified, bilateral: Secondary | ICD-10-CM | POA: Insufficient documentation

## 2022-05-27 DIAGNOSIS — Z7901 Long term (current) use of anticoagulants: Secondary | ICD-10-CM | POA: Diagnosis not present

## 2022-05-27 DIAGNOSIS — H6690 Otitis media, unspecified, unspecified ear: Secondary | ICD-10-CM

## 2022-05-27 DIAGNOSIS — H9203 Otalgia, bilateral: Secondary | ICD-10-CM | POA: Diagnosis present

## 2022-05-27 MED ORDER — AMOXICILLIN-POT CLAVULANATE 875-125 MG PO TABS: 1.0000 | ORAL_TABLET | Freq: Two times a day (BID) | ORAL | 0 refills | Status: DC

## 2022-05-27 NOTE — Discharge Instructions (Signed)
Please pick up the medication I have prescribed for you and take as prescribed.  Please make an appointment with your primary care to be evaluated in the next few days regarding recent ER visit.  Symptoms worsen please return to ER.

## 2022-05-27 NOTE — ED Provider Notes (Signed)
[facility] Provider Note   CSN: [id number] Arrival date & time: 05/27/22  1113     History  Chief Complaint  Patient presents with   Otalgia    Monica Holland is a 37 y.o. female presented with bilateral ear pain that has been present for a week.  Patient was seen last week and diagnosed with bilateral AOM and given amoxicillin.  Patient has been taking amoxicillin twice daily instead of 3 times daily.  Patient states ear pain is still present and believes she still has an infection.  Patient endorsed sinus pressure and pain with extraocular movements.  Patient denied fevers, shortness of breath, abdominal pain,   Home Medications Prior to Admission medications   Medication Sig Start Date End Date Taking? Authorizing Provider  amoxicillin-clavulanate (AUGMENTIN) 875-125 MG tablet Take 1 tablet by mouth every 12 (twelve) hours. 05/27/22  Yes Adelina Collard, Florene Route, PA-C  albuterol (VENTOLIN HFA) 108 (90 Base) MCG/ACT inhaler Inhale 1-2 puffs into the lungs every 6 (six) hours as needed for wheezing or shortness of breath. 02/08/22   Nyoka Lint, PA-C  amoxicillin (AMOXIL) 500 MG capsule Take 1 capsule (500 mg total) by mouth 3 (three) times daily. 05/16/22   Francene Finders, PA-C  apixaban (ELIQUIS) 2.5 MG TABS tablet Take 2.5 mg by mouth 2 (two) times daily. 06/27/21   [provider]  azelastine (ASTELIN) 0.1 % nasal spray Place 2 sprays into both nostrils 2 (two) times daily as needed. 08/16/21   [provider]  famotidine (PEPCID) 20 MG tablet Take 1 tablet (20 mg total) by mouth 2 (two) times daily. Patient taking differently: Take 20 mg by mouth 2 (two) times daily. 12/29/21   Sharyn Creamer, MD  fluticasone (FLONASE) 50 MCG/ACT nasal spray Place 1 spray into both nostrils daily as needed for rhinitis or allergies. 08/16/21   [provider]  HYDROcodone bit-homatropine (HYCODAN) 5-1.5 MG/5ML syrup Take 5 mLs by mouth  every 6 (six) hours as needed for cough. 02/08/22   Nyoka Lint, PA-C  HYDROcodone-acetaminophen (NORCO/VICODIN) 5-325 MG tablet Take 1 tablet by mouth every 4 (four) hours as needed. 01/16/22   Cristie Hem, MD  methocarbamol (ROBAXIN) 500 MG tablet Take 1 tablet (500 mg total) by mouth 2 (two) times daily. 03/12/22   Francene Finders, PA-C  Multiple Vitamins-Minerals (VITAMIN D3 COMPLETE PO) Take by mouth as needed.    [provider]  NICOTINE TD Place onto the skin daily.    [provider]  ondansetron (ZOFRAN-ODT) 4 MG disintegrating tablet Take 1 tablet (4 mg total) by mouth every 8 (eight) hours as needed for nausea or vomiting. 11/15/21   Levin Erp, PA  pantoprazole (PROTONIX) 40 MG tablet Take 1 tablet (40 mg total) by mouth 2 (two) times daily. Patient taking differently: Take 40 mg by mouth 2 (two) times daily. 11/15/21   Levin Erp, PA  Rivaroxaban 15 & 20 MG TBPK Take as directed on package: Start with one 65m tablet by mouth twice a day with food. On Day 22, switch to one 235mtablet once a day with food. Patient not taking: Reported on 04/02/2020 03/05/13 06/25/20  NaVarney BilesMD      Allergies    Patient has no known allergies.    Review of Systems   Review of Systems  HENT:  Positive for ear pain.     Physical Exam Updated Vital Signs BP 114/80   Pulse  76   Temp 98.5 F (36.9 C)   Resp 18   SpO2 (!) 79%  Physical Exam Vitals and nursing note reviewed.  Constitutional:      General: She is not in acute distress.    Appearance: She is well-developed.  HENT:     Head: Normocephalic and atraumatic.     Right Ear: No foreign body. No mastoid tenderness. Tympanic membrane is erythematous. Tympanic membrane is not perforated.     Left Ear: No foreign body. No mastoid tenderness. Tympanic membrane is erythematous. Tympanic membrane is not perforated.     Nose: Nose normal.     Mouth/Throat:     Mouth: Mucous membranes  are moist.     Pharynx: No oropharyngeal exudate.  Eyes:     Conjunctiva/sclera: Conjunctivae normal.     Pupils: Pupils are equal, round, and reactive to light.     Comments: Patient had intact extraocular movements but endorsed pain with movement of eyes and tenderness to palpation around the right eye  Cardiovascular:     Rate and Rhythm: Normal rate and regular rhythm.     Heart sounds: No murmur heard. Pulmonary:     Effort: Pulmonary effort is normal. No respiratory distress.     Breath sounds: Normal breath sounds.  Abdominal:     Palpations: Abdomen is soft.     Tenderness: There is no abdominal tenderness. There is no guarding or rebound.  Musculoskeletal:        General: No swelling. Normal range of motion.     Cervical back: Normal range of motion and neck supple.  Skin:    General: Skin is warm and dry.     Capillary Refill: Capillary refill takes less than 2 seconds.  Neurological:     General: No focal deficit present.     Mental Status: She is alert and oriented to person, place, and time.  Psychiatric:        Mood and Affect: Mood normal.     ED Results / Procedures / Treatments   Labs (all labs ordered are listed, but only abnormal results are displayed) Labs Reviewed - No data to display  EKG None  Radiology No results found.  Procedures Procedures    Medications Ordered in ED Medications - No data to display  ED Course/ Medical Decision Making/ A&P                             Medical Decision Making  Messiah J Ureta 37 y.o. presented today for ear pain. Working DDx that I considered at this time includes, but not limited to, AOM, otitis externa, mastoiditis, preseptal/orbital cellulitis.  Review of prior external notes: 05/16/22 ED  Unique Tests and My Interpretation: none  Discussion with Independent Historian: Spouse  Discussion of Management of Tests: none  Risk:    Medium:  - prescription drug management   Risk Stratification  Score: none  Staffed with Tegeler, MD  R/o DDx: preseptal/orbital cellulitis: no erythema around orbits, denied eye pain Mastoiditis: No area of fluctuance or erythema or overlying skin color changes in the mastoids Otitis externa: Patient had no pain with pinna manipulation  Plan: Patient presented with ear pain after taking antibiotics the previous week.  Patient endorsed not taking antibiotics as prescribed.  Patient states she had pain with extraocular movements on exam however this is most likely sinus pressure from the ear infection as opposed to preseptal/orbital cellulitis at this  time.  Patient will be given Augmentin and encouraged to follow with primary care provider.  Patient's vitals are stable for discharge at this time.  Patient verbalized agreement to this plan.         Final Clinical Impression(s) / ED Diagnoses Final diagnoses:  Acute otitis media, unspecified otitis media type    Rx / DC Orders ED Discharge Orders          Ordered    amoxicillin-clavulanate (AUGMENTIN) 875-125 MG tablet  Every 12 hours        05/27/22 1224              Elvina Sidle 05/27/22 1250    Tegeler, Gwenyth Allegra, MD 05/27/22 [phone]

## 2022-05-27 NOTE — ED Triage Notes (Signed)
Left ear pain last week, seen at urgent care, prescribed amoxicillin , supposed to take tid and pt has been taking it bid. He is not feeling any better.

## 2022-07-04 ENCOUNTER — Inpatient Hospital Stay: Payer: Medicaid Other

## 2022-07-04 ENCOUNTER — Ambulatory Visit: Payer: Medicaid Other | Admitting: Family

## 2022-07-04 ENCOUNTER — Inpatient Hospital Stay: Payer: Medicaid Other | Admitting: Family

## 2022-07-04 ENCOUNTER — Other Ambulatory Visit: Payer: Medicaid Other

## 2022-07-10 ENCOUNTER — Inpatient Hospital Stay (HOSPITAL_BASED_OUTPATIENT_CLINIC_OR_DEPARTMENT_OTHER): Payer: Medicaid Other | Admitting: Family

## 2022-07-10 ENCOUNTER — Encounter: Payer: Self-pay | Admitting: Family

## 2022-07-10 ENCOUNTER — Inpatient Hospital Stay: Payer: Medicaid Other | Attending: Hematology & Oncology

## 2022-07-10 VITALS — BP 120/86 | HR 84 | Temp 98.7°F | Resp 17 | Wt 195.0 lb

## 2022-07-10 DIAGNOSIS — Z7901 Long term (current) use of anticoagulants: Secondary | ICD-10-CM | POA: Insufficient documentation

## 2022-07-10 DIAGNOSIS — I82512 Chronic embolism and thrombosis of left femoral vein: Secondary | ICD-10-CM

## 2022-07-10 DIAGNOSIS — Z79899 Other long term (current) drug therapy: Secondary | ICD-10-CM | POA: Diagnosis not present

## 2022-07-10 DIAGNOSIS — I2699 Other pulmonary embolism without acute cor pulmonale: Secondary | ICD-10-CM | POA: Diagnosis not present

## 2022-07-10 DIAGNOSIS — I82502 Chronic embolism and thrombosis of unspecified deep veins of left lower extremity: Secondary | ICD-10-CM | POA: Diagnosis present

## 2022-07-10 DIAGNOSIS — D6859 Other primary thrombophilia: Secondary | ICD-10-CM

## 2022-07-10 LAB — CMP (CANCER CENTER ONLY)
ALT: 13 U/L (ref 0–44)
AST: 14 U/L — ABNORMAL LOW (ref 15–41)
Albumin: 4.5 g/dL (ref 3.5–5.0)
Alkaline Phosphatase: 50 U/L (ref 38–126)
Anion gap: 6 (ref 5–15)
BUN: 12 mg/dL (ref 6–20)
CO2: 29 mmol/L (ref 22–32)
Calcium: 9.4 mg/dL (ref 8.9–10.3)
Chloride: 103 mmol/L (ref 98–111)
Creatinine: 0.87 mg/dL (ref 0.44–1.00)
GFR, Estimated: >60 mL/min
Glucose, Bld: 88 mg/dL (ref 70–99)
Potassium: 4.2 mmol/L (ref 3.5–5.1)
Sodium: 138 mmol/L (ref 135–145)
Total Bilirubin: 0.6 mg/dL (ref 0.3–1.2)
Total Protein: 6.7 g/dL (ref 6.5–8.1)

## 2022-07-10 LAB — CBC WITH DIFFERENTIAL (CANCER CENTER ONLY)
Abs Immature Granulocytes: 0.01 10*3/uL (ref 0.00–0.07)
Basophils Absolute: 0 10*3/uL (ref 0.0–0.1)
Basophils Relative: 1 %
Eosinophils Absolute: 0.1 10*3/uL (ref 0.0–0.5)
Eosinophils Relative: 2 %
HCT: 39.2 % (ref 36.0–46.0)
Hemoglobin: 12.9 g/dL (ref 12.0–15.0)
Immature Granulocytes: 0 %
Lymphocytes Relative: 38 %
Lymphs Abs: 2.1 10*3/uL (ref 0.7–4.0)
MCH: 28 pg (ref 26.0–34.0)
MCHC: 32.9 g/dL (ref 30.0–36.0)
MCV: 85 fL (ref 80.0–100.0)
Monocytes Absolute: 0.5 10*3/uL (ref 0.1–1.0)
Monocytes Relative: 9 %
Neutro Abs: 2.8 10*3/uL (ref 1.7–7.7)
Neutrophils Relative %: 50 %
Platelet Count: 165 10*3/uL (ref 150–400)
RBC: 4.61 MIL/uL (ref 3.87–5.11)
RDW: 16.1 % — ABNORMAL HIGH (ref 11.5–15.5)
WBC Count: 5.6 10*3/uL (ref 4.0–10.5)
nRBC: 0 % (ref 0.0–0.2)

## 2022-07-10 LAB — LACTATE DEHYDROGENASE: LDH: 136 U/L (ref 98–192)

## 2022-07-10 NOTE — Progress Notes (Signed)
Hematology and Oncology Follow Up Visit  Monica Holland [id number] [birth year] 37 y.o. 07/10/2022   Principle Diagnosis:  Chronic DVT of the left lower extremity  History of PE right lower lobe   Current Therapy:        Eliquis 5 mg PO BID   Interim History:  Monica Holland is here today for follow-up. She is having some new pain and swelling in her right leg. We will get an [place] to better assess this.  She has been taking her Eliquis PO BID as prescribed.  Swelling in her leg unchanged from baseline. Pedal pulses are 1+.  No numbness or tingling in his extremities.  No fever, chills, n/v, cough, rash, dizziness, SOB, chest pain, palpitations, abdominal pain or changes in bowel or bladder habits.  No blood loss. Bruising or petechiae.  She has only had some light spotting since having the ablation in 12/2021.  No falls or syncope.  Appetite and hydration are good. Weight is 195 lbs.   ECOG Performance Status: 1 - Symptomatic but completely ambulatory  Medications:  Allergies as of 07/10/2022   No Known Allergies      Medication List        Accurate as of July 10, 2022  3:45 PM. If you have any questions, ask your nurse or doctor.          albuterol 108 (90 Base) MCG/ACT inhaler Commonly known as: VENTOLIN HFA Inhale 1-2 puffs into the lungs every 6 (six) hours as needed for wheezing or shortness of breath.   amoxicillin 500 MG capsule Commonly known as: AMOXIL Take 1 capsule (500 mg total) by mouth 3 (three) times daily.   amoxicillin-clavulanate 875-125 MG tablet Commonly known as: AUGMENTIN Take 1 tablet by mouth every 12 (twelve) hours.   Eliquis 5 MG Tabs tablet Generic drug: apixaban Take 5 mg by mouth 2 (two) times daily.   apixaban 2.5 MG Tabs tablet Commonly known as: ELIQUIS Take 2.5 mg by mouth 2 (two) times daily.   azelastine 0.1 % nasal spray Commonly known as: ASTELIN Place 2 sprays into both nostrils 2 (two) times daily as needed.    famotidine 20 MG tablet Commonly known as: Pepcid Take 1 tablet (20 mg total) by mouth 2 (two) times daily.   fluticasone 50 MCG/ACT nasal spray Commonly known as: FLONASE Place 1 spray into both nostrils daily as needed for rhinitis or allergies.   HYDROcodone bit-homatropine 5-1.5 MG/5ML syrup Commonly known as: HYCODAN Take 5 mLs by mouth every 6 (six) hours as needed for cough.   HYDROcodone-acetaminophen 5-325 MG tablet Commonly known as: NORCO/VICODIN Take 1 tablet by mouth every 4 (four) hours as needed.   methocarbamol 500 MG tablet Commonly known as: ROBAXIN Take 1 tablet (500 mg total) by mouth 2 (two) times daily.   NICOTINE TD Place onto the skin daily.   ondansetron 4 MG disintegrating tablet Commonly known as: ZOFRAN-ODT Take 1 tablet (4 mg total) by mouth every 8 (eight) hours as needed for nausea or vomiting.   pantoprazole 40 MG tablet Commonly known as: PROTONIX Take 1 tablet (40 mg total) by mouth 2 (two) times daily.   Vitamin D3 1.25 MG (50000 UT) capsule Generic drug: Cholecalciferol Take 50,000 Units by mouth once a week.   VITAMIN D3 COMPLETE PO Take by mouth as needed.        Allergies: No Known Allergies  Past Medical History, Surgical history, Social history, and Family History were reviewed and updated.  Review of Systems: All other 10 point review of systems is negative.   Physical Exam:  weight is 195 lb (88.5 kg). Her oral temperature is 98.7 F (37.1 C). Her blood pressure is 120/86 and her pulse is 84. Her respiration is 17 and oxygen saturation is 100%.   Wt Readings from Last 3 Encounters:  07/10/22 195 lb (88.5 kg)  01/16/22 170 lb (77.1 kg)  01/04/22 170 lb 3.2 oz (77.2 kg)    Ocular: Sclerae unicteric, pupils equal, round and reactive to light Ear-nose-throat: Oropharynx clear, dentition fair Lymphatic: No cervical or supraclavicular adenopathy Lungs no rales or rhonchi, good excursion bilaterally Heart regular  rate and rhythm, no murmur appreciated Abd soft, nontender, positive bowel sounds MSK no focal spinal tenderness, no joint edema Neuro: non-focal, well-oriented, appropriate affect Breasts: Deferred   Lab Results  Component Value Date   WBC 5.6 07/10/2022   HGB 12.9 07/10/2022   HCT 39.2 07/10/2022   MCV 85.0 07/10/2022   PLT 165 07/10/2022   Lab Results  Component Value Date   FERRITIN 24 03/04/2008   IRON 29 (L) 03/04/2008   TIBC 440 03/04/2008   UIBC 411 03/04/2008   IRONPCTSAT 7 (L) 03/04/2008   Lab Results  Component Value Date   RBC 4.61 07/10/2022   No results found for: "KPAFRELGTCHN", "LAMBDASER", "KAPLAMBRATIO" No results found for: "IGGSERUM", "IGA", "IGMSERUM" No results found for: "TOTALPROTELP", "ALBUMINELP", "A1GS", "A2GS", "BETS", "BETA2SER", "GAMS", "MSPIKE", "SPEI"   Chemistry      Component Value Date/Time   NA 138 07/10/2022 1457   K 4.2 07/10/2022 1457   CL 103 07/10/2022 1457   CO2 29 07/10/2022 1457   BUN 12 07/10/2022 1457   CREATININE 0.87 07/10/2022 1457      Component Value Date/Time   CALCIUM 9.4 07/10/2022 1457   ALKPHOS 50 07/10/2022 1457   AST 14 (L) 07/10/2022 1457   ALT 13 07/10/2022 1457   BILITOT 0.6 07/10/2022 1457       Impression and Plan: Monica Holland is a very pleasant 37 yo African American female with history of multiple recurrent thrombotic events now on life anticoagulation with Eliquis. She also has family history of thrombotic events on maternal and paternal sides.  Hyper coag panel was negative.  She will continue her same regimen with maintenance Eliquis.  We will get an US of the right leg this week when she is able.  Lab check in 6 months, follow-up in 1 year. Subject to change pending [place] result.   Lottie Dawson, NP 3/25/20243:45 PM

## 2022-07-13 ENCOUNTER — Ambulatory Visit (HOSPITAL_BASED_OUTPATIENT_CLINIC_OR_DEPARTMENT_OTHER)
Admission: RE | Admit: 2022-07-13 | Discharge: 2022-07-13 | Disposition: A | Discharge status: Home / Self Care | Payer: Medicaid Other | Source: Ambulatory Visit | Attending: Family | Admitting: Family

## 2022-07-13 DIAGNOSIS — D6859 Other primary thrombophilia: Secondary | ICD-10-CM | POA: Diagnosis present

## 2022-07-14 ENCOUNTER — Telehealth: Payer: Self-pay

## 2022-07-14 NOTE — Telephone Encounter (Signed)
-----   Message from Celso Amy, NP sent at 07/14/2022 10:57 AM EDT ----- No DVT!!!!! WOOO HOOOO!!!!!  Sarah  ----- Message ----- From: Interface, Rad Results In Sent: 07/13/2022   3:45 PM EDT To: Celso Amy, NP

## 2022-11-11 ENCOUNTER — Ambulatory Visit
Admission: EM | Admit: 2022-11-11 | Discharge: 2022-11-11 | Disposition: A | Discharge status: Home / Self Care | Payer: Medicaid Other | Source: Home / Self Care

## 2022-11-11 DIAGNOSIS — G8929 Other chronic pain: Secondary | ICD-10-CM

## 2022-11-11 DIAGNOSIS — S4991XD Unspecified injury of right shoulder and upper arm, subsequent encounter: Secondary | ICD-10-CM | POA: Diagnosis not present

## 2022-11-11 DIAGNOSIS — M25511 Pain in right shoulder: Secondary | ICD-10-CM | POA: Diagnosis not present

## 2022-11-11 MED ORDER — LIDOCAINE 5 % EX OINT: 1.0000 | TOPICAL_OINTMENT | CUTANEOUS | 0 refills | Status: DC | PRN

## 2022-11-11 MED ORDER — PREDNISONE 20 MG PO TABS: 40.0000 mg | ORAL_TABLET | Freq: Every day | ORAL | 0 refills | Status: AC

## 2022-11-11 MED ORDER — METHOCARBAMOL 500 MG PO TABS: 500.0000 mg | ORAL_TABLET | Freq: Two times a day (BID) | ORAL | 0 refills | Status: DC

## 2022-11-11 NOTE — ED Triage Notes (Signed)
Pt presents to UC w/ c/o right shoulder pain which has worsened over the past few months. Pt had an MRI yesterday of the right shoulder but states tylenol is not helping the pain.
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
## 2022-11-11 NOTE — Discharge Instructions (Signed)
We are limited in the medications that we can use because you are taking a blood thinner.  Try prednisone 40 mg for 3 days.  This should help with inflammation.  You can use Tylenol for breakthrough pain.  Take methocarbamol to twice a day.  This should help with muscle spasms and neck pain associated with your injury.  This will make you sleepy so do not drive or drink alcohol with taking it.  Use lidocaine ointment as needed for additional pain relief.  We have placed you in a sling but I want you to use this as infrequently as possible and no more than for 1 to 2 days as it can cause complications such as frozen shoulder.  It is importantly follow-up with your orthopedic provider as soon as possible preferably on Monday.  If anything worsens you have increasing pain, numbness or tingling in your hand you should be seen immediately.

## 2022-11-17 ENCOUNTER — Telehealth: Payer: Self-pay | Admitting: *Deleted

## 2022-11-17 NOTE — Telephone Encounter (Signed)
This nurse called patient and left a message stating,"you need to be seen in the office before we can clear you for surgery. Last time you were here was March 2024. Can you come to the office on Monday, August, 5th, and be seen by a provider? Please call back and let us know."

## 2022-11-20 ENCOUNTER — Encounter: Payer: Self-pay | Admitting: Family

## 2022-11-20 ENCOUNTER — Inpatient Hospital Stay (HOSPITAL_BASED_OUTPATIENT_CLINIC_OR_DEPARTMENT_OTHER): Payer: Medicaid Other | Admitting: Family

## 2022-11-20 ENCOUNTER — Other Ambulatory Visit: Payer: Self-pay | Admitting: *Deleted

## 2022-11-20 ENCOUNTER — Inpatient Hospital Stay: Payer: Medicaid Other | Attending: Hematology & Oncology

## 2022-11-20 VITALS — BP 131/92 | HR 87 | Temp 98.3°F | Resp 18 | Wt 171.1 lb

## 2022-11-20 DIAGNOSIS — R202 Paresthesia of skin: Secondary | ICD-10-CM | POA: Diagnosis not present

## 2022-11-20 DIAGNOSIS — D6859 Other primary thrombophilia: Secondary | ICD-10-CM | POA: Diagnosis not present

## 2022-11-20 DIAGNOSIS — I82512 Chronic embolism and thrombosis of left femoral vein: Secondary | ICD-10-CM

## 2022-11-20 DIAGNOSIS — Z7901 Long term (current) use of anticoagulants: Secondary | ICD-10-CM | POA: Insufficient documentation

## 2022-11-20 DIAGNOSIS — I82502 Chronic embolism and thrombosis of unspecified deep veins of left lower extremity: Secondary | ICD-10-CM | POA: Diagnosis present

## 2022-11-20 DIAGNOSIS — R200 Anesthesia of skin: Secondary | ICD-10-CM | POA: Diagnosis not present

## 2022-11-20 DIAGNOSIS — Z86711 Personal history of pulmonary embolism: Secondary | ICD-10-CM | POA: Diagnosis not present

## 2022-11-20 DIAGNOSIS — Z86718 Personal history of other venous thrombosis and embolism: Secondary | ICD-10-CM

## 2022-11-20 DIAGNOSIS — M25511 Pain in right shoulder: Secondary | ICD-10-CM | POA: Insufficient documentation

## 2022-11-20 LAB — CBC WITH DIFFERENTIAL (CANCER CENTER ONLY)
Abs Immature Granulocytes: 0.01 10*3/uL (ref 0.00–0.07)
Basophils Absolute: 0 10*3/uL (ref 0.0–0.1)
Basophils Relative: 1 %
Eosinophils Absolute: 0.1 10*3/uL (ref 0.0–0.5)
Eosinophils Relative: 2 %
HCT: 44.9 % (ref 36.0–46.0)
Hemoglobin: 14.9 g/dL (ref 12.0–15.0)
Immature Granulocytes: 0 %
Lymphocytes Relative: 47 %
Lymphs Abs: 1.5 10*3/uL (ref 0.7–4.0)
MCH: 28.1 pg (ref 26.0–34.0)
MCHC: 33.2 g/dL (ref 30.0–36.0)
MCV: 84.6 fL (ref 80.0–100.0)
Monocytes Absolute: 0.3 10*3/uL (ref 0.1–1.0)
Monocytes Relative: 8 %
Neutro Abs: 1.3 10*3/uL — ABNORMAL LOW (ref 1.7–7.7)
Neutrophils Relative %: 42 %
Platelet Count: 233 10*3/uL (ref 150–400)
RBC: 5.31 MIL/uL — ABNORMAL HIGH (ref 3.87–5.11)
RDW: 14.7 % (ref 11.5–15.5)
WBC Count: 3.1 10*3/uL — ABNORMAL LOW (ref 4.0–10.5)
nRBC: 0 % (ref 0.0–0.2)

## 2022-11-20 LAB — CMP (CANCER CENTER ONLY)
ALT: 12 U/L (ref 0–44)
AST: 14 U/L — ABNORMAL LOW (ref 15–41)
Albumin: 4.5 g/dL (ref 3.5–5.0)
Alkaline Phosphatase: 45 U/L (ref 38–126)
Anion gap: 4 — ABNORMAL LOW (ref 5–15)
BUN: 8 mg/dL (ref 6–20)
CO2: 31 mmol/L (ref 22–32)
Calcium: 9.6 mg/dL (ref 8.9–10.3)
Chloride: 105 mmol/L (ref 98–111)
Creatinine: 0.96 mg/dL (ref 0.44–1.00)
GFR, Estimated: >60 mL/min (ref >60)
Glucose, Bld: 102 mg/dL — ABNORMAL HIGH (ref 70–99)
Potassium: 4.2 mmol/L (ref 3.5–5.1)
Sodium: 140 mmol/L (ref 135–145)
Total Bilirubin: 0.5 mg/dL (ref 0.3–1.2)
Total Protein: 7.4 g/dL (ref 6.5–8.1)

## 2022-11-20 LAB — LACTATE DEHYDROGENASE: LDH: 143 U/L (ref 98–192)

## 2022-11-20 NOTE — Progress Notes (Signed)
Hematology and Oncology Follow Up Visit  Monica Holland [id number] [birth year] 37 y.o. 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 Kirtland Bouchard. Supple on 11/24/2022.  She is having pain in the right shoulder.  No swelling in her extremities at this time. She has occasional puffiness in the left lower extremity at times. This resolves on its own.  She has occasional numbness and tingling in her hands and feet that comes and goes.  No falls or syncope reported.  No blood loss, bruising or petechiae.  No fever, chills, n/v, cough, rash, dizziness, SOB, chest pain, palpitations, abdominal pain or changes in bowel or bladder habits.  Appetite and hydration are good. Weight is stable at 171 lbs.   ECOG Performance Status: 1 - Symptomatic but completely ambulatory  Medications:  Allergies as of 11/20/2022   No Known Allergies      Medication List        Accurate as of November 20, 2022 10:47 AM. If you have any questions, ask your nurse or doctor.          albuterol 108 (90 Base) MCG/ACT inhaler Commonly known as: VENTOLIN HFA Inhale 1-2 puffs into the lungs every 6 (six) hours as needed for wheezing or shortness of breath.   Eliquis 5 MG Tabs tablet Generic drug: apixaban Take 5 mg by mouth 2 (two) times daily.   apixaban 2.5 MG Tabs tablet Commonly known as: ELIQUIS Take 2.5 mg by mouth 2 (two) times daily.   azelastine 0.1 % nasal spray Commonly known as: ASTELIN Place 2 sprays into both nostrils 2 (two) times daily as needed.   fluticasone 50 MCG/ACT nasal spray Commonly known as: FLONASE Place 1 spray into both nostrils daily as needed for rhinitis or allergies.   lidocaine 5 % ointment Commonly known as: XYLOCAINE Apply 1 Application topically as  needed.   methocarbamol 500 MG tablet Commonly known as: ROBAXIN Take 1 tablet (500 mg total) by mouth 2 (two) times daily.   mirtazapine 15 MG tablet Commonly known as: REMERON Take 15 mg by mouth at bedtime.   NICOTINE TD Place onto the skin daily.   ondansetron 4 MG disintegrating tablet Commonly known as: ZOFRAN-ODT Take 1 tablet (4 mg total) by mouth every 8 (eight) hours as needed for nausea or vomiting.   pantoprazole 40 MG tablet Commonly known as: PROTONIX Take 1 tablet (40 mg total) by mouth 2 (two) times daily.   Vitamin D3 1.25 MG (50000 UT) Caps Take 50,000 Units by mouth once a week.   VITAMIN D3 COMPLETE PO Take by mouth as needed.        Allergies: No Known Allergies  Past Medical History, Surgical history, Social history, and Family History were reviewed and updated.  Review of Systems: All other 10 point review of systems is negative.   Physical Exam:  vitals were not taken for this visit.   Wt Readings from Last 3 Encounters:  07/10/22 195 lb (88.5 kg)  01/16/22 170 lb (77.1 kg)  01/04/22 170 lb 3.2 oz (77.2 kg)    Ocular: Sclerae unicteric, pupils equal, round and reactive to light Ear-nose-throat: Oropharynx clear, dentition fair Lymphatic: No cervical or supraclavicular adenopathy Lungs no rales or  rhonchi, good excursion bilaterally Heart regular rate and rhythm, no murmur appreciated Abd soft, nontender, positive bowel sounds MSK no focal spinal tenderness, no joint edema Neuro: non-focal, well-oriented, appropriate affect Breasts: Deferred   Lab Results  Component Value Date   WBC 5.6 07/10/2022   HGB 12.9 07/10/2022   HCT 39.2 07/10/2022   MCV 85.0 07/10/2022   PLT 165 07/10/2022   Lab Results  Component Value Date   FERRITIN 24 03/04/2008   IRON 29 (L) 03/04/2008   TIBC 440 03/04/2008   UIBC 411 03/04/2008   IRONPCTSAT 7 (L) 03/04/2008   Lab Results  Component Value Date   RBC 4.61 07/10/2022   No results found  for: "KPAFRELGTCHN", "LAMBDASER", "KAPLAMBRATIO" No results found for: "IGGSERUM", "IGA", "IGMSERUM" No results found for: "TOTALPROTELP", "ALBUMINELP", "A1GS", "A2GS", "BETS", "BETA2SER", "GAMS", "MSPIKE", "SPEI"   Chemistry      Component Value Date/Time   NA 138 07/10/2022 1457   K 4.2 07/10/2022 1457   CL 103 07/10/2022 1457   CO2 29 07/10/2022 1457   BUN 12 07/10/2022 1457   CREATININE 0.87 07/10/2022 1457      Component Value Date/Time   CALCIUM 9.4 07/10/2022 1457   ALKPHOS 50 07/10/2022 1457   AST 14 (L) 07/10/2022 1457   ALT 13 07/10/2022 1457   BILITOT 0.6 07/10/2022 1457       Impression and Plan: Monica Holland is a very pleasant 37 yo African American female with history of multiple recurrent thrombotic events now on life anticoagulation with Eliquis. She also has family history of thrombotic events on maternal and paternal sides.  Hyper coag panel was negative.  She will hold her Eliquis starting 2 days prior to surgery and restart the day after. Patient verbalized understanding.  She will let us know if any other surgery is needed in the future.  No labs needed next month. Keep follow-up in 06/2023.   Eileen Stanford, NP 8/5/202410:47 AM

## 2023-01-07 IMAGING — DX DG CHEST 1V PORT
1 series · 1 of 1 positions shown · non-contrast
Comparison: Radiographs 04/14/2019.

CLINICAL DATA: Arm swelling and shortness of breath. History of
pulmonary embolism with medication noncompliance.

EXAM:
PORTABLE CHEST 1 VIEW

[chest ap]
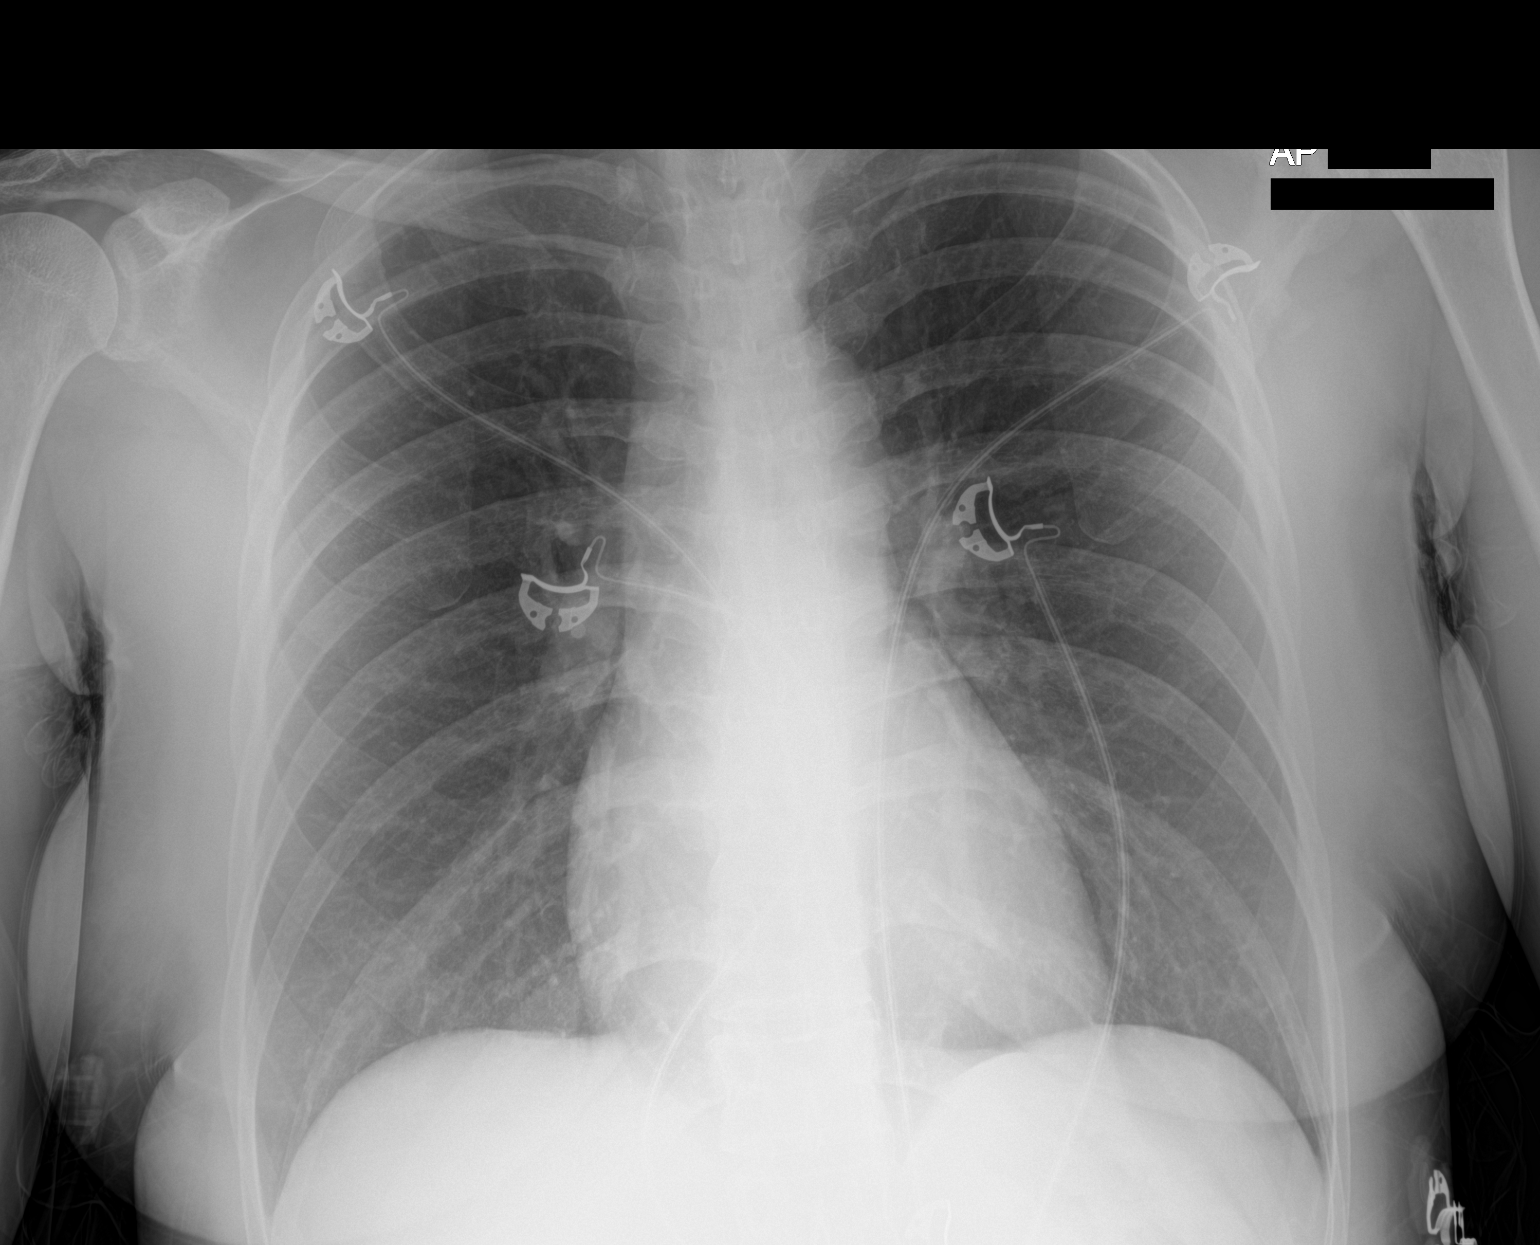

[1 of 1 positions shown; findings below may reference images not displayed]

FINDINGS: 5911 hours. The heart size and mediastinal contours are normal. The
lungs are clear. There is no pleural effusion or pneumothorax. No
acute osseous findings are identified. Telemetry leads overlie the
chest.
IMPRESSION: No active cardiopulmonary process.

## 2023-01-10 ENCOUNTER — Inpatient Hospital Stay: Payer: Medicaid Other

## 2023-04-10 ENCOUNTER — Ambulatory Visit
Admission: EM | Admit: 2023-04-10 | Discharge: 2023-04-10 | Disposition: A | Discharge status: Home / Self Care | Payer: Medicaid Other | Attending: Emergency Medicine | Admitting: Emergency Medicine

## 2023-04-10 DIAGNOSIS — H65192 Other acute nonsuppurative otitis media, left ear: Secondary | ICD-10-CM

## 2023-04-10 MED ORDER — CETIRIZINE HCL 10 MG PO TABS: 10.0000 mg | ORAL_TABLET | Freq: Every day | ORAL | 2 refills | Status: DC

## 2023-04-10 MED ORDER — FLUTICASONE PROPIONATE 50 MCG/ACT NA SUSP: 2.0000 | Freq: Every day | NASAL | 2 refills | Status: AC

## 2023-04-10 MED ORDER — AMOXICILLIN-POT CLAVULANATE 875-125 MG PO TABS: 1.0000 | ORAL_TABLET | Freq: Two times a day (BID) | ORAL | 0 refills | Status: AC

## 2023-04-10 NOTE — ED Triage Notes (Signed)
Pt presents to UC for c/o left ear pain, muffled x1 week. Reports some drainage. Tylenol does not help.

## 2023-04-10 NOTE — Discharge Instructions (Signed)
Augmentin twice daily for 5 days Take with food to avoid upset stomach. This is to treat ear infection  To help with sinus and ear symptoms, take zyrtec daily I also recommend once daily flonase  Please contact ENT for follow up

## 2023-04-10 NOTE — ED Provider Notes (Signed)
[facility]    CSN: [id number] Arrival date & time: 04/10/23  4098      History   Chief Complaint Chief Complaint  Patient presents with   Otalgia    HPI Monica Holland is a 37 y.o. female.  1 week history of left ear pain and muffled hearing Feels some external drainage Some runny nose and congestion No fever Has tried tylenol   Reports recurrent ear infections and sinus issues Would like to see specialist   Past Medical History:  Diagnosis Date   Abnormal uterine bleeding (AUB)    heavy vaginal bleeding   Anticoagulant long-term use    eliquis--- managed by dr Myna Hidalgo (hematologist)   Chronic nausea    per pt intermittant , without vomiting,  had normal EGD 12-29-2021 by dr [name]   Chronic venous insufficiency of lower extremity    evaluted by dr Heath Lark (vascular) 03-15-2021 note in epic, chronic venous insuff LLE causing edema/ post thombotic syndrome   GERD (gastroesophageal reflux disease)    History of pulmonary embolus (PE) 2014   nonocclusive and RLE DVT   History of recurrent deep vein thrombosis (DVT)    LLE in 2004, 2009, 2016, 2021  & 2022 chronic DVT thrombis /  in 2014 , RLE and PE;   followed by dr Myna Hidalgo (hematology)  negative work-up for blood disorder ,  secondary to  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) 08/01/2018   Cigarette smoker 05/03/2018   Chronic venous insufficiency 09/05/2016    Past Surgical History:  Procedure Laterality Date   ANKLE SURGERY Right 2014   DILITATION & CURRETTAGE/HYSTROSCOPY WITH NOVASURE ABLATION N/A 01/04/2022   Procedure: DILATATION & CURETTAGE/HYSTEROSCOPY WITH NOVASURE ABLATION;  Surgeon: Milas Hock, MD;  Location: [facility] [place];   Service: Gynecology;  Laterality: N/A;   ESOPHAGOGASTRODUODENOSCOPY (EGD) WITH PROPOFOL  12/29/2021   by dr [name]   INTRAUTERINE DEVICE (IUD) INSERTION N/A 01/04/2022   Procedure: INTRAUTERINE DEVICE (IUD) INSERTION;  Surgeon: Milas Hock, MD;  Location: [facility] [place];  Service: Gynecology;  Laterality: N/A;    OB History     Gravida  4   Para  4   Term  4   Preterm  0   AB  0   Living  0      SAB  0   IAB  0   Ectopic  0   Multiple  0   Live Births  0            Home Medications    Prior to Admission medications   Medication Sig Start Date End Date Taking? Authorizing Provider  amoxicillin-clavulanate (AUGMENTIN) 875-125 MG tablet Take 1 tablet by mouth every 12 (twelve) hours for 5 days. 04/10/23 04/15/23 Yes Marykate Heuberger, Lurena Joiner, PA-C  cetirizine (ZYRTEC ALLERGY) 10 MG tablet Take 1 tablet (10 mg total) by mouth daily. 04/10/23  Yes Joncarlos Atkison, Lurena Joiner, PA-C  fluticasone (FLONASE) 50 MCG/ACT nasal spray Place 2 sprays into both nostrils daily. 04/10/23  Yes Amauri Keefe, Lurena Joiner, PA-C  albuterol (VENTOLIN HFA) 108 (90 Base) MCG/ACT inhaler Inhale 1-2 puffs into the lungs every 6 (six) hours as needed for wheezing or shortness of breath. 02/08/22   Ellsworth Lennox, PA-C  apixaban (ELIQUIS) 2.5 MG TABS tablet Take 2.5 mg by mouth 2 (two) times daily. Patient not taking: Reported on 11/20/2022 06/27/21   [provider]  apixaban (ELIQUIS) 5 MG TABS tablet Take 5 mg by mouth 2 (two) times daily.    [provider]  azelastine (ASTELIN) 0.1 % nasal spray Place 2 sprays into both nostrils 2 (two) times daily as needed. 08/16/21   [provider]  Cholecalciferol (VITAMIN D3) 1.25 MG (50000 UT) capsule Take 50,000 Units by mouth once a week.    [provider]  diphenhydramine-acetaminophen (TYLENOL PM) 25-500 MG TABS tablet Take 1 tablet by mouth at bedtime as needed.    [provider]  lidocaine (XYLOCAINE) 5 % ointment Apply 1  Application topically as needed. 11/11/22   Raspet, Noberto Retort, PA-C  methocarbamol (ROBAXIN) 500 MG tablet Take 1 tablet (500 mg total) by mouth 2 (two) times daily. 11/11/22   Raspet, Erin K, PA-C  mirtazapine (REMERON) 15 MG tablet Take 15 mg by mouth at bedtime. 11/07/22   [provider]  Multiple Vitamins-Minerals (VITAMIN D3 COMPLETE PO) Take by mouth as needed.    [provider]  NICOTINE TD Place onto the skin daily.    [provider]  ondansetron (ZOFRAN-ODT) 4 MG disintegrating tablet Take 1 tablet (4 mg total) by mouth every 8 (eight) hours as needed for nausea or vomiting. 11/15/21   Unk Lightning, PA  pantoprazole (PROTONIX) 40 MG tablet Take 1 tablet (40 mg total) by mouth 2 (two) times daily. Patient taking differently: Take 40 mg by mouth 2 (two) times daily. 11/15/21   Unk Lightning, PA  Rivaroxaban 15 & 20 MG TBPK Take as directed on package: Start with one 15mg  tablet by mouth twice a day with food. On Day 22, switch to one 20mg  tablet once a day with food. Patient not taking: Reported on 04/02/2020 03/05/13 06/25/20  [name], MD    Family History Family History  Problem Relation Age of Onset   Deep vein thrombosis Mother    Hyperlipidemia Mother    Diabetes Mother    High Cholesterol Mother    Stomach cancer Neg Hx    Colon cancer Neg Hx    Esophageal cancer Neg Hx    Rectal cancer Neg Hx     Social History Social History   Tobacco Use   Smoking status: Every Day    Types: Cigarettes   Smokeless tobacco: Never   Tobacco comments:    12-30-2021  per pt average 1-2 cig per day down from 1/ 2ppd  Vaping Use   Vaping status: Never Used  Substance Use Topics   Alcohol use: Yes    Alcohol/week: 3.0 standard drinks of alcohol    Types: 3 Cans of beer per week   Drug use: Yes    Types: Marijuana    Comment: 01/03/2022     Allergies   Patient has no known allergies.   Review of Systems Review of Systems  HENT:   Positive for ear pain.    Per HPI  Physical Exam Triage Vital Signs ED Triage Vitals  Encounter Vitals Group     BP 04/10/23 0933 (!) 132/92     Systolic BP Percentile --      Diastolic BP Percentile --      Pulse Rate 04/10/23 0933 84     Resp 04/10/23 0933 16     Temp 04/10/23 0933 98.1 F (36.7 C)  Temp Source 04/10/23 0933 Oral     SpO2 04/10/23 0933 98 %     Weight --      Height --      Head Circumference --      Peak Flow --      Pain Score 04/10/23 0929 9     Pain Loc --      Pain Education --      Exclude from Growth Chart --    No data found.  Updated Vital Signs BP (!) 132/92 (BP Location: Right Arm)   Pulse 84   Temp 98.1 F (36.7 C) (Oral)   Resp 16   SpO2 98%    Physical Exam Vitals and nursing note reviewed.  Constitutional:      General: She is not in acute distress.    Appearance: She is not ill-appearing.  HENT:     Left Ear: Tympanic membrane is injected.     Ears:     Comments: Bilat TMs are full appearing. Left is injected. Canals are clear     Nose: Rhinorrhea (mild, clear) present.  Eyes:     Extraocular Movements: Extraocular movements intact.     Pupils: Pupils are equal, round, and reactive to light.  Cardiovascular:     Rate and Rhythm: Normal rate and regular rhythm.  Pulmonary:     Effort: Pulmonary effort is normal.  Musculoskeletal:     Cervical back: Normal range of motion.  Skin:    General: Skin is warm and dry.  Neurological:     Mental Status: She is alert and oriented to person, place, and time.     UC Treatments / Results  Labs (all labs ordered are listed, but only abnormal results are displayed) Labs Reviewed - No data to display  EKG  Radiology No results found.  Procedures Procedures (including critical care time)  Medications Ordered in UC Medications - No data to display  Initial Impression / Assessment and Plan / UC Course  I have reviewed the triage vital signs and the nursing  notes.  Pertinent labs & imaging results that were available during my care of the patient were reviewed by me and considered in my medical decision making (see chart for details).  Augmentin BID x 5 for left otitis media Recommend daily zyrtec and flonase ENT info provided for follow up Work note provided Return precautions discussed. Patient agrees to plan  Final Clinical Impressions(s) / UC Diagnoses   Final diagnoses:  Other non-recurrent acute nonsuppurative otitis media of left ear     Discharge Instructions      Augmentin twice daily for 5 days Take with food to avoid upset stomach. This is to treat ear infection  To help with sinus and ear symptoms, take zyrtec daily I also recommend once daily flonase  Please contact ENT for follow up     ED Prescriptions     Medication Sig Dispense Auth. Provider   amoxicillin-clavulanate (AUGMENTIN) 875-125 MG tablet Take 1 tablet by mouth every 12 (twelve) hours for 5 days. 10 tablet Roniqua Kintz, PA-C   cetirizine (ZYRTEC ALLERGY) 10 MG tablet Take 1 tablet (10 mg total) by mouth daily. 30 tablet Alfonsa Vaile, PA-C   fluticasone (FLONASE) 50 MCG/ACT nasal spray Place 2 sprays into both nostrils daily. 9.9 mL Allyn Bartelson, Lurena Joiner, PA-C      PDMP not reviewed this encounter.   Nyjae Hodge, Lurena Joiner, [place] 04/10/23 1009

## 2023-04-26 ENCOUNTER — Other Ambulatory Visit: Payer: Self-pay | Admitting: Nurse Practitioner

## 2023-04-26 ENCOUNTER — Ambulatory Visit: Payer: Self-pay

## 2023-04-26 DIAGNOSIS — X503XXA Overexertion from repetitive movements, initial encounter: Secondary | ICD-10-CM

## 2023-04-26 DIAGNOSIS — M549 Dorsalgia, unspecified: Secondary | ICD-10-CM

## 2023-05-24 ENCOUNTER — Ambulatory Visit (INDEPENDENT_AMBULATORY_CARE_PROVIDER_SITE_OTHER): Payer: No Typology Code available for payment source | Admitting: Orthopedic Surgery

## 2023-05-24 ENCOUNTER — Other Ambulatory Visit (INDEPENDENT_AMBULATORY_CARE_PROVIDER_SITE_OTHER): Payer: Self-pay

## 2023-05-24 DIAGNOSIS — M545 Low back pain, unspecified: Secondary | ICD-10-CM

## 2023-05-24 DIAGNOSIS — M25512 Pain in left shoulder: Secondary | ICD-10-CM

## 2023-05-25 ENCOUNTER — Telehealth: Payer: Self-pay | Admitting: Orthopedic Surgery

## 2023-05-25 ENCOUNTER — Telehealth: Payer: Self-pay | Admitting: Family

## 2023-05-25 ENCOUNTER — Ambulatory Visit (INDEPENDENT_AMBULATORY_CARE_PROVIDER_SITE_OTHER): Payer: Medicaid Other | Admitting: Family

## 2023-05-25 ENCOUNTER — Other Ambulatory Visit (INDEPENDENT_AMBULATORY_CARE_PROVIDER_SITE_OTHER): Payer: No Typology Code available for payment source

## 2023-05-25 DIAGNOSIS — M25512 Pain in left shoulder: Secondary | ICD-10-CM

## 2023-05-25 MED ORDER — CYCLOBENZAPRINE HCL 10 MG PO TABS
10.0000 mg | ORAL_TABLET | Freq: Three times a day (TID) | ORAL | 0 refills | Status: AC | PRN
Start: 2023-05-25 — End: ?

## 2023-05-25 MED ORDER — HYDROCODONE-ACETAMINOPHEN 5-325 MG PO TABS: 1.0000 | ORAL_TABLET | Freq: Four times a day (QID) | ORAL | 0 refills | Status: DC | PRN

## 2023-05-25 NOTE — Telephone Encounter (Signed)
 Pt worked into the sch this morning to see [place].

## 2023-05-25 NOTE — Progress Notes (Deleted)
 Office Visit Note   Patient: Monica Holland           Date of Birth: 11/09/1985           MRN: [id number] Visit Date: 05/25/2023              Requested by: Emerick Edelman, MD [address] [place],  [place] [address] PCP: Doyle, Natalie, MD   Assessment & Plan: Visit Diagnoses:  1. Acute pain of left shoulder     Plan: ***  Follow-Up Instructions: No follow-ups on file.   Orders:  Orders Placed This Encounter  Procedures   XR Shoulder Left   MR Shoulder Left without Contrast    No orders of the defined types were placed in this encounter.     Procedures: No procedures performed   Clinical Data: No additional findings.   Subjective: Chief Complaint  Patient presents with   Left Shoulder - Pain    HPI  Review of Systems   Objective: Vital Signs: LMP 04/12/2023 (Approximate)   Physical Exam  Ortho Exam  Specialty Comments:  No specialty comments available.  Imaging: No results found.   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) 08/01/2018   Cigarette smoker 05/03/2018   Chronic venous insufficiency 09/05/2016   Past Medical History:  Diagnosis Date   Abnormal uterine bleeding (AUB)    heavy vaginal bleeding   Anticoagulant long-term use    eliquis --- managed by dr timmy (hematologist)   Chronic nausea    per pt intermittant , without vomiting,  had normal EGD 12-29-2021 by dr federico   Chronic venous insufficiency of lower extremity    evaluted by dr debby robertson (vascular) 03-15-2021 note in epic, chronic venous insuff LLE causing edema/ post thombotic syndrome   GERD (gastroesophageal reflux disease)    History of pulmonary embolus (PE) 2014   nonocclusive and RLE DVT   History of recurrent deep vein thrombosis (DVT)    LLE in 2004, 2009, 2016, 2021   & 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   Wears glasses     Family History  Problem Relation Age of Onset   Deep vein thrombosis Mother    Hyperlipidemia Mother    Diabetes Mother    High Cholesterol Mother    Stomach cancer Neg Hx    Colon cancer Neg Hx    Esophageal cancer Neg Hx    Rectal cancer Neg Hx     Past Surgical History:  Procedure Laterality Date   ANKLE SURGERY Right 2014   DILITATION & CURRETTAGE/HYSTROSCOPY WITH NOVASURE ABLATION N/A 01/04/2022   Procedure: DILATATION & CURETTAGE/HYSTEROSCOPY WITH NOVASURE ABLATION;  Surgeon: Cleatus Moccasin, MD;  Location: [facility] [place];  Service: Gynecology;  Laterality: N/A;   ESOPHAGOGASTRODUODENOSCOPY (EGD) WITH PROPOFOL   12/29/2021   by dr federico   INTRAUTERINE DEVICE (IUD) INSERTION N/A 01/04/2022   Procedure: INTRAUTERINE DEVICE (IUD) INSERTION;  Surgeon: Cleatus Moccasin, MD;  Location: [facility] [place];  Service: Gynecology;  Laterality: N/A;   Social History   Occupational History   Occupation: international aid/development worker  Tobacco Use   Smoking status: Every  Day    Types: Cigarettes   Smokeless tobacco: Never   Tobacco comments:    12-30-2021  per pt average 1-2 cig per day down from 1/ 2ppd  Vaping Use   Vaping status: Never Used  Substance and Sexual Activity   Alcohol use: Yes    Alcohol/week: 3.0 standard drinks of alcohol    Types: 3 Cans of beer per week   Drug use: Yes    Types: Marijuana    Comment: 01/03/2022   Sexual activity: Yes    Birth control/protection: None

## 2023-05-25 NOTE — Telephone Encounter (Signed)
 Patient need a out of work note(05/25/23 & 05/28/23) she work at [employer] and does lifting. Per pt note can be sent to her Mychart once completed.

## 2023-05-25 NOTE — Telephone Encounter (Signed)
Letter written and sent to her mychart

## 2023-05-25 NOTE — Telephone Encounter (Signed)
 Pt had an appt for a shot yesterday, pt stated she is having numbness, she is not able to use her arm at all, she can't lift her arm, or even hold her phone in her hand. Pt stated she called the after hours line lastnight and they informed her to sleep in a recliner and sit her fingers in ice. Pt also have symptoms of nausea and severe headache.

## 2023-05-28 ENCOUNTER — Encounter: Payer: Self-pay | Admitting: Orthopedic Surgery

## 2023-05-28 ENCOUNTER — Ambulatory Visit
Admission: RE | Admit: 2023-05-28 | Discharge: 2023-05-28 | Discharge status: Home / Self Care | Payer: Medicaid Other | Source: Ambulatory Visit | Attending: Family

## 2023-05-28 ENCOUNTER — Other Ambulatory Visit: Payer: Medicaid Other

## 2023-05-28 DIAGNOSIS — M25512 Pain in left shoulder: Secondary | ICD-10-CM

## 2023-05-28 NOTE — Progress Notes (Signed)
 Office Visit Note   Patient: IRISH BREISCH           Date of Birth: 06/19/1985           MRN: [id number] Visit Date: 05/24/2023              Requested by: Emerick Edelman, MD [address] [place],  [place] [address] PCP: Emerick Edelman, MD  Chief Complaint  Patient presents with   Left Shoulder - Pain   Lower Back - Pain      HPI: Patient is a 38 year old woman who is seen for initial evaluation for left shoulder acute injury as well as upper back and neck pain.  Patient states that initial injury was on January 3 to the left shoulder and upper back.  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  Patient did have thoracic and lumbar spine x-rays obtained on January 9 which were within normal limits.  Patient states the back pain persists despite treatment with Robaxin  and then Flexeril .  Patient states she has decreased range of motion of the left shoulder.  Assessment & Plan: Visit Diagnoses:  1. Acute pain of left shoulder     Plan: Left shoulder was injected the subacromial space.  At follow-up if patient is still symptomatic would consider an MRI scan.  Patient was provided a note to continue light duty work for 4 weeks.  Recommended trying heat for her upper thoracic back pain.  Follow-Up Instructions: Return in about 4 weeks (around 06/21/2023).   Ortho Exam  Patient is alert, oriented, no adenopathy, well-dressed, normal affect, normal respiratory effort. Examination patient has no radicular symptoms in her upper or lower extremities.  She has pain to palpation over the [facility] joint biceps as well as with internal and external rotation stress to her shoulder.  No focal motor weakness.  Imaging: No results found. No images are attached to the encounter.  Labs: Lab Results  Component Value Date   REPTSTATUS 04/04/2020 FINAL 04/02/2020   CULT MULTIPLE SPECIES PRESENT, SUGGEST RECOLLECTION (A) 04/02/2020     Lab  Results  Component Value Date   ALBUMIN 4.5 11/20/2022   ALBUMIN 4.5 07/10/2022   ALBUMIN 4.5 01/16/2022    No results found for: MG No results found for: VD25OH  No results found for: PREALBUMIN    Latest Ref Rng & Units 11/20/2022   10:38 AM 07/10/2022    2:57 PM 01/16/2022    1:59 PM  CBC EXTENDED  WBC 4.0 - 10.5 K/uL 3.1  5.6  4.2   RBC 3.87 - 5.11 MIL/uL 5.31  4.61  4.80   Hemoglobin 12.0 - 15.0 g/dL 85.0  87.0  86.6   HCT 36.0 - 46.0 % 44.9  39.2  40.1   Platelets 150 - 400 K/uL 233  165  196   NEUT# 1.7 - 7.7 K/uL 1.3  2.8    Lymph# 0.7 - 4.0 K/uL 1.5  2.1       There is no height or weight on file to calculate BMI.  Orders:  Orders Placed This Encounter  Procedures   XR Shoulder Left   No orders of the defined types were placed in this encounter.    Procedures: No procedures performed  Clinical Data: No additional findings.  ROS:  All other systems negative, except as noted in the HPI. Review of Systems  Objective: Vital Signs: LMP 04/12/2023 (Approximate)  Specialty Comments:  No specialty comments available.  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) 08/01/2018   Cigarette smoker 05/03/2018   Chronic venous insufficiency 09/05/2016   Past Medical History:  Diagnosis Date   Abnormal uterine bleeding (AUB)    heavy vaginal bleeding   Anticoagulant long-term use    eliquis --- managed by dr timmy (hematologist)   Chronic nausea    per pt intermittant , without vomiting,  had normal EGD 12-29-2021 by dr federico   Chronic venous insufficiency of lower extremity    evaluted by dr debby robertson (vascular) 03-15-2021 note in epic, chronic venous insuff LLE causing edema/ post thombotic syndrome   GERD (gastroesophageal reflux disease)    History of pulmonary  embolus (PE) 2014   nonocclusive and RLE DVT   History of recurrent deep vein thrombosis (DVT)    LLE in 2004, 2009, 2016, 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   Wears glasses     Family History  Problem Relation Age of Onset   Deep vein thrombosis Mother    Hyperlipidemia Mother    Diabetes Mother    High Cholesterol Mother    Stomach cancer Neg Hx    Colon cancer Neg Hx    Esophageal cancer Neg Hx    Rectal cancer Neg Hx     Past Surgical History:  Procedure Laterality Date   ANKLE SURGERY Right 2014   DILITATION & CURRETTAGE/HYSTROSCOPY WITH NOVASURE ABLATION N/A 01/04/2022   Procedure: DILATATION & CURETTAGE/HYSTEROSCOPY WITH NOVASURE ABLATION;  Surgeon: Cleatus Moccasin, MD;  Location: [facility] [place];  Service: Gynecology;  Laterality: N/A;   ESOPHAGOGASTRODUODENOSCOPY (EGD) WITH PROPOFOL   12/29/2021   by dr federico   INTRAUTERINE DEVICE (IUD) INSERTION N/A 01/04/2022   Procedure: INTRAUTERINE DEVICE (IUD) INSERTION;  Surgeon: Cleatus Moccasin, MD;  Location: [facility];  Service: Gynecology;  Laterality: N/A;   Social History   Occupational History   Occupation: international aid/development worker  Tobacco Use   Smoking status: Every Day    Types: Cigarettes   Smokeless tobacco: Never   Tobacco comments:    12-30-2021  per pt average 1-2 cig per day down from 1/ 2ppd  Vaping Use   Vaping status: Never Used  Substance and Sexual Activity   Alcohol use: Yes    Alcohol/week: 3.0 standard drinks of alcohol    Types: 3 Cans of beer per week   Drug use: Yes    Types: Marijuana    Comment: 01/03/2022   Sexual activity: Yes    Birth control/protection: None

## 2023-05-29 ENCOUNTER — Encounter: Payer: Self-pay | Admitting: Family

## 2023-05-29 NOTE — Progress Notes (Signed)
 Office Visit Note   Patient: Monica Holland           Date of Birth: 1985/10/27           MRN: [id number] Visit Date: 05/25/2023              Requested by: Emerick Edelman, MD [address] [place],  [place] [address] PCP: Doyle, Natalie, MD  Chief Complaint  Patient presents with   Left Shoulder - Pain      HPI: The patient is a 38 year old woman who presents today as a work in concern for worsening of her left shoulder pain.  She was seen yesterday for her left shoulder pain and did receive a Depo-Medrol injection in subacromial space.  The time she has been having worsening shoulder pain with some numbness and shooting pain down her arm no symptoms in the hand no weakness.  Complaining of new left-sided neck pain.  Reports the pain started after she had a nap after our visit to her our office yesterday.  She was having difficulty sleeping last night due to pain.  Reports she is unable to take anti-inflammatory medications has tried Robaxin  without relief  Patient and family requesting repeat radiographs  Assessment & Plan: Visit Diagnoses:  1. Acute pain of left shoulder     Plan: Repeat radiographs of the shoulder were reassuring. Offered muscle relaxer and pain medication. Resassuance provided. Discussed typical course. Will proceed with mri left shouder   Sling for comfort  Follow-Up Instructions: No follow-ups on file.   Right Hand Exam   Muscle Strength  Grip: 5/5    Left Hand Exam   Muscle Strength  Grip:  5/5    Left Shoulder Exam   Tenderness  Left shoulder tenderness location: global.  Tests  Impingement: positive  Other  Erythema: absent Sensation: normal Pulse: present       Patient is alert, oriented, no adenopathy, well-dressed, normal affect, normal respiratory effort. Left trapezius tender  Imaging: No results found. No images are attached to the encounter.  Labs: Lab Results  Component Value Date   REPTSTATUS 04/04/2020  FINAL 04/02/2020   CULT MULTIPLE SPECIES PRESENT, SUGGEST RECOLLECTION (A) 04/02/2020     Lab Results  Component Value Date   ALBUMIN 4.5 11/20/2022   ALBUMIN 4.5 07/10/2022   ALBUMIN 4.5 01/16/2022    No results found for: MG No results found for: VD25OH  No results found for: PREALBUMIN    Latest Ref Rng & Units 11/20/2022   10:38 AM 07/10/2022    2:57 PM 01/16/2022    1:59 PM  CBC EXTENDED  WBC 4.0 - 10.5 K/uL 3.1  5.6  4.2   RBC 3.87 - 5.11 MIL/uL 5.31  4.61  4.80   Hemoglobin 12.0 - 15.0 g/dL 85.0  87.0  86.6   HCT 36.0 - 46.0 % 44.9  39.2  40.1   Platelets 150 - 400 K/uL 233  165  196   NEUT# 1.7 - 7.7 K/uL 1.3  2.8    Lymph# 0.7 - 4.0 K/uL 1.5  2.1       There is no height or weight on file to calculate BMI.  Orders:  Orders Placed This Encounter  Procedures   XR Shoulder Left   MR Shoulder Left without Contrast   Meds ordered this encounter  Medications   cyclobenzaprine  (FLEXERIL ) 10 MG tablet    Sig: Take 1 tablet (10 mg total) by mouth 3 (three) times daily as  needed for muscle spasms.    Dispense:  30 tablet    Refill:  0   HYDROcodone -acetaminophen  (NORCO/VICODIN) 5-325 MG tablet    Sig: Take 1 tablet by mouth every 6 (six) hours as needed for moderate pain (pain score 4-6).    Dispense:  20 tablet    Refill:  0     Procedures: No procedures performed  Clinical Data: No additional findings.  ROS:  All other systems negative, except as noted in the HPI. Review of Systems  Objective: Vital Signs: LMP 04/12/2023 (Approximate)   Specialty Comments:  No specialty comments available.  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) 08/01/2018   Cigarette smoker 05/03/2018   Chronic venous insufficiency 09/05/2016   Past Medical History:  Diagnosis Date    Abnormal uterine bleeding (AUB)    heavy vaginal bleeding   Anticoagulant long-term use    eliquis --- managed by dr timmy (hematologist)   Chronic nausea    per pt intermittant , without vomiting,  had normal EGD 12-29-2021 by dr federico   Chronic venous insufficiency of lower extremity    evaluted by dr debby robertson (vascular) 03-15-2021 note in epic, 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   Wears glasses     Family History  Problem Relation Age of Onset   Deep vein thrombosis Mother    Hyperlipidemia Mother    Diabetes Mother    High Cholesterol Mother    Stomach cancer Neg Hx    Colon cancer Neg Hx    Esophageal cancer Neg Hx    Rectal cancer Neg Hx     Past Surgical History:  Procedure Laterality Date   ANKLE SURGERY Right 2014   DILITATION & CURRETTAGE/HYSTROSCOPY WITH NOVASURE ABLATION N/A 01/04/2022   Procedure: DILATATION & CURETTAGE/HYSTEROSCOPY WITH NOVASURE ABLATION;  Surgeon: Cleatus Moccasin, MD;  Location: [facility] [place];  Service: Gynecology;  Laterality: N/A;   ESOPHAGOGASTRODUODENOSCOPY (EGD) WITH PROPOFOL   12/29/2021   by dr federico   INTRAUTERINE DEVICE (IUD) INSERTION N/A 01/04/2022   Procedure: INTRAUTERINE DEVICE (IUD) INSERTION;  Surgeon: Cleatus Moccasin, MD;  Location: [facility];  Service: Gynecology;  Laterality: N/A;   Social History   Occupational History   Occupation: international aid/development worker  Tobacco Use   Smoking status: Every Day    Types: Cigarettes   Smokeless tobacco: Never   Tobacco comments:    12-30-2021  per pt  average 1-2 cig per day down from 1/ 2ppd  Vaping Use   Vaping status: Never Used  Substance and Sexual Activity   Alcohol use: Yes    Alcohol/week: 3.0 standard drinks of alcohol    Types: 3 Cans of beer per week   Drug use: Yes    Types: Marijuana    Comment: 01/03/2022   Sexual activity: Yes    Birth control/protection:  None

## 2023-06-04 ENCOUNTER — Ambulatory Visit
Admission: EM | Admit: 2023-06-04 | Discharge: 2023-06-04 | Disposition: A | Discharge status: Home / Self Care | Payer: Medicaid Other | Attending: Family Medicine | Admitting: Family Medicine

## 2023-06-04 ENCOUNTER — Ambulatory Visit (HOSPITAL_BASED_OUTPATIENT_CLINIC_OR_DEPARTMENT_OTHER): Payer: Medicaid Other | Admitting: Student

## 2023-06-04 ENCOUNTER — Other Ambulatory Visit: Payer: Medicaid Other

## 2023-06-04 ENCOUNTER — Encounter (HOSPITAL_BASED_OUTPATIENT_CLINIC_OR_DEPARTMENT_OTHER): Payer: Self-pay

## 2023-06-04 ENCOUNTER — Telehealth: Payer: Self-pay | Admitting: Orthopedic Surgery

## 2023-06-04 NOTE — ED Triage Notes (Signed)
Pt said at registation this visit will be follow under her personal insurance, no workers comp.   Pt was seeing already by workers comp and referred to Orthopedic for follow up.

## 2023-06-04 NOTE — ED Notes (Signed)
Per Lennox Laity: Patient was referred back to Workers Comp for back related injury.

## 2023-06-04 NOTE — ED Provider Notes (Signed)
  [facility]    CSN: [id number] Arrival date & time: 06/04/23  1032   HPI Monica Holland is a 38 y.o. female presents for back pain. Pt has an open workers comp claim for this and has been seen and referred to ortho. Patient advised by nursing staff that she needs to follow up with her works Designer, industrial/product or she can go to our works comp clinic for further options. Pt verbalized understanding.    Radford Pax, NP 06/04/23 1104

## 2023-06-04 NOTE — ED Triage Notes (Addendum)
Patient presents with lower back pain x 2 weeks. Patient is workers comp injury. Patient has orthopedic appt in March. Patient states medication has not help her symptoms.  Pt is on light duty at work.

## 2023-06-04 NOTE — Telephone Encounter (Signed)
Pt walked into office requesting records to be sent [facility]. Pt also stated she was told Friday 2/14 that pain patches were going to be sent in. Pt is now requesting a phone call back or pain patches as requested

## 2023-06-05 MED ORDER — LIDOCAINE 5 % EX PTCH: 1.0000 | MEDICATED_PATCH | CUTANEOUS | 0 refills | Status: DC

## 2023-06-05 NOTE — Addendum Note (Signed)
Addended by: Adonis Huguenin on: 06/05/2023 09:39 AM   Modules accepted: Orders

## 2023-06-05 NOTE — Telephone Encounter (Signed)
 sent

## 2023-06-07 NOTE — Telephone Encounter (Signed)
IC spoke with patient, advised will need to come in to sign authorization for medical records to be released.

## 2023-06-11 ENCOUNTER — Ambulatory Visit (INDEPENDENT_AMBULATORY_CARE_PROVIDER_SITE_OTHER): Payer: Medicaid Other | Admitting: Orthopedic Surgery

## 2023-06-11 DIAGNOSIS — M25512 Pain in left shoulder: Secondary | ICD-10-CM | POA: Diagnosis not present

## 2023-06-11 MED ORDER — PREDNISONE 10 MG PO TABS: 10.0000 mg | ORAL_TABLET | Freq: Every day | ORAL | 0 refills | Status: DC

## 2023-06-12 ENCOUNTER — Other Ambulatory Visit: Payer: Self-pay | Admitting: Family

## 2023-06-12 ENCOUNTER — Encounter: Payer: Self-pay | Admitting: Orthopedic Surgery

## 2023-06-12 DIAGNOSIS — I82512 Chronic embolism and thrombosis of left femoral vein: Secondary | ICD-10-CM

## 2023-06-12 DIAGNOSIS — D6859 Other primary thrombophilia: Secondary | ICD-10-CM

## 2023-06-12 MED ORDER — APIXABAN 5 MG PO TABS: 5.0000 mg | ORAL_TABLET | Freq: Two times a day (BID) | ORAL | 3 refills | Status: DC

## 2023-06-12 NOTE — Progress Notes (Signed)
 Office Visit Note   Patient: Monica Holland           Date of Birth: 03-03-1986           MRN: [id number] Visit Date: 06/11/2023              Requested by: Ellene Route, MD [address] [place],  [place] [address] PCP: Ellene Route, MD  Chief Complaint  Patient presents with   Left Shoulder - Pain      HPI: Patient is a 38 year old woman who is seen in follow-up for left shoulder pain status post MRI scan February 20.  Patient is currently going to [facility] for physical therapy for her right shoulder.  Patient is on Eliquis and not on anti-inflammatories.  Patient has pain primarily with scapulothoracic motion on the left.  Assessment & Plan: Visit Diagnoses:  1. Acute pain of left shoulder     Plan: Patient is provided a prescription to proceed with therapy for the left shoulder at [facility] including scapular stabilization and left shoulder strengthening.  Prescription was provided for prednisone 10 mg a day for anti-inflammatory.  Follow-Up Instructions: Return in about 2 weeks (around 06/25/2023).   Ortho Exam  Patient is alert, oriented, no adenopathy, well-dressed, normal affect, normal respiratory effort. Patient has pain with scapulothoracic motion.  She has pain to palpation of the biceps tendon she has pain with internal extra rotation of the left shoulder.  Review of the MRI scan shows mild tendinosis of the rotator cuff with small interstitial tear.  There is also swelling and mild arthropathy at the [facility] joint.  Imaging: No results found. No images are attached to the encounter.  Labs: Lab Results  Component Value Date   REPTSTATUS 04/04/2020 FINAL 04/02/2020   CULT MULTIPLE SPECIES PRESENT, SUGGEST RECOLLECTION (A) 04/02/2020     Lab Results  Component Value Date   ALBUMIN 4.5 11/20/2022   ALBUMIN 4.5 07/10/2022   ALBUMIN 4.5 01/16/2022    No results found for: "MG" No results found for: "VD25OH"  No results found for:  "PREALBUMIN"    Latest Ref Rng & Units 11/20/2022   10:38 AM 07/10/2022    2:57 PM 01/16/2022    1:59 PM  CBC EXTENDED  WBC 4.0 - 10.5 K/uL 3.1  5.6  4.2   RBC 3.87 - 5.11 MIL/uL 5.31  4.61  4.80   Hemoglobin 12.0 - 15.0 g/dL 62.1  30.8  65.7   HCT 36.0 - 46.0 % 44.9  39.2  40.1   Platelets 150 - 400 K/uL 233  165  196   NEUT# 1.7 - 7.7 K/uL 1.3  2.8    Lymph# 0.7 - 4.0 K/uL 1.5  2.1       There is no height or weight on file to calculate BMI.  Orders:  No orders of the defined types were placed in this encounter.  Meds ordered this encounter  Medications   predniSONE (DELTASONE) 10 MG tablet    Sig: Take 1 tablet (10 mg total) by mouth daily with breakfast.    Dispense:  30 tablet    Refill:  0     Procedures: No procedures performed  Clinical Data: No additional findings.  ROS:  All other systems negative, except as noted in the HPI. Review of Systems  Objective: Vital Signs: There were no vitals taken for this visit.  Specialty Comments:  No specialty comments available.  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) 08/01/2018   Cigarette smoker 05/03/2018   Chronic venous insufficiency 09/05/2016   Past Medical History:  Diagnosis Date   Abnormal uterine bleeding (AUB)    heavy vaginal bleeding   Anticoagulant long-term use    eliquis--- managed by dr Myna Hidalgo (hematologist)   Chronic nausea    per pt intermittant , without vomiting,  had normal EGD 12-29-2021 by dr [name]   Chronic venous insufficiency of lower extremity    evaluted by dr Heath Lark (vascular) 03-15-2021 note in epic, chronic venous insuff LLE causing edema/ post thombotic syndrome   GERD (gastroesophageal reflux disease)    History of pulmonary embolus (PE) 2014   nonocclusive and RLE DVT   History of  recurrent deep vein thrombosis (DVT)    LLE in 2004, 2009, 2016, 2021  & 2022 chronic DVT thrombis /  in 2014 , RLE and PE;   followed by dr Myna Hidalgo (hematology)  negative work-up for blood disorder ,  secondary to  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   Wears glasses     Family History  Problem Relation Age of Onset   Deep vein thrombosis Mother    Hyperlipidemia Mother    Diabetes Mother    High Cholesterol Mother    Stomach cancer Neg Hx    Colon cancer Neg Hx    Esophageal cancer Neg Hx    Rectal cancer Neg Hx     Past Surgical History:  Procedure Laterality Date   ANKLE SURGERY Right 2014   DILITATION & CURRETTAGE/HYSTROSCOPY WITH NOVASURE ABLATION N/A 01/04/2022   Procedure: DILATATION & CURETTAGE/HYSTEROSCOPY WITH NOVASURE ABLATION;  Surgeon: Milas Hock, MD;  Location: [facility] [place];  Service: Gynecology;  Laterality: N/A;   ESOPHAGOGASTRODUODENOSCOPY (EGD) WITH PROPOFOL  12/29/2021   by dr [name]   INTRAUTERINE DEVICE (IUD) INSERTION N/A 01/04/2022   Procedure: INTRAUTERINE DEVICE (IUD) INSERTION;  Surgeon: Milas Hock, MD;  Location: [facility];  Service: Gynecology;  Laterality: N/A;   Social History   Occupational History   Occupation: International aid/development worker  Tobacco Use   Smoking status: Every Day    Types: Cigarettes   Smokeless tobacco: Never   Tobacco comments:    12-30-2021  per pt average 1-2 cig per day down from 1/ 2ppd  Vaping Use   Vaping status: Never Used  Substance and Sexual Activity   Alcohol use: Yes    Alcohol/week: 3.0 standard drinks of alcohol    Types: 3 Cans of beer per week   Drug use: Yes    Types: Marijuana    Comment: 01/03/2022   Sexual activity: Yes    Birth control/protection: None

## 2023-06-13 ENCOUNTER — Other Ambulatory Visit: Payer: Self-pay | Admitting: Family

## 2023-06-13 MED ORDER — HYDROCODONE-ACETAMINOPHEN 5-325 MG PO TABS: 1.0000 | ORAL_TABLET | Freq: Three times a day (TID) | ORAL | 0 refills | Status: DC | PRN

## 2023-06-14 ENCOUNTER — Telehealth: Payer: Self-pay | Admitting: Orthopedic Surgery

## 2023-06-14 NOTE — Telephone Encounter (Signed)
 [place], Monica Holland  Forrest, Monica Holland, RMA; Javier Glazier Monica and [place]  The office work note from visit on 05/25/23 states Ms. Ranker can return to work on 05/29/23. The question being ask by [facility] contact Monica Holland with [employer] is this for full duty or partial duty since Ms. Gedney has a follow up appointment scheduled with Dr. Lajoyce Corners, MD on June 25, 2023. Does she have any restrictions that her job needs to be aware of.  Thx Monica   Please advise full duty or partial duty work.  She was a shoulder injury, currently in PT at [facility].

## 2023-06-15 NOTE — Telephone Encounter (Signed)
 Info sent to [facility]

## 2023-06-18 ENCOUNTER — Telehealth: Payer: Self-pay | Admitting: Orthopedic Surgery

## 2023-06-18 NOTE — Telephone Encounter (Signed)
 You saw this pt on 06/11/2023. Did you want her to continue out of work?

## 2023-06-18 NOTE — Telephone Encounter (Signed)
 Patient called. Her WC CM needs the work note. Also needs the referral sent for [facility]. Her cb# [phone]

## 2023-06-19 NOTE — Telephone Encounter (Signed)
 I can write the letter please see below. But there was a lot of back and forth on return to work stay out of work and the pt states that the case manager is asking for this. I will be happy to write her out from the time of her appt with Dr. Lajoyce Corners until her f/u 06/25/2023 I just wanted to run it by you first.

## 2023-06-20 ENCOUNTER — Other Ambulatory Visit: Payer: Self-pay

## 2023-06-20 NOTE — Telephone Encounter (Signed)
 Ok note is in chart do you send that to work comp? Thank you for your help with this.

## 2023-06-21 ENCOUNTER — Ambulatory Visit: Payer: Medicaid Other | Admitting: Orthopedic Surgery

## 2023-06-25 ENCOUNTER — Ambulatory Visit (INDEPENDENT_AMBULATORY_CARE_PROVIDER_SITE_OTHER): Payer: Medicaid Other | Admitting: Orthopedic Surgery

## 2023-06-25 ENCOUNTER — Encounter: Payer: Self-pay | Admitting: Orthopedic Surgery

## 2023-06-25 DIAGNOSIS — M545 Low back pain, unspecified: Secondary | ICD-10-CM | POA: Diagnosis not present

## 2023-06-25 DIAGNOSIS — M25512 Pain in left shoulder: Secondary | ICD-10-CM | POA: Diagnosis not present

## 2023-06-25 NOTE — Progress Notes (Signed)
 Office Visit Note   Patient: Monica Holland           Date of Birth: 03/19/1986           MRN: [id number] Visit Date: 06/25/2023              Requested by: Ellene Route, MD [address] [place],  [place] [address] PCP: Ellene Route, MD  Chief Complaint  Patient presents with   Left Shoulder - Follow-up      HPI: Patient is a 38 year old woman who presents in follow-up for left shoulder pain status post work-related injury.  Patient states that physical therapy did not start working on the left shoulder secondary to authorization not obtained yet from [employer]  Patient is still out of work.  Assessment & Plan: Visit Diagnoses:  1. Acute pain of left shoulder   2. Acute midline low back pain without sciatica     Plan: Prescription was provided to continue out of work for 4 weeks.  Patient will need to start physical therapy on the left shoulder.  Follow-Up Instructions: Return in about 4 weeks (around 07/23/2023).   Ortho Exam  Patient is alert, oriented, no adenopathy, well-dressed, normal affect, normal respiratory effort. Patient has decreased range of motion and pain with range of motion of the left shoulder.  Pain radiating to the midline of her back.  Imaging: No results found. No images are attached to the encounter.  Labs: Lab Results  Component Value Date   REPTSTATUS 04/04/2020 FINAL 04/02/2020   CULT MULTIPLE SPECIES PRESENT, SUGGEST RECOLLECTION (A) 04/02/2020     Lab Results  Component Value Date   ALBUMIN 4.5 11/20/2022   ALBUMIN 4.5 07/10/2022   ALBUMIN 4.5 01/16/2022    No results found for: "MG" No results found for: "VD25OH"  No results found for: "PREALBUMIN"    Latest Ref Rng & Units 11/20/2022   10:38 AM 07/10/2022    2:57 PM 01/16/2022    1:59 PM  CBC EXTENDED  WBC 4.0 - 10.5 K/uL 3.1  5.6  4.2   RBC 3.87 - 5.11 MIL/uL 5.31  4.61  4.80   Hemoglobin 12.0 - 15.0 g/dL 62.7  03.5  00.9   HCT 36.0 - 46.0 % 44.9  39.2   40.1   Platelets 150 - 400 K/uL 233  165  196   NEUT# 1.7 - 7.7 K/uL 1.3  2.8    Lymph# 0.7 - 4.0 K/uL 1.5  2.1       There is no height or weight on file to calculate BMI.  Orders:  No orders of the defined types were placed in this encounter.  No orders of the defined types were placed in this encounter.    Procedures: No procedures performed  Clinical Data: No additional findings.  ROS:  All other systems negative, except as noted in the HPI. Review of Systems  Objective: Vital Signs: There were no vitals taken for this visit.  Specialty Comments:  No specialty comments available.  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) 08/01/2018   Cigarette smoker 05/03/2018   Chronic venous insufficiency 09/05/2016   Past Medical History:  Diagnosis Date   Abnormal uterine bleeding (AUB)    heavy vaginal bleeding   Anticoagulant long-term use    eliquis--- managed  by dr Myna Hidalgo (hematologist)   Chronic nausea    per pt intermittant , without vomiting,  had normal EGD 12-29-2021 by dr [name]   Chronic venous insufficiency of lower extremity    evaluted by dr Heath Lark (vascular) 03-15-2021 note in epic, chronic venous insuff LLE causing edema/ post thombotic syndrome   GERD (gastroesophageal reflux disease)    History of pulmonary embolus (PE) 2014   nonocclusive and RLE DVT   History of recurrent deep vein thrombosis (DVT)    LLE in 2004, 2009, 2016, 2021  & 2022 chronic DVT thrombis /  in 2014 , RLE and PE;   followed by dr Myna Hidalgo (hematology)  negative work-up for blood disorder ,  secondary to  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   Wears glasses     Family History  Problem Relation Age of Onset   Deep vein thrombosis Mother    Hyperlipidemia Mother     Diabetes Mother    High Cholesterol Mother    Stomach cancer Neg Hx    Colon cancer Neg Hx    Esophageal cancer Neg Hx    Rectal cancer Neg Hx     Past Surgical History:  Procedure Laterality Date   ANKLE SURGERY Right 2014   DILITATION & CURRETTAGE/HYSTROSCOPY WITH NOVASURE ABLATION N/A 01/04/2022   Procedure: DILATATION & CURETTAGE/HYSTEROSCOPY WITH NOVASURE ABLATION;  Surgeon: Milas Hock, MD;  Location: [facility];  Service: Gynecology;  Laterality: N/A;   ESOPHAGOGASTRODUODENOSCOPY (EGD) WITH PROPOFOL  12/29/2021   by dr [name]   INTRAUTERINE DEVICE (IUD) INSERTION N/A 01/04/2022   Procedure: INTRAUTERINE DEVICE (IUD) INSERTION;  Surgeon: Milas Hock, MD;  Location: [facility];  Service: Gynecology;  Laterality: N/A;   Social History   Occupational History   Occupation: International aid/development worker  Tobacco Use   Smoking status: Every Day    Types: Cigarettes   Smokeless tobacco: Never   Tobacco comments:    12-30-2021  per pt average 1-2 cig per day down from 1/ 2ppd  Vaping Use   Vaping status: Never Used  Substance and Sexual Activity   Alcohol use: Yes    Alcohol/week: 3.0 standard drinks of alcohol    Types: 3 Cans of beer per week   Drug use: Yes    Types: Marijuana    Comment: 01/03/2022   Sexual activity: Yes    Birth control/protection: None

## 2023-06-28 ENCOUNTER — Telehealth: Payer: Self-pay | Admitting: Orthopedic Surgery

## 2023-06-28 ENCOUNTER — Other Ambulatory Visit: Payer: Self-pay

## 2023-06-28 DIAGNOSIS — M25512 Pain in left shoulder: Secondary | ICD-10-CM

## 2023-06-28 NOTE — Telephone Encounter (Signed)
 Faxed referral for eval & treat Lt Shoulder to Work Comp [employer]  [phone] for approval. Claim number [id number]

## 2023-07-02 ENCOUNTER — Ambulatory Visit (HOSPITAL_COMMUNITY)
Admission: RE | Admit: 2023-07-02 | Discharge: 2023-07-02 | Disposition: A | Discharge status: Home / Self Care | Source: Ambulatory Visit | Attending: Cardiology | Admitting: Cardiology

## 2023-07-02 ENCOUNTER — Telehealth: Payer: Self-pay | Admitting: Orthopedic Surgery

## 2023-07-02 ENCOUNTER — Other Ambulatory Visit (HOSPITAL_COMMUNITY): Payer: Self-pay | Admitting: Medical

## 2023-07-02 DIAGNOSIS — R52 Pain, unspecified: Secondary | ICD-10-CM

## 2023-07-02 NOTE — Telephone Encounter (Signed)
 Workers comp needs work Advertising copywriter per [employer]. I have written this and available in chart. Tisha informed.

## 2023-07-10 ENCOUNTER — Inpatient Hospital Stay: Payer: Medicaid Other

## 2023-07-10 ENCOUNTER — Inpatient Hospital Stay: Payer: Medicaid Other | Admitting: Family

## 2023-07-13 ENCOUNTER — Other Ambulatory Visit: Payer: Self-pay | Admitting: Family

## 2023-07-13 DIAGNOSIS — I82512 Chronic embolism and thrombosis of left femoral vein: Secondary | ICD-10-CM

## 2023-07-13 DIAGNOSIS — D6859 Other primary thrombophilia: Secondary | ICD-10-CM

## 2023-07-16 ENCOUNTER — Inpatient Hospital Stay

## 2023-07-16 ENCOUNTER — Inpatient Hospital Stay: Admitting: Family

## 2023-07-17 ENCOUNTER — Inpatient Hospital Stay

## 2023-07-17 ENCOUNTER — Inpatient Hospital Stay: Admitting: Family

## 2023-07-23 ENCOUNTER — Encounter: Payer: Self-pay | Admitting: Orthopedic Surgery

## 2023-07-23 ENCOUNTER — Ambulatory Visit: Admitting: Orthopedic Surgery

## 2023-07-23 DIAGNOSIS — M25512 Pain in left shoulder: Secondary | ICD-10-CM

## 2023-07-23 NOTE — Progress Notes (Signed)
 Office Visit Note   Patient: Monica Holland           Date of Birth: [birth year]           MRN: [id number] Visit Date: 07/23/2023              Requested by: Ellene Route, MD [address] [place],  [place] [address] PCP: Ellene Route, MD  Chief Complaint  Patient presents with   Left Shoulder - Follow-up      HPI: Patient is a 38 year old woman who is seen in follow-up for her left shoulder pain.  Patient states the pain seems to be getting worse she has decreased range of motion.  She states she was just approved for 8 visits with therapy she has not set this up yet.  Assessment & Plan: Visit Diagnoses:  1. Acute pain of left shoulder     Plan: Patient was provided a note to be out of work for 4 weeks.  She will follow-up with therapy and we will evaluate how she is doing after therapy.  Follow-Up Instructions: Return in about 4 weeks (around 08/20/2023).   Ortho Exam  Patient is alert, oriented, no adenopathy, well-dressed, normal affect, normal respiratory effort. Examination patient has active abduction and flexion to 70 degrees of the left shoulder.  She has full passive range of motion and pain with internal and external rotation.  The biceps tendon is tender to palpation.  Her MRI scan did show mild tendinosis with mild arthropathy.  Imaging: No results found. No images are attached to the encounter.  Labs: Lab Results  Component Value Date   REPTSTATUS 04/04/2020 FINAL 04/02/2020   CULT MULTIPLE SPECIES PRESENT, SUGGEST RECOLLECTION (A) 04/02/2020     Lab Results  Component Value Date   ALBUMIN 4.5 11/20/2022   ALBUMIN 4.5 07/10/2022   ALBUMIN 4.5 01/16/2022    No results found for: "MG" No results found for: "VD25OH"  No results found for: "PREALBUMIN"    Latest Ref Rng & Units 11/20/2022   10:38 AM 07/10/2022    2:57 PM 01/16/2022    1:59 PM  CBC EXTENDED  WBC 4.0 - 10.5 K/uL 3.1  5.6  4.2   RBC 3.87 - 5.11 MIL/uL 5.31  4.61  4.80    Hemoglobin 12.0 - 15.0 g/dL 19.1  47.8  29.5   HCT 36.0 - 46.0 % 44.9  39.2  40.1   Platelets 150 - 400 K/uL 233  165  196   NEUT# 1.7 - 7.7 K/uL 1.3  2.8    Lymph# 0.7 - 4.0 K/uL 1.5  2.1       There is no height or weight on file to calculate BMI.  Orders:  No orders of the defined types were placed in this encounter.  No orders of the defined types were placed in this encounter.    Procedures: No procedures performed  Clinical Data: No additional findings.  ROS:  All other systems negative, except as noted in the HPI. Review of Systems  Objective: Vital Signs: There were no vitals taken for this visit.  Specialty Comments:  No specialty comments available.  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) 08/01/2018   Cigarette smoker 05/03/2018   Chronic venous insufficiency 09/05/2016   Past Medical History:  Diagnosis Date  Abnormal uterine bleeding (AUB)    heavy vaginal bleeding   Anticoagulant long-term use    eliquis--- managed by dr Myna Hidalgo (hematologist)   Chronic nausea    per pt intermittant , without vomiting,  had normal EGD 12-29-2021 by dr [name]   Chronic venous insufficiency of lower extremity    evaluted by dr Heath Lark (vascular) 03-15-2021 note in epic, chronic venous insuff LLE causing edema/ post thombotic syndrome   GERD (gastroesophageal reflux disease)    History of pulmonary embolus (PE) 2014   nonocclusive and RLE DVT   History of recurrent deep vein thrombosis (DVT)    LLE in 2004, 2009, 2016, 2021  & 2022 chronic DVT thrombis /  in 2014 , RLE and PE;   followed by dr Myna Hidalgo (hematology)  negative work-up for blood disorder ,  secondary to  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   Wears glasses     Family History  Problem Relation  Age of Onset   Deep vein thrombosis Mother    Hyperlipidemia Mother    Diabetes Mother    High Cholesterol Mother    Stomach cancer Neg Hx    Colon cancer Neg Hx    Esophageal cancer Neg Hx    Rectal cancer Neg Hx     Past Surgical History:  Procedure Laterality Date   ANKLE SURGERY Right 2014   DILITATION & CURRETTAGE/HYSTROSCOPY WITH NOVASURE ABLATION N/A 01/04/2022   Procedure: DILATATION & CURETTAGE/HYSTEROSCOPY WITH NOVASURE ABLATION;  Surgeon: Milas Hock, MD;  Location: [facility];  Service: Gynecology;  Laterality: N/A;   ESOPHAGOGASTRODUODENOSCOPY (EGD) WITH PROPOFOL  12/29/2021   by dr [name]   INTRAUTERINE DEVICE (IUD) INSERTION N/A 01/04/2022   Procedure: INTRAUTERINE DEVICE (IUD) INSERTION;  Surgeon: Milas Hock, MD;  Location: [facility];  Service: Gynecology;  Laterality: N/A;   Social History   Occupational History   Occupation: International aid/development worker  Tobacco Use   Smoking status: Every Day    Types: Cigarettes   Smokeless tobacco: Never   Tobacco comments:    12-30-2021  per pt average 1-2 cig per day down from 1/ 2ppd  Vaping Use   Vaping status: Never Used  Substance and Sexual Activity   Alcohol use: Yes    Alcohol/week: 3.0 standard drinks of alcohol    Types: 3 Cans of beer per week   Drug use: Yes    Types: Marijuana    Comment: 01/03/2022   Sexual activity: Yes    Birth control/protection: None

## 2023-07-25 ENCOUNTER — Inpatient Hospital Stay: Attending: Hematology & Oncology

## 2023-07-25 ENCOUNTER — Inpatient Hospital Stay (HOSPITAL_BASED_OUTPATIENT_CLINIC_OR_DEPARTMENT_OTHER): Admitting: Family

## 2023-07-25 VITALS — BP 117/79 | HR 97 | Temp 98.3°F | Resp 18 | Ht 71.0 in | Wt 184.0 lb

## 2023-07-25 DIAGNOSIS — D6859 Other primary thrombophilia: Secondary | ICD-10-CM

## 2023-07-25 DIAGNOSIS — I82512 Chronic embolism and thrombosis of left femoral vein: Secondary | ICD-10-CM | POA: Diagnosis not present

## 2023-07-25 DIAGNOSIS — I82502 Chronic embolism and thrombosis of unspecified deep veins of left lower extremity: Secondary | ICD-10-CM | POA: Diagnosis present

## 2023-07-25 DIAGNOSIS — Z7901 Long term (current) use of anticoagulants: Secondary | ICD-10-CM | POA: Insufficient documentation

## 2023-07-25 DIAGNOSIS — Z86711 Personal history of pulmonary embolism: Secondary | ICD-10-CM | POA: Diagnosis not present

## 2023-07-25 LAB — CMP (CANCER CENTER ONLY)
ALT: 21 U/L (ref 0–44)
AST: 15 U/L (ref 15–41)
Albumin: 4.4 g/dL (ref 3.5–5.0)
Alkaline Phosphatase: 53 U/L (ref 38–126)
Anion gap: 7 (ref 5–15)
BUN: 8 mg/dL (ref 6–20)
CO2: 29 mmol/L (ref 22–32)
Calcium: 9.3 mg/dL (ref 8.9–10.3)
Chloride: 104 mmol/L (ref 98–111)
Creatinine: 0.78 mg/dL (ref 0.44–1.00)
GFR, Estimated: >60 mL/min (ref >60)
Glucose, Bld: 151 mg/dL — ABNORMAL HIGH (ref 70–99)
Potassium: 3.8 mmol/L (ref 3.5–5.1)
Sodium: 140 mmol/L (ref 135–145)
Total Bilirubin: 0.7 mg/dL (ref 0.0–1.2)
Total Protein: 6.8 g/dL (ref 6.5–8.1)

## 2023-07-25 LAB — CBC WITH DIFFERENTIAL (CANCER CENTER ONLY)
Abs Immature Granulocytes: 0.01 10*3/uL (ref 0.00–0.07)
Basophils Absolute: 0 10*3/uL (ref 0.0–0.1)
Basophils Relative: 0 %
Eosinophils Absolute: 0.1 10*3/uL (ref 0.0–0.5)
Eosinophils Relative: 1 %
HCT: 42.1 % (ref 36.0–46.0)
Hemoglobin: 13.9 g/dL (ref 12.0–15.0)
Immature Granulocytes: 0 %
Lymphocytes Relative: 29 %
Lymphs Abs: 1.9 10*3/uL (ref 0.7–4.0)
MCH: 28.3 pg (ref 26.0–34.0)
MCHC: 33 g/dL (ref 30.0–36.0)
MCV: 85.7 fL (ref 80.0–100.0)
Monocytes Absolute: 0.3 10*3/uL (ref 0.1–1.0)
Monocytes Relative: 5 %
Neutro Abs: 4.2 10*3/uL (ref 1.7–7.7)
Neutrophils Relative %: 65 %
Platelet Count: 219 10*3/uL (ref 150–400)
RBC: 4.91 MIL/uL (ref 3.87–5.11)
RDW: 15.2 % (ref 11.5–15.5)
WBC Count: 6.6 10*3/uL (ref 4.0–10.5)
nRBC: 0 % (ref 0.0–0.2)

## 2023-07-25 LAB — LACTATE DEHYDROGENASE: LDH: 130 U/L (ref 98–192)

## 2023-07-25 NOTE — Progress Notes (Signed)
 Hematology and Oncology Follow Up Visit  Monica Holland [id number] 1986-01-18 38 y.o. 07/25/2023   Principle Diagnosis:  Chronic DVT of the left lower extremity  History of PE right lower lobe   Current Therapy:        Eliquis 5 mg PO BID   Interim History:  Monica Holland is here today for follow-up. She is doing well. She has been going to therapy to process her grief over the loss of her sister, brother and mom last year. This has been hard for her but she states that the counseling is helping.  She is eating well and staying hydrated. Her weight is stable at 184 lbs.  She has minimal swelling in the left lower extremity after a long day on her feet. This resolves with elevating her leg. None present at this time.  No numbness or tingling in her extremities.  No falls or syncope.  No fever, chills, n/v, cough, rash, dizziness. SOB, chest pain, palpitations, abdominal pain or changes in bowel or bladder habits at this time.   ECOG Performance Status: 1 - Symptomatic but completely ambulatory  Medications:  Allergies as of 07/25/2023   No Known Allergies      Medication List        Accurate as of July 25, 2023  8:51 AM. If you have any questions, ask your nurse or doctor.          albuterol 108 (90 Base) MCG/ACT inhaler Commonly known as: VENTOLIN HFA Inhale 1-2 puffs into the lungs every 6 (six) hours as needed for wheezing or shortness of breath.   apixaban 5 MG Tabs tablet Commonly known as: Eliquis Take 1 tablet (5 mg total) by mouth 2 (two) times daily.   azelastine 0.1 % nasal spray Commonly known as: ASTELIN Place 2 sprays into both nostrils 2 (two) times daily as needed.   cetirizine 10 MG tablet Commonly known as: ZyrTEC Allergy Take 1 tablet (10 mg total) by mouth daily.   cyclobenzaprine 10 MG tablet Commonly known as: FLEXERIL Take 1 tablet (10 mg total) by mouth 3 (three) times daily as needed for muscle spasms.   diphenhydramine-acetaminophen  25-500 MG Tabs tablet Commonly known as: TYLENOL PM Take 1 tablet by mouth at bedtime as needed.   fluticasone 50 MCG/ACT nasal spray Commonly known as: FLONASE Place 2 sprays into both nostrils daily.   HYDROcodone-acetaminophen 5-325 MG tablet Commonly known as: NORCO/VICODIN Take 1 tablet by mouth every 8 (eight) hours as needed for moderate pain (pain score 4-6).   lidocaine 5 % ointment Commonly known as: XYLOCAINE Apply 1 Application topically as needed.   lidocaine 5 % Commonly known as: Lidoderm Place 1 patch onto the skin daily. Remove & Discard patch within 12 hours or as directed by MD   methocarbamol 500 MG tablet Commonly known as: ROBAXIN Take 1 tablet (500 mg total) by mouth 2 (two) times daily.   mirtazapine 15 MG tablet Commonly known as: REMERON Take 15 mg by mouth at bedtime.   NICOTINE TD Place onto the skin daily.   ondansetron 4 MG disintegrating tablet Commonly known as: ZOFRAN-ODT Take 1 tablet (4 mg total) by mouth every 8 (eight) hours as needed for nausea or vomiting.   pantoprazole 40 MG tablet Commonly known as: PROTONIX Take 1 tablet (40 mg total) by mouth 2 (two) times daily.   predniSONE 10 MG tablet Commonly known as: DELTASONE Take 1 tablet (10 mg total) by mouth daily with breakfast.  Vitamin D3 1.25 MG (50000 UT) Caps Take 50,000 Units by mouth once a week.   VITAMIN D3 COMPLETE PO Take by mouth as needed.        Allergies: No Known Allergies  Past Medical History, Surgical history, Social history, and Family History were reviewed and updated.  Review of Systems: All other 10 point review of systems is negative.   Physical Exam:  vitals were not taken for this visit.   Wt Readings from Last 3 Encounters:  11/20/22 171 lb 1.3 oz (77.6 kg)  07/10/22 195 lb (88.5 kg)  01/16/22 170 lb (77.1 kg)    Ocular: Sclerae unicteric, pupils equal, round and reactive to light Ear-nose-throat: Oropharynx clear, dentition  fair Lymphatic: No cervical or supraclavicular adenopathy Lungs no rales or rhonchi, good excursion bilaterally Heart regular rate and rhythm, no murmur appreciated Abd soft, nontender, positive bowel sounds MSK no focal spinal tenderness, no joint edema Neuro: non-focal, well-oriented, appropriate affect Breasts: Deferred   Lab Results  Component Value Date   WBC 6.6 07/25/2023   HGB 13.9 07/25/2023   HCT 42.1 07/25/2023   MCV 85.7 07/25/2023   PLT 219 07/25/2023   Lab Results  Component Value Date   FERRITIN 24 03/04/2008   IRON 29 (L) 03/04/2008   TIBC 440 03/04/2008   UIBC 411 03/04/2008   IRONPCTSAT 7 (L) 03/04/2008   Lab Results  Component Value Date   RBC 4.91 07/25/2023   No results found for: "KPAFRELGTCHN", "LAMBDASER", "KAPLAMBRATIO" No results found for: "IGGSERUM", "IGA", "IGMSERUM" No results found for: "TOTALPROTELP", "ALBUMINELP", "A1GS", "A2GS", "BETS", "BETA2SER", "GAMS", "MSPIKE", "SPEI"   Chemistry      Component Value Date/Time   NA 140 11/20/2022 1038   K 4.2 11/20/2022 1038   CL 105 11/20/2022 1038   CO2 31 11/20/2022 1038   BUN 8 11/20/2022 1038   CREATININE 0.96 11/20/2022 1038      Component Value Date/Time   CALCIUM 9.6 11/20/2022 1038   ALKPHOS 45 11/20/2022 1038   AST 14 (L) 11/20/2022 1038   ALT 12 11/20/2022 1038   BILITOT 0.5 11/20/2022 1038       Impression and Plan: Monica Holland is a very pleasant 38 yo African American female with history of multiple recurrent thrombotic events now on life anticoagulation with Eliquis. She also has family history of thrombotic events on maternal and paternal sides.  Hyper coag panel was negative.  No changes to her current anticoagulation regimen.  Lab check in 6 months and follow-up in 1 year.   Eileen Stanford, NP 4/9/20258:51 AM

## 2023-08-10 ENCOUNTER — Other Ambulatory Visit: Payer: Self-pay | Admitting: Family

## 2023-08-20 ENCOUNTER — Encounter: Payer: Self-pay | Admitting: Orthopedic Surgery

## 2023-08-20 ENCOUNTER — Ambulatory Visit (INDEPENDENT_AMBULATORY_CARE_PROVIDER_SITE_OTHER): Admitting: Orthopedic Surgery

## 2023-08-20 DIAGNOSIS — M25512 Pain in left shoulder: Secondary | ICD-10-CM | POA: Diagnosis not present

## 2023-08-20 NOTE — Progress Notes (Signed)
 Office Visit Note   Patient: Monica Holland           Date of Birth: 1985/05/30           MRN: [id number] Visit Date: 08/20/2023              Requested by: Nick Barman, MD [address] [place],  [place] [address] PCP: Nick Barman, MD  Chief Complaint  Patient presents with   Left Shoulder - Pain      HPI: Patient is a 38 year old woman who is seen in follow-up for her left shoulder.  Patient has been to physical therapy at [facility] and she states she has had no improvement in her shoulder symptoms.  She states she does have 4 therapy sessions left.  Patient states she still has a lot of pain in the left shoulder and has pain in the shoulder with trying to use the right upper extremity.  Assessment & Plan: Visit Diagnoses:  1. Acute pain of left shoulder     Plan: Patient was provided a prescription for light duty modified work.  No lifting with the left arm greater than 5 pounds.  Will have the patient follow-up with Dr. Rozelle Corning a shoulder specialist to see if arthroscopic intervention may be helpful.  Follow-Up Instructions: Return if symptoms worsen or fail to improve.   Ortho Exam  Patient is alert, oriented, no adenopathy, well-dressed, normal affect, normal respiratory effort. Examination of the MRI scan does show some interstitial edema with edema on the bursal surface of the rotator cuff.  There is no full-thickness tear.  Patient has pain with any attempted range of motion of the left shoulder.  Imaging: No results found. No images are attached to the encounter.  Labs: Lab Results  Component Value Date   REPTSTATUS 04/04/2020 FINAL 04/02/2020   CULT MULTIPLE SPECIES PRESENT, SUGGEST RECOLLECTION (A) 04/02/2020     Lab Results  Component Value Date   ALBUMIN 4.4 07/25/2023   ALBUMIN 4.5 11/20/2022   ALBUMIN 4.5 07/10/2022    No results found for: "MG" No results found for: "VD25OH"  No results found for: "PREALBUMIN"    Latest Ref  Rng & Units 07/25/2023    8:25 AM 11/20/2022   10:38 AM 07/10/2022    2:57 PM  CBC EXTENDED  WBC 4.0 - 10.5 K/uL 6.6  3.1  5.6   RBC 3.87 - 5.11 MIL/uL 4.91  5.31  4.61   Hemoglobin 12.0 - 15.0 g/dL 21.3  08.6  57.8   HCT 36.0 - 46.0 % 42.1  44.9  39.2   Platelets 150 - 400 K/uL 219  233  165   NEUT# 1.7 - 7.7 K/uL 4.2  1.3  2.8   Lymph# 0.7 - 4.0 K/uL 1.9  1.5  2.1      There is no height or weight on file to calculate BMI.  Orders:  No orders of the defined types were placed in this encounter.  No orders of the defined types were placed in this encounter.    Procedures: No procedures performed  Clinical Data: No additional findings.  ROS:  All other systems negative, except as noted in the HPI. Review of Systems  Objective: Vital Signs: There were no vitals taken for this visit.  Specialty Comments:  No specialty comments available.  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) 08/01/2018   Cigarette smoker 05/03/2018   Chronic venous insufficiency 09/05/2016   Past Medical History:  Diagnosis Date   Abnormal uterine bleeding (AUB)    heavy vaginal bleeding   Anticoagulant long-term use    eliquis --- managed by dr Maria Shiner (hematologist)   Chronic nausea    per pt intermittant , without vomiting,  had normal EGD 12-29-2021 by dr Rosaline Coma   Chronic venous insufficiency of lower extremity    evaluted by dr Runell Countryman (vascular) 03-15-2021 note in epic, chronic venous insuff LLE causing edema/ post thombotic syndrome   GERD (gastroesophageal reflux disease)    History of pulmonary embolus (PE) 2014   nonocclusive and RLE DVT   History of recurrent deep vein thrombosis (DVT)    LLE in 2004, 2009, 2016, 2021  & 2022 chronic DVT thrombis /  in 2014 , RLE and PE;   followed by dr Maria Shiner  (hematology)  negative work-up for blood disorder ,  secondary to  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   Wears glasses     Family History  Problem Relation Age of Onset   Deep vein thrombosis Mother    Hyperlipidemia Mother    Diabetes Mother    High Cholesterol Mother    Stomach cancer Neg Hx    Colon cancer Neg Hx    Esophageal cancer Neg Hx    Rectal cancer Neg Hx     Past Surgical History:  Procedure Laterality Date   ANKLE SURGERY Right 2014   DILITATION & CURRETTAGE/HYSTROSCOPY WITH NOVASURE ABLATION N/A 01/04/2022   Procedure: DILATATION & CURETTAGE/HYSTEROSCOPY WITH NOVASURE ABLATION;  Surgeon: Lacey Pian, MD;  Location: [facility] [place];  Service: Gynecology;  Laterality: N/A;   ESOPHAGOGASTRODUODENOSCOPY (EGD) WITH PROPOFOL   12/29/2021   by dr Rosaline Coma   INTRAUTERINE DEVICE (IUD) INSERTION N/A 01/04/2022   Procedure: INTRAUTERINE DEVICE (IUD) INSERTION;  Surgeon: Lacey Pian, MD;  Location: [facility] [place];  Service: Gynecology;  Laterality: N/A;   Social History   Occupational History   Occupation: International aid/development worker  Tobacco Use   Smoking status: Every Day    Types: Cigarettes   Smokeless tobacco: Never   Tobacco comments:    12-30-2021  per pt average 1-2 cig per day down from 1/ 2ppd  Vaping Use   Vaping status: Never Used  Substance and Sexual Activity   Alcohol use: Yes    Alcohol/week: 3.0 standard drinks of alcohol    Types: 3 Cans of beer per week   Drug use: Yes    Types: Marijuana    Comment: 01/03/2022   Sexual activity: Yes    Birth control/protection: None

## 2023-08-27 ENCOUNTER — Telehealth: Payer: Self-pay | Admitting: Orthopedic Surgery

## 2023-08-27 NOTE — Telephone Encounter (Signed)
 Anna nurse called and need the doctor's note from May 5th when the patient was seen. ZO#[phone]

## 2023-08-27 NOTE — Telephone Encounter (Signed)
 Monica Holland the patient nurse called and said that they need a referral from [facility] to see dean. ZO#[phone]

## 2023-08-28 NOTE — Telephone Encounter (Signed)
 This is a duplicate message. 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 Pt has an appt for her shoulder 09/13/2023. Asked to advise if there is anything that I need to do.

## 2023-08-28 NOTE — Telephone Encounter (Signed)
 Is this a work comp pt? She has an appt with Dr. Rozelle Corning for her shoulder on 09/13/2023. Can you let me know if there is anything that I need to do?

## 2023-08-29 ENCOUNTER — Other Ambulatory Visit: Payer: Self-pay

## 2023-08-29 DIAGNOSIS — M25512 Pain in left shoulder: Secondary | ICD-10-CM

## 2023-08-29 NOTE — Telephone Encounter (Signed)
 Ok thank you ma'am the referral is in the chart! Let me know if there is anything else that you need me to do. Thanks

## 2023-08-31 ENCOUNTER — Ambulatory Visit: Admitting: Orthopedic Surgery

## 2023-09-13 ENCOUNTER — Ambulatory Visit (HOSPITAL_COMMUNITY)
Admission: EM | Admit: 2023-09-13 | Discharge: 2023-09-13 | Disposition: A | Discharge status: Home / Self Care | Attending: Emergency Medicine | Admitting: Emergency Medicine

## 2023-09-13 ENCOUNTER — Ambulatory Visit: Admitting: Orthopedic Surgery

## 2023-09-13 ENCOUNTER — Encounter (HOSPITAL_COMMUNITY): Payer: Self-pay

## 2023-09-13 DIAGNOSIS — A084 Viral intestinal infection, unspecified: Secondary | ICD-10-CM | POA: Insufficient documentation

## 2023-09-13 DIAGNOSIS — R112 Nausea with vomiting, unspecified: Secondary | ICD-10-CM | POA: Insufficient documentation

## 2023-09-13 DIAGNOSIS — R197 Diarrhea, unspecified: Secondary | ICD-10-CM | POA: Diagnosis not present

## 2023-09-13 LAB — POCT URINALYSIS DIP (MANUAL ENTRY)
Bilirubin, UA: NEGATIVE
Blood, UA: NEGATIVE
Glucose, UA: NEGATIVE mg/dL
Leukocytes, UA: NEGATIVE
Nitrite, UA: NEGATIVE
Spec Grav, UA: >=1.03 — AB (ref 1.010–1.025)
Urobilinogen, UA: 0.2 U/dL
pH, UA: 5.5 (ref 5.0–8.0)

## 2023-09-13 LAB — COMPREHENSIVE METABOLIC PANEL WITH GFR
ALT: 17 U/L (ref 0–44)
AST: 21 U/L (ref 15–41)
Albumin: 4.1 g/dL (ref 3.5–5.0)
Alkaline Phosphatase: 52 U/L (ref 38–126)
Anion gap: 9 (ref 5–15)
BUN: 8 mg/dL (ref 6–20)
CO2: 27 mmol/L (ref 22–32)
Calcium: 9.3 mg/dL (ref 8.9–10.3)
Chloride: 104 mmol/L (ref 98–111)
Creatinine, Ser: 0.94 mg/dL (ref 0.44–1.00)
GFR, Estimated: >60 mL/min (ref >60)
Glucose, Bld: 84 mg/dL (ref 70–99)
Potassium: 3.9 mmol/L (ref 3.5–5.1)
Sodium: 140 mmol/L (ref 135–145)
Total Bilirubin: 1 mg/dL (ref 0.0–1.2)
Total Protein: 7.2 g/dL (ref 6.5–8.1)

## 2023-09-13 MED ORDER — ONDANSETRON 4 MG PO TBDP: 4.0000 mg | ORAL_TABLET | Freq: Three times a day (TID) | ORAL | 0 refills | Status: DC | PRN

## 2023-09-13 MED ORDER — LOPERAMIDE HCL 2 MG PO CAPS: 2.0000 mg | ORAL_CAPSULE | Freq: Four times a day (QID) | ORAL | 0 refills | Status: DC | PRN

## 2023-09-13 NOTE — ED Provider Notes (Signed)
 [facility]    CSN: [id number] Arrival date & time: 09/13/23  1507      History   Chief Complaint Chief Complaint  Patient presents with   Emesis    HPI Monica Holland is a 38 y.o. female.   Patient presents with abdominal pain, vomiting, diarrhea, body aches, chills, weakness, and mild headache that began last night.  Patient states the abdominal pain began around 11:00 last night.  And then around 3 AM this morning she began to have diarrhea and vomiting.  Patient states that she has probably had around 15 episodes of diarrhea since 3 AM and around 5 episodes of vomiting.  Denies any blood in stool or vomit, known fever, dizziness, and loss of consciousness.  Patient reports she has been taking Pepto-Bismol and Zofran  with minimal relief.  Patient reports recent history of excessive alcohol use from around April 2024 to around December 2024.  Patient states that she has cut back on her alcohol intake since December, but states that she did have a few shots of liquor a few days ago.  The history is provided by the patient and medical records.  Emesis   Past Medical History:  Diagnosis Date   Abnormal uterine bleeding (AUB)    heavy vaginal bleeding   Anticoagulant long-term use    eliquis --- managed by dr Maria Shiner (hematologist)   Chronic nausea    per pt intermittant , without vomiting,  had normal EGD 12-29-2021 by dr Rosaline Coma   Chronic venous insufficiency of lower extremity    evaluted by dr Runell Countryman (vascular) 03-15-2021 note in epic, chronic venous insuff LLE causing edema/ post thombotic syndrome   GERD (gastroesophageal reflux disease)    History of pulmonary embolus (PE) 2014   nonocclusive and RLE DVT   History of recurrent deep vein thrombosis (DVT)    LLE in 2004, 2009, 2016, 2021  & 2022 chronic DVT thrombis /  in 2014 , RLE and PE;   followed by dr Maria Shiner (hematology)  negative work-up for blood disorder ,  secondary to  chronic venous insuff.  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) 08/01/2018   Cigarette smoker 05/03/2018   Chronic venous insufficiency 09/05/2016    Past Surgical History:  Procedure Laterality Date   ANKLE SURGERY Right 2014   DILITATION & CURRETTAGE/HYSTROSCOPY WITH NOVASURE ABLATION N/A 01/04/2022   Procedure: DILATATION & CURETTAGE/HYSTEROSCOPY WITH NOVASURE ABLATION;  Surgeon: Lacey Pian, MD;  Location: [facility];  Service: Gynecology;  Laterality: N/A;   ESOPHAGOGASTRODUODENOSCOPY (EGD) WITH PROPOFOL   12/29/2021   by dr Rosaline Coma   INTRAUTERINE DEVICE (IUD) INSERTION N/A 01/04/2022   Procedure: INTRAUTERINE DEVICE (IUD) INSERTION;  Surgeon: Lacey Pian, MD;  Location: [facility];  Service: Gynecology;  Laterality: N/A;    OB History     Gravida  4   Para  4   Term  4   Preterm  0   AB  0   Living  0      SAB  0   IAB  0   Ectopic  0   Multiple  0   Live Births  0            Home Medications  Prior to Admission medications   Medication Sig Start Date End Date Taking? Authorizing Provider  albuterol  (VENTOLIN  HFA) 108 (90 Base) MCG/ACT inhaler Inhale 1-2 puffs into the lungs every 6 (six) hours as needed for wheezing or shortness of breath. 02/08/22  Yes Gretel Leaven, PA-C  apixaban  (ELIQUIS ) 5 MG TABS tablet Take 1 tablet (5 mg total) by mouth 2 (two) times daily. 06/12/23  Yes Tish Forge, NP  azelastine (ASTELIN) 0.1 % nasal spray Place 2 sprays into both nostrils 2 (two) times daily as needed. 08/16/21  Yes [provider]  cetirizine  (ZYRTEC  ALLERGY) 10 MG tablet Take 1 tablet (10 mg total) by mouth daily. 04/10/23  Yes Rising, Ivette Marks, PA-C  cyclobenzaprine  (FLEXERIL ) 10 MG tablet Take 1 tablet (10 mg  total) by mouth 3 (three) times daily as needed for muscle spasms. 05/25/23  Yes Zamora, Erin R, NP  fluticasone  (FLONASE ) 50 MCG/ACT nasal spray Place 2 sprays into both nostrils daily. 04/10/23  Yes Rising, Ivette Marks, PA-C  HYDROcodone -acetaminophen  (NORCO/VICODIN) 5-325 MG tablet Take 1 tablet by mouth every 8 (eight) hours as needed for moderate pain (pain score 4-6). 06/13/23  Yes Felicity Horseman R, NP  lidocaine  (LIDODERM ) 5 % PLACE 1 PATCH ONTO THE SKIN DAILY. REMOVE & DISCARD PATCH WITHIN 12 HOURS OR AS DIRECTED BY MD 08/13/23  Yes Timothy Ford, MD  lidocaine  (XYLOCAINE ) 5 % ointment Apply 1 Application topically as needed. 11/11/22  Yes Raspet, Erin K, PA-C  loperamide  (IMODIUM ) 2 MG capsule Take 1 capsule (2 mg total) by mouth 4 (four) times daily as needed for diarrhea or loose stools. 09/13/23  Yes Rosevelt Constable, Neko Mcgeehan A, NP  methocarbamol  (ROBAXIN ) 500 MG tablet Take 1 tablet (500 mg total) by mouth 2 (two) times daily. 11/11/22  Yes Raspet, Erin K, PA-C  mirtazapine (REMERON) 15 MG tablet Take 15 mg by mouth at bedtime. 11/07/22  Yes [provider]  ondansetron  (ZOFRAN -ODT) 4 MG disintegrating tablet Take 1 tablet (4 mg total) by mouth every 8 (eight) hours as needed for nausea or vomiting. 09/13/23  Yes Levora Reas A, NP  pantoprazole  (PROTONIX ) 40 MG tablet Take 1 tablet (40 mg total) by mouth 2 (two) times daily. Patient taking differently: Take 40 mg by mouth 2 (two) times daily. 11/15/21  Yes Graciella Lavender, PA  Rivaroxaban  15 & 20 MG TBPK Take as directed on package: Start with one 15mg  tablet by mouth twice a day with food. On Day 22, switch to one 20mg  tablet once a day with food. Patient not taking: Reported on 04/02/2020 03/05/13 06/25/20  Deatra Face, MD    Family History Family History  Problem Relation Age of Onset   Deep vein thrombosis Mother    Hyperlipidemia Mother    Diabetes Mother    High Cholesterol Mother    Stomach cancer Neg Hx    Colon cancer Neg  Hx    Esophageal cancer Neg Hx    Rectal cancer Neg Hx     Social History Social History   Tobacco Use   Smoking status: Every Day    Types: Cigarettes   Smokeless tobacco: Never   Tobacco comments:    12-30-2021  per pt average 1-2 cig per day down from 1/ 2ppd  Vaping Use   Vaping status: Never Used  Substance Use Topics   Alcohol use: Yes    Alcohol/week: 3.0 standard drinks of alcohol    Types: 3 Cans of beer per week   Drug use: Yes  Types: Marijuana    Comment: 01/03/2022     Allergies   Nsaids   Review of Systems Review of Systems  Gastrointestinal:  Positive for vomiting.   Per HPI  Physical Exam Triage Vital Signs ED Triage Vitals  Encounter Vitals Group     BP 09/13/23 1531 113/74     Systolic BP Percentile --      Diastolic BP Percentile --      Pulse Rate 09/13/23 1531 (!) 102     Resp 09/13/23 1531 18     Temp 09/13/23 1531 98.5 F (36.9 C)     Temp Source 09/13/23 1531 Oral     SpO2 09/13/23 1531 97 %     Weight --      Height --      Head Circumference --      Peak Flow --      Pain Score 09/13/23 1528 8     Pain Loc --      Pain Education --      Exclude from Growth Chart --    No data found.  Updated Vital Signs BP 113/74 (BP Location: Right Arm)   Pulse (!) 102   Temp 98.5 F (36.9 C) (Oral)   Resp 18   SpO2 97%   Visual Acuity Right Eye Distance:   Left Eye Distance:   Bilateral Distance:    Right Eye Near:   Left Eye Near:    Bilateral Near:     Physical Exam Vitals and nursing note reviewed.  Constitutional:      General: She is awake. She is not in acute distress.    Appearance: Normal appearance. She is well-developed and well-groomed. She is ill-appearing.  Cardiovascular:     Rate and Rhythm: Regular rhythm. Tachycardia present.  Pulmonary:     Effort: Pulmonary effort is normal.     Breath sounds: Normal breath sounds.  Abdominal:     General: Abdomen is flat. Bowel sounds are normal. There is no  distension.     Palpations: Abdomen is soft. There is no mass.     Tenderness: There is abdominal tenderness in the epigastric area and left upper quadrant. There is no right CVA tenderness, left CVA tenderness, guarding or rebound.     Hernia: No hernia is present.  Skin:    General: Skin is warm and dry.  Neurological:     Mental Status: She is alert.  Psychiatric:        Behavior: Behavior is cooperative.      UC Treatments / Results  Labs (all labs ordered are listed, but only abnormal results are displayed) Labs Reviewed  POCT URINALYSIS DIP (MANUAL ENTRY) - Abnormal; Notable for the following components:      Result Value   Ketones, POC UA trace (5) (*)    Spec Grav, UA >=1.030 (*)    Protein Ur, POC trace (*)    All other components within normal limits  COMPREHENSIVE METABOLIC PANEL WITH GFR    EKG   Radiology No results found.  Procedures Procedures (including critical care time)  Medications Ordered in UC Medications - No data to display  Initial Impression / Assessment and Plan / UC Course  I have reviewed the triage vital signs and the nursing notes.  Pertinent labs & imaging results that were available during my care of the patient were reviewed by me and considered in my medical decision making (see chart for details).     Patient is  ill-appearing.  Vitals are stable.  Mild tachycardia present likely leading to some mild dehydration.  Upon assessment tenderness noted to epigastric region and left upper quadrant.  Symptoms likely viral in nature due to sudden onset.  Urinalysis reveals some signs of mild dehydration.  CMP ordered to evaluate electrolytes, renal, and liver function.  Discussed importance of hydration.  Prescribe Zofran  as needed for nausea and vomiting.  Prescribed Imodium as needed for diarrhea.  Discussed follow-up, return, and strict ER precautions. Final Clinical Impressions(s) / UC Diagnoses   Final diagnoses:  Viral  gastroenteritis  Nausea vomiting and diarrhea     Discharge Instructions      As discussed I believe your symptoms are likely related to a viral gastroenteritis, or stomach virus. Take Zofran  every 8 hours as needed for nausea and vomiting.  This will dissolve under your tongue. Take Imodium 4 times daily as needed for diarrhea. Make sure you are staying hydrated and getting plenty of rest. We have drawn some lab work today and will call if the results are concerning or require any additional treatment. If you develop persistent worsening abdominal pain, excessive vomiting, persistent and excessive diarrhea, fevers unrelieved by medication, or passing out please seek immediate medical treatment in the emergency department.   ED Prescriptions     Medication Sig Dispense Auth. Provider   ondansetron  (ZOFRAN -ODT) 4 MG disintegrating tablet Take 1 tablet (4 mg total) by mouth every 8 (eight) hours as needed for nausea or vomiting. 10 tablet Levora Reas A, NP   loperamide (IMODIUM) 2 MG capsule Take 1 capsule (2 mg total) by mouth 4 (four) times daily as needed for diarrhea or loose stools. 12 capsule Levora Reas A, NP      PDMP not reviewed this encounter.   Levora Reas A, NP 09/13/23 [phone]

## 2023-09-13 NOTE — ED Triage Notes (Signed)
 Chief Complaint: diarrhea, headache, weak, vomiting, chills, and feeling feverish. No appetite.  Sick exposure: No  Onset: today  Prescriptions or OTC medications tried: Yes- pepto bismol, Zofran ,    with mild relief  New foods, medications, or products: No  Recent Travel: No

## 2023-09-13 NOTE — Discharge Instructions (Addendum)
 As discussed I believe your symptoms are likely related to a viral gastroenteritis, or stomach virus. Take Zofran  every 8 hours as needed for nausea and vomiting.  This will dissolve under your tongue. Take Imodium  4 times daily as needed for diarrhea. Make sure you are staying hydrated and getting plenty of rest. We have drawn some lab work today and will call if the results are concerning or require any additional treatment. If you develop persistent worsening abdominal pain, excessive vomiting, persistent and excessive diarrhea, fevers unrelieved by medication, or passing out please seek immediate medical treatment in the emergency department.

## 2023-09-14 ENCOUNTER — Emergency Department (HOSPITAL_BASED_OUTPATIENT_CLINIC_OR_DEPARTMENT_OTHER)

## 2023-09-14 ENCOUNTER — Ambulatory Visit (HOSPITAL_COMMUNITY): Payer: Self-pay

## 2023-09-14 ENCOUNTER — Emergency Department (HOSPITAL_BASED_OUTPATIENT_CLINIC_OR_DEPARTMENT_OTHER)
Admission: EM | Admit: 2023-09-14 | Discharge: 2023-09-14 | Disposition: A | Discharge status: Home / Self Care | Attending: Emergency Medicine | Admitting: Emergency Medicine

## 2023-09-14 ENCOUNTER — Other Ambulatory Visit: Payer: Self-pay

## 2023-09-14 ENCOUNTER — Encounter (HOSPITAL_BASED_OUTPATIENT_CLINIC_OR_DEPARTMENT_OTHER): Payer: Self-pay

## 2023-09-14 DIAGNOSIS — R197 Diarrhea, unspecified: Secondary | ICD-10-CM | POA: Insufficient documentation

## 2023-09-14 DIAGNOSIS — R1013 Epigastric pain: Secondary | ICD-10-CM | POA: Insufficient documentation

## 2023-09-14 DIAGNOSIS — F17210 Nicotine dependence, cigarettes, uncomplicated: Secondary | ICD-10-CM | POA: Insufficient documentation

## 2023-09-14 DIAGNOSIS — R112 Nausea with vomiting, unspecified: Secondary | ICD-10-CM | POA: Insufficient documentation

## 2023-09-14 DIAGNOSIS — Z7901 Long term (current) use of anticoagulants: Secondary | ICD-10-CM | POA: Insufficient documentation

## 2023-09-14 DIAGNOSIS — R1011 Right upper quadrant pain: Secondary | ICD-10-CM | POA: Insufficient documentation

## 2023-09-14 DIAGNOSIS — R1084 Generalized abdominal pain: Secondary | ICD-10-CM

## 2023-09-14 LAB — CBC
HCT: 43.2 % (ref 36.0–46.0)
Hemoglobin: 14.3 g/dL (ref 12.0–15.0)
MCH: 28.4 pg (ref 26.0–34.0)
MCHC: 33.1 g/dL (ref 30.0–36.0)
MCV: 85.9 fL (ref 80.0–100.0)
Platelets: 205 10*3/uL (ref 150–400)
RBC: 5.03 MIL/uL (ref 3.87–5.11)
RDW: 14.5 % (ref 11.5–15.5)
WBC: 5.2 10*3/uL (ref 4.0–10.5)
nRBC: 0 % (ref 0.0–0.2)

## 2023-09-14 LAB — URINALYSIS, ROUTINE W REFLEX MICROSCOPIC
Bacteria, UA: NONE SEEN
Bilirubin Urine: NEGATIVE
Glucose, UA: NEGATIVE mg/dL
Hgb urine dipstick: NEGATIVE
Ketones, ur: NEGATIVE mg/dL
Nitrite: NEGATIVE
Protein, ur: NEGATIVE mg/dL
Specific Gravity, Urine: 1.016 (ref 1.005–1.030)
pH: 6 (ref 5.0–8.0)

## 2023-09-14 LAB — COMPREHENSIVE METABOLIC PANEL WITH GFR
ALT: 11 U/L (ref 0–44)
AST: 15 U/L (ref 15–41)
Albumin: 3.9 g/dL (ref 3.5–5.0)
Alkaline Phosphatase: 58 U/L (ref 38–126)
Anion gap: 11 (ref 5–15)
BUN: <5 mg/dL — ABNORMAL LOW (ref 6–20)
CO2: 24 mmol/L (ref 22–32)
Calcium: 8.8 mg/dL — ABNORMAL LOW (ref 8.9–10.3)
Chloride: 103 mmol/L (ref 98–111)
Creatinine, Ser: 0.77 mg/dL (ref 0.44–1.00)
GFR, Estimated: >60 mL/min (ref >60)
Glucose, Bld: 90 mg/dL (ref 70–99)
Potassium: 3.6 mmol/L (ref 3.5–5.1)
Sodium: 139 mmol/L (ref 135–145)
Total Bilirubin: 0.6 mg/dL (ref 0.0–1.2)
Total Protein: 6.6 g/dL (ref 6.5–8.1)

## 2023-09-14 LAB — HCG, QUANTITATIVE, PREGNANCY: hCG, Beta Chain, Quant, S: <1 m[IU]/mL (ref <5)

## 2023-09-14 LAB — LIPASE, BLOOD: Lipase: 20 U/L (ref 11–51)

## 2023-09-14 MED ORDER — PROMETHAZINE HCL 25 MG PO TABS: 25.0000 mg | ORAL_TABLET | Freq: Four times a day (QID) | ORAL | 0 refills | Status: DC | PRN

## 2023-09-14 MED ORDER — FENTANYL CITRATE PF 50 MCG/ML IJ SOSY
25.0000 ug | PREFILLED_SYRINGE | Freq: Once | INTRAMUSCULAR | Status: AC
  Administered 2023-09-14: 25 ug via INTRAVENOUS
  Filled 2023-09-14: qty 1

## 2023-09-14 MED ORDER — MORPHINE SULFATE (PF) 4 MG/ML IV SOLN
4.0000 mg | Freq: Once | INTRAVENOUS | Status: AC
  Administered 2023-09-14: 4 mg via INTRAVENOUS
  Filled 2023-09-14: qty 1

## 2023-09-14 MED ORDER — DICYCLOMINE HCL 20 MG PO TABS: 20.0000 mg | ORAL_TABLET | Freq: Two times a day (BID) | ORAL | 0 refills | Status: DC | PRN

## 2023-09-14 MED ORDER — PROMETHAZINE HCL 25 MG RE SUPP: 25.0000 mg | Freq: Four times a day (QID) | RECTAL | 0 refills | Status: DC | PRN

## 2023-09-14 MED ORDER — ALUM & MAG HYDROXIDE-SIMETH 200-200-20 MG/5ML PO SUSP
30.0000 mL | Freq: Once | ORAL | Status: AC
  Administered 2023-09-14: 30 mL via ORAL
  Filled 2023-09-14: qty 30

## 2023-09-14 MED ORDER — FAMOTIDINE 20 MG PO TABS
20.0000 mg | ORAL_TABLET | Freq: Once | ORAL | Status: AC
  Administered 2023-09-14: 20 mg via ORAL
  Filled 2023-09-14: qty 1

## 2023-09-14 MED ORDER — IOHEXOL 300 MG/ML  SOLN
100.0000 mL | Freq: Once | INTRAMUSCULAR | Status: AC | PRN
  Administered 2023-09-14: 100 mL via INTRAVENOUS

## 2023-09-14 MED ORDER — HYDROMORPHONE HCL 1 MG/ML IJ SOLN
0.5000 mg | Freq: Once | INTRAMUSCULAR | Status: AC
  Administered 2023-09-14: 0.5 mg via INTRAVENOUS
  Filled 2023-09-14: qty 1

## 2023-09-14 MED ORDER — ONDANSETRON HCL 4 MG/2ML IJ SOLN
4.0000 mg | Freq: Once | INTRAMUSCULAR | Status: AC
  Administered 2023-09-14: 4 mg via INTRAVENOUS
  Filled 2023-09-14: qty 2

## 2023-09-14 MED ORDER — SODIUM CHLORIDE 0.9 % IV BOLUS
1000.0000 mL | Freq: Once | INTRAVENOUS | Status: AC
  Administered 2023-09-14: 1000 mL via INTRAVENOUS

## 2023-09-14 NOTE — ED Notes (Signed)
 Pt not feeling nauseated upon DC... Pt was AO x4... No SOB... DC paperwork given and verbally understood.

## 2023-09-14 NOTE — ED Notes (Signed)
 Pt aware of the need for a urine... Unable to currently provide a sample.Marland KitchenMarland Kitchen

## 2023-09-14 NOTE — Discharge Instructions (Addendum)
 Discussed, your workup today was overall reassuring.  Your blood work appeared normal without acute emergent process.  CT imaging did not show anything obvious as cause of your symptoms.  Will send you home with nausea medicine, medicine for your abdominal pain.  You may continue take Imodium for any diarrhea.  Recommend follow-up with your primary care for reassessment.  Please do not hesitate to return to the emergency department if the worrisome signs and symptoms we discussed become apparent.

## 2023-09-14 NOTE — ED Triage Notes (Signed)
 In for eval of intermittent RUQ abd pain with nausea and diarrhea. Vomiting yesterday. Very little oral intake. Was seen by UC yesterday, rx for imodium and zofran  without relief. Chills. Denies dysuria.

## 2023-09-14 NOTE — ED Provider Notes (Signed)
 [place] EMERGENCY DEPARTMENT AT [facility] Provider Note   CSN: [id number] Arrival date & time: 09/14/23  1052     History  Chief Complaint  Patient presents with   Abdominal Pain    Monica Holland is a 38 y.o. female.   Abdominal Pain   38 year old female presents emergency department with complaints of abdominal pain, nausea, diarrhea. Symptoms began around 3am yesterday morning.  And constant since onset.  Does report some "waxing and waning intensity" since symptom onset.  Does report 4-5 episodes of emesis yesterday with 20+ episodes of loose bowel movements.  Denies any hematemesis, hematochezia/melena.  Was seen in urgent care yesterday given Zofran , Imodium  diagnosed with viral gastroenteritis and states that symptoms have not improved very much since then.  Reports pain mainly in upper middle as well as right upper abdomen.  Denies any urinary symptoms, vaginal symptoms.  Denies any recent antibiotic use, known sick contact, international travel, hiking/camping.  Past medical history significant for PE on Eliquis , chronic venous insufficiency, GERD, recurrent DVT, abnormal uterine bleeding  Home Medications Prior to Admission medications   Medication Sig Start Date End Date Taking? Authorizing Provider  albuterol  (VENTOLIN  HFA) 108 (90 Base) MCG/ACT inhaler Inhale 1-2 puffs into the lungs every 6 (six) hours as needed for wheezing or shortness of breath. 02/08/22   Gretel Leaven, PA-C  apixaban  (ELIQUIS ) 5 MG TABS tablet Take 1 tablet (5 mg total) by mouth 2 (two) times daily. 06/12/23   Tish Forge, NP  azelastine (ASTELIN) 0.1 % nasal spray Place 2 sprays into both nostrils 2 (two) times daily as needed. 08/16/21   [provider]  cetirizine  (ZYRTEC  ALLERGY) 10 MG tablet Take 1 tablet (10 mg total) by mouth daily. 04/10/23   Rising, Ivette Marks, PA-C  cyclobenzaprine  (FLEXERIL ) 10 MG tablet Take 1 tablet (10 mg total) by mouth 3 (three) times daily as  needed for muscle spasms. 05/25/23   Zamora, Erin R, NP  fluticasone  (FLONASE ) 50 MCG/ACT nasal spray Place 2 sprays into both nostrils daily. 04/10/23   Rising, Ivette Marks, PA-C  HYDROcodone -acetaminophen  (NORCO/VICODIN) 5-325 MG tablet Take 1 tablet by mouth every 8 (eight) hours as needed for moderate pain (pain score 4-6). 06/13/23   Reuben Castilla, NP  lidocaine  (LIDODERM ) 5 % PLACE 1 PATCH ONTO THE SKIN DAILY. REMOVE & DISCARD PATCH WITHIN 12 HOURS OR AS DIRECTED BY MD 08/13/23   Timothy Ford, MD  lidocaine  (XYLOCAINE ) 5 % ointment Apply 1 Application topically as needed. 11/11/22   Raspet, Erin K, PA-C  loperamide  (IMODIUM ) 2 MG capsule Take 1 capsule (2 mg total) by mouth 4 (four) times daily as needed for diarrhea or loose stools. 09/13/23   Levora Reas A, NP  methocarbamol  (ROBAXIN ) 500 MG tablet Take 1 tablet (500 mg total) by mouth 2 (two) times daily. 11/11/22   Raspet, Erin K, PA-C  mirtazapine (REMERON) 15 MG tablet Take 15 mg by mouth at bedtime. 11/07/22   [provider]  ondansetron  (ZOFRAN -ODT) 4 MG disintegrating tablet Take 1 tablet (4 mg total) by mouth every 8 (eight) hours as needed for nausea or vomiting. 09/13/23   Levora Reas A, NP  pantoprazole  (PROTONIX ) 40 MG tablet Take 1 tablet (40 mg total) by mouth 2 (two) times daily. Patient taking differently: Take 40 mg by mouth 2 (two) times daily. 11/15/21   Graciella Lavender, PA  Rivaroxaban  15 & 20 MG TBPK Take as directed on package: Start with one 15mg  tablet by  mouth twice a day with food. On Day 22, switch to one 20mg  tablet once a day with food. Patient not taking: Reported on 04/02/2020 03/05/13 06/25/20  Nanavati, Ankit, MD      Allergies    Nsaids    Review of Systems   Review of Systems  Gastrointestinal:  Positive for abdominal pain.  All other systems reviewed and are negative.   Physical Exam Updated Vital Signs BP (!) 120/100 (BP Location: Right Arm)   Pulse 89   Temp 98.3 F (36.8 C)  (Oral)   Resp 20   Ht 5\' 11"  (1.803 m)   Wt 83 kg   SpO2 99%   BMI 25.52 kg/m  Physical Exam Vitals and nursing note reviewed.  Constitutional:      General: She is not in acute distress.    Appearance: She is well-developed.  HENT:     Head: Normocephalic and atraumatic.  Eyes:     Conjunctiva/sclera: Conjunctivae normal.  Cardiovascular:     Rate and Rhythm: Normal rate and regular rhythm.     Heart sounds: No murmur heard. Pulmonary:     Effort: Pulmonary effort is normal. No respiratory distress.     Breath sounds: Normal breath sounds.  Abdominal:     Palpations: Abdomen is soft.     Tenderness: There is abdominal tenderness in the right upper quadrant, epigastric area and suprapubic area. There is no right CVA tenderness or left CVA tenderness.  Musculoskeletal:        General: No swelling.     Cervical back: Neck supple.  Skin:    General: Skin is warm and dry.     Capillary Refill: Capillary refill takes less than 2 seconds.  Neurological:     Mental Status: She is alert.  Psychiatric:        Mood and Affect: Mood normal.     ED Results / Procedures / Treatments   Labs (all labs ordered are listed, but only abnormal results are displayed) Labs Reviewed  COMPREHENSIVE METABOLIC PANEL WITH GFR  CBC  LIPASE, BLOOD  PREGNANCY, URINE  URINALYSIS, ROUTINE W REFLEX MICROSCOPIC  HCG, QUANTITATIVE, PREGNANCY    EKG None  Radiology No results found.  Procedures Procedures    Medications Ordered in ED Medications - No data to display  ED Course/ Medical Decision Making/ A&P                                 Medical Decision Making Amount and/or Complexity of Data Reviewed Labs: ordered. Radiology: ordered.  Risk OTC drugs. Prescription drug management.   This patient presents to the ED for concern of abdominal pain, this involves an extensive number of treatment options, and is a complaint that carries with it a high risk of complications and  morbidity.  The differential diagnosis includes gastritis, PUD, cholecystitis, CBD pathology, SBO/LBO, volvulus, diverticulitis, appendicitis, pyelonephritis, nephrolithiasis, ectopic pregnancy, tubo-ovarian abscess, IUP, gastroenteritis, other   Co morbidities that complicate the patient evaluation  See HPI   Additional history obtained:  Additional history obtained from EMR External records from outside source obtained and reviewed including hospital records   Lab Tests:  I Ordered, and personally interpreted labs.  The pertinent results include: No electrolyte abnormalities besides slight hypocalcemia of 8.8.  No transaminitis.  No renal dysfunction.  Lipase within normal limits.  No leukocytosis.  No evidence of anemia.  Platelets within range.  hCG negative.  UA large leukocytes, 21-50 WBCs with no bacteria seen.   Imaging Studies ordered:  I ordered imaging studies including CT abdomen pelvis I independently visualized and interpreted imaging which showed no acute abnormality.  IUD in place. I agree with the radiologist interpretation   Cardiac Monitoring: / EKG:  The patient was maintained on a cardiac monitor.  I personally viewed and interpreted the cardiac monitored which showed an underlying rhythm of: Sinus rhythm   Consultations Obtained:  N/a   Problem List / ED Course / Critical interventions / Medication management  Abdominal pain, nausea, vomiting, diarrhea I ordered medication including 1 L of normal saline, Zofran , morphine    Reevaluation of the patient after these medicines showed that the patient improved I have reviewed the patients home medicines and have made adjustments as needed   Social Determinants of Health:  Chronic cigarette use.  Denies illicit drug use.   Test / Admission - Considered:  Abdominal pain, nausea, vomiting, diarrhea Vitals signs within normal range and stable throughout visit. Laboratory/imaging studies significant  for: See above 38 year old female presents emergency department with complaints of abdominal pain, nausea, diarrhea. Symptoms began around 3am yesterday morning.  And constant since onset.  Does report some "waxing and waning intensity" since symptom onset.  Does report 4-5 episodes of emesis yesterday with 20+ episodes of loose bowel movements.  Denies any hematemesis, hematochezia/melena.  Was seen in urgent care yesterday given Zofran , Imodium  diagnosed with viral gastroenteritis and states that symptoms have not improved very much since then.  Reports pain mainly in upper middle as well as right upper abdomen.  Denies any urinary symptoms, vaginal symptoms.  Denies any recent antibiotic use, known sick contact, international travel, hiking/camping. On exam, patient with mainly rather generalized abdominal tenderness without evidence of peritonitis clinically.  Labs obtained were unremarkable for any acute emergent process.  CT imaging negative for any acute abnormality.  Suspect patient's symptoms likely secondary to viral gastroenteritis versus foodborne illness.  Treated with medications and no significant improvement of symptoms on the ED.  Will continue to treat patient symptoms in the outpatient setting, recommend dietary/lifestyle changes as described in AVS and close follow-up with PCP in the outpatient setting.  Treatment plan discussed with patient and she acknowledged understanding was agreeable to said plan.  Patient overall well-appearing, afebrile, tolerating p.o. without difficulty, no acute distress upon discharge. Worrisome signs and symptoms were discussed with the patient, and the patient acknowledged understanding to return to the ED if noticed. Patient was stable upon discharge.          Final Clinical Impression(s) / ED Diagnoses Final diagnoses:  None    Rx / DC Orders ED Discharge Orders     None         [place] Butter, [place] 09/15/23 0935    Lind Repine,  MD 09/17/23 1527

## 2023-09-14 NOTE — ED Notes (Addendum)
 Pt given water, provider ordered... Feeling a little Nauseated... Not Vomiting... Provider aware.Monica AasAaron Holland

## 2023-10-16 HISTORY — PX: ARTHROSCOPY KNEE W/ DRILLING: SUR92

## 2023-11-05 ENCOUNTER — Ambulatory Visit: Admitting: Orthopedic Surgery

## 2023-12-11 ENCOUNTER — Encounter: Payer: Self-pay | Admitting: *Deleted

## 2023-12-11 NOTE — Progress Notes (Signed)
 This nurse faxed a surgical clearance request to [facility]. Fax # [phone]. Per Dr. Timmy, HOLD Eliquis  for two days prior to procedure, then restart one day after.

## 2023-12-17 HISTORY — PX: ROTATOR CUFF REPAIR: SHX139

## 2024-01-20 IMAGING — CT CT ABD-PELV W/ CM
2 of 4 series · 16 of 46 positions shown, 18 images · IV contrast (OMNIPAQUE 300)
Comparison: None.

CLINICAL DATA: Acute abdominal pain beginning last night.

EXAM:
CT ABDOMEN AND PELVIS WITH CONTRAST
TECHNIQUE: Multidetector CT imaging of the abdomen and pelvis was performed
using the standard protocol following bolus administration of
intravenous contrast.

[Series 2: axial st · axial · 0.82mm/px · z∈[+1100,+1500]mm · 13 of 90 slices shown, 15 images]
[im 5/90  soft-tissue]
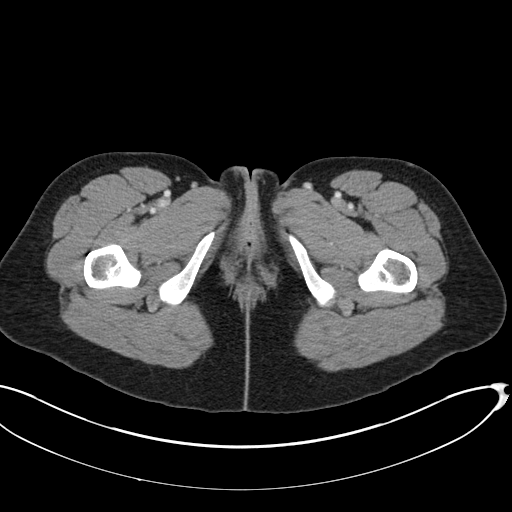
[im 5/90  bone]
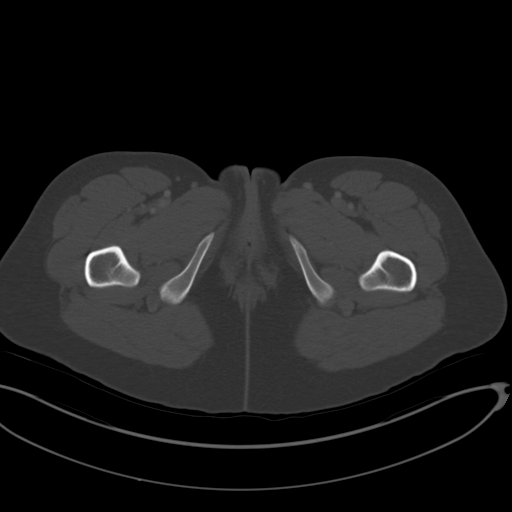
[im 14/90  soft-tissue]
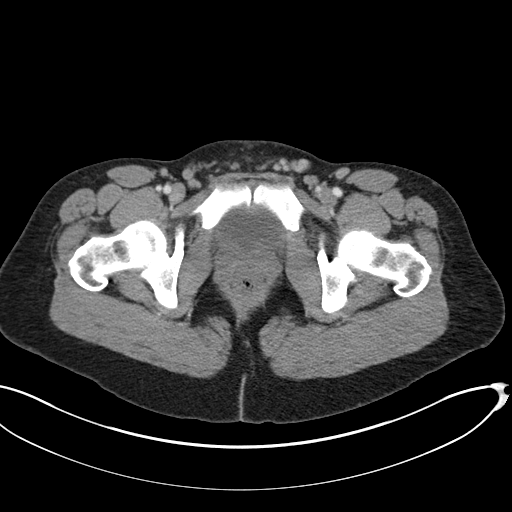
[im 18/90  soft-tissue]
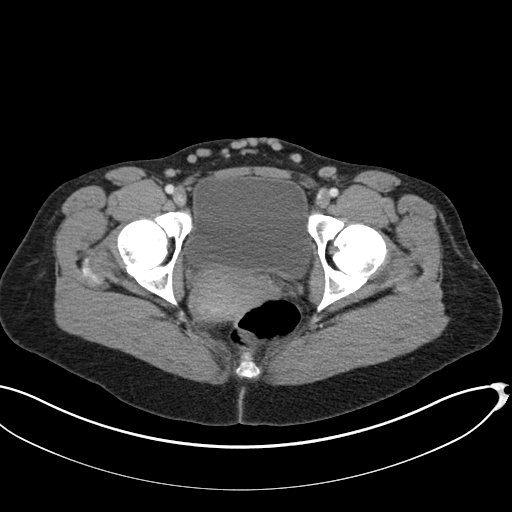
[im 27/90  soft-tissue]
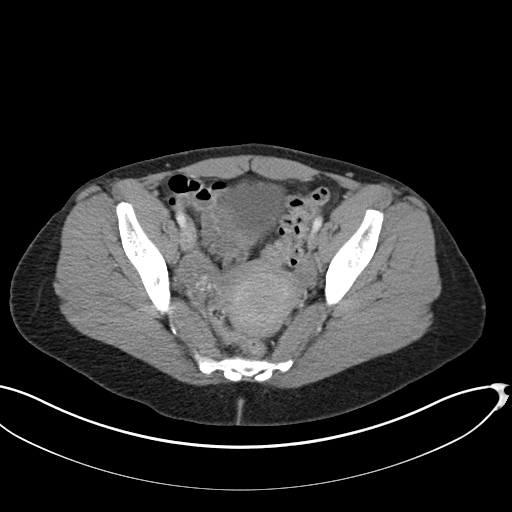
[im 32/90  soft-tissue]
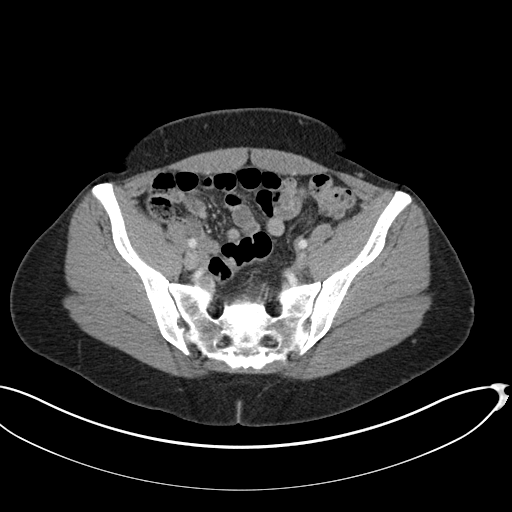
[im 41/90  soft-tissue]
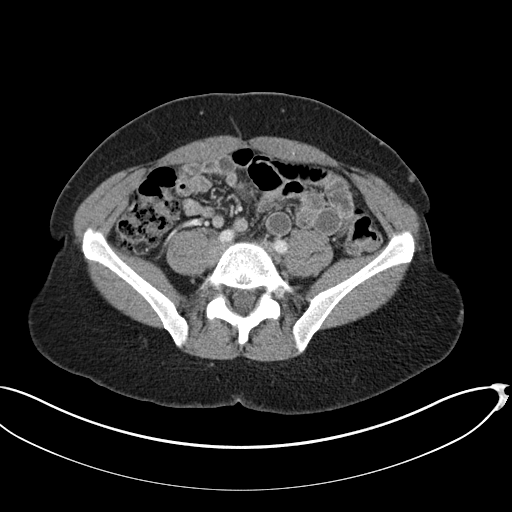
[im 45/90  soft-tissue]
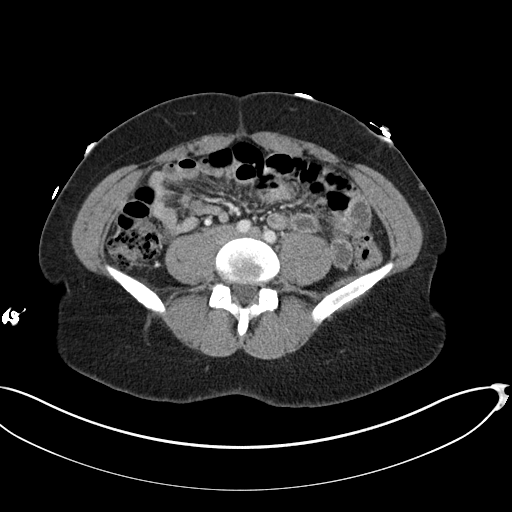
[im 49/90  soft-tissue]
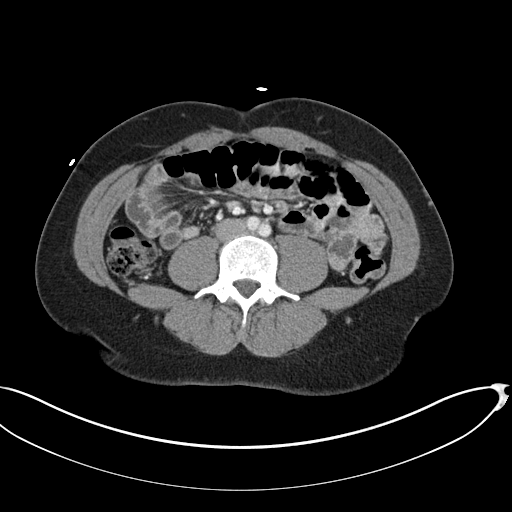
[im 58/90  soft-tissue]
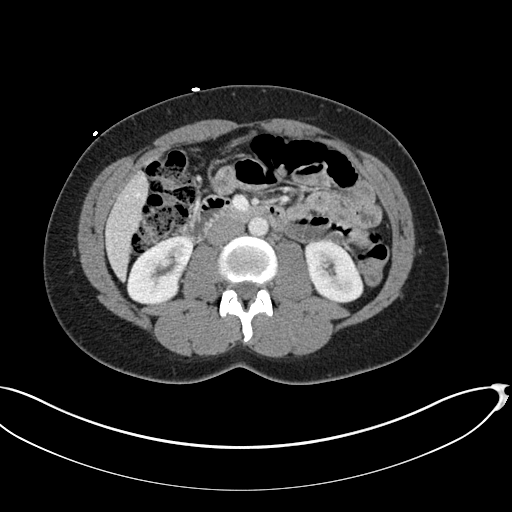
[im 58/90  bone]
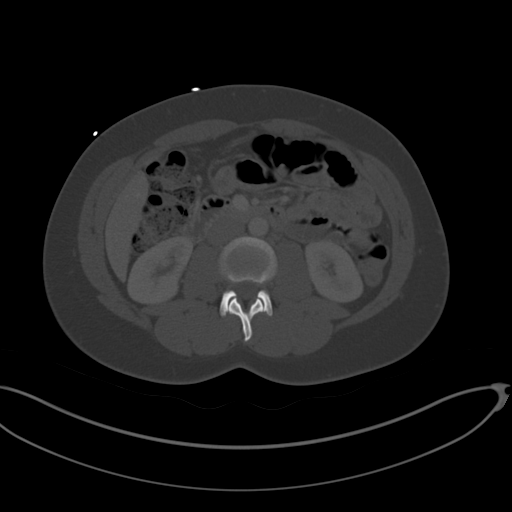
[im 63/90  soft-tissue]
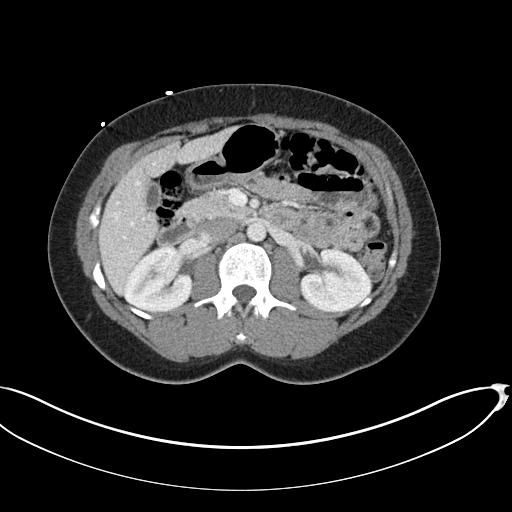
[im 72/90  soft-tissue]
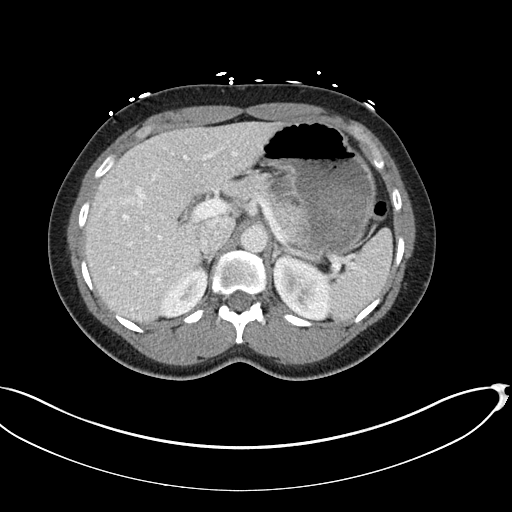
[im 76/90  soft-tissue]
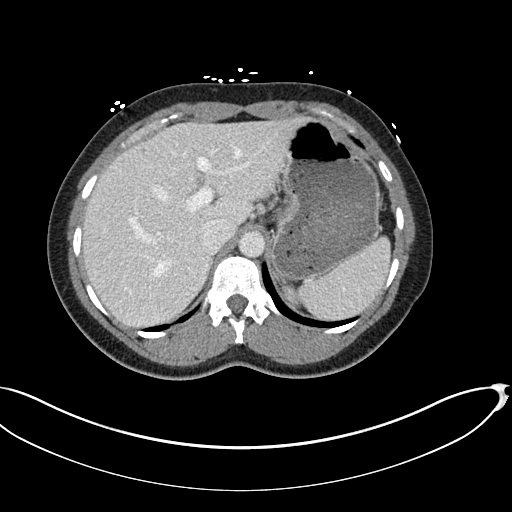
[im 85/90  soft-tissue]
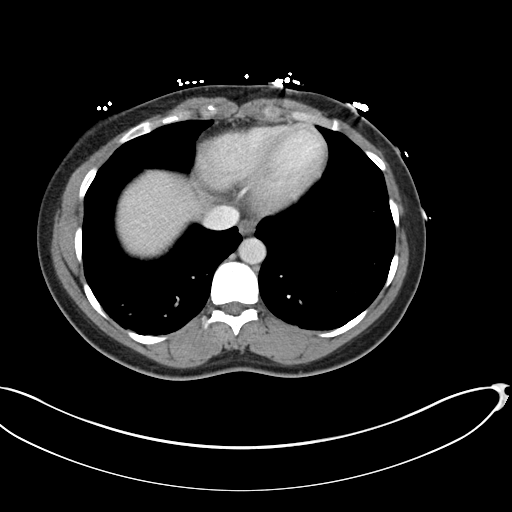

[Series 5: coronal st · coronal · 0.67mm/px · 3 of 150 slices shown]
[im 50/150  soft-tissue]
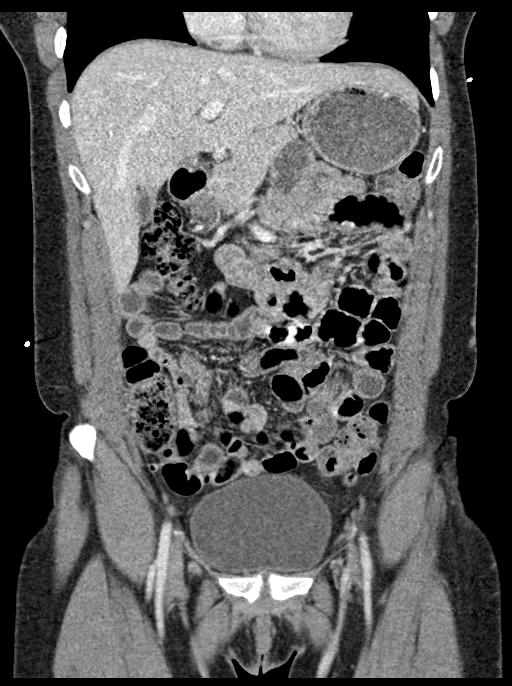
[im 67/150  soft-tissue]
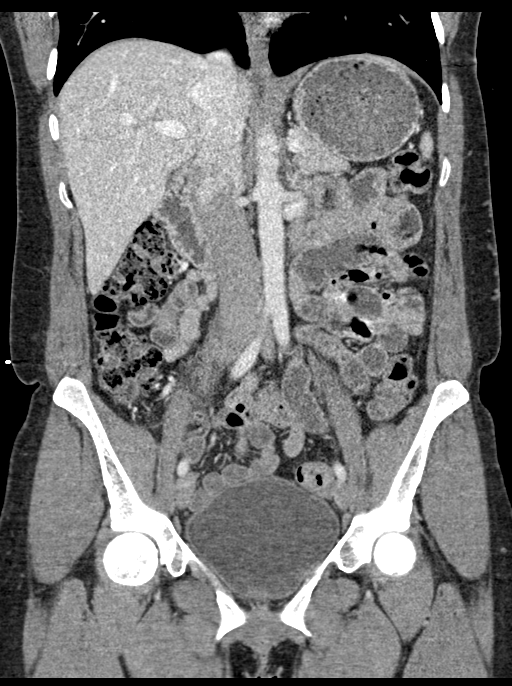
[im 83/150  soft-tissue]
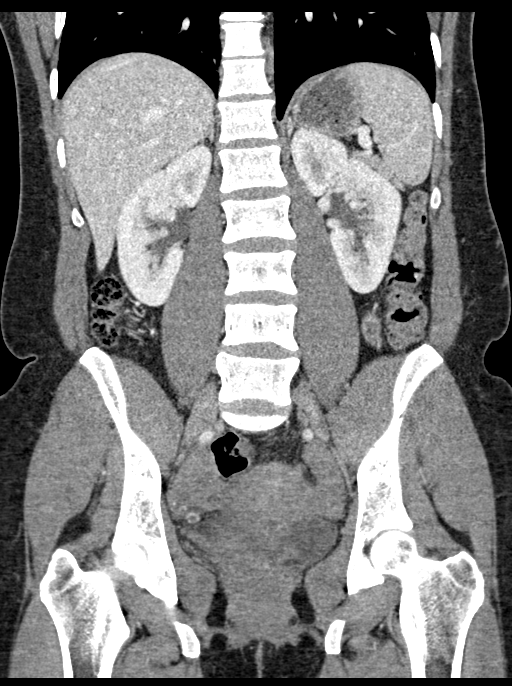

[16 of 46 positions shown; findings below may reference images not displayed]

RADIATION DOSE REDUCTION: This exam was performed according to the
departmental dose-optimization program which includes automated
exposure control, adjustment of the mA and/or kV according to
patient size and/or use of iterative reconstruction technique.

CONTRAST:  100mL OMNIPAQUE IOHEXOL 300 MG/ML  SOLN
FINDINGS: Lower Chest: No acute findings.

Hepatobiliary: No hepatic masses identified. Gallbladder is
unremarkable. No evidence of biliary ductal dilatation.

Pancreas:  No mass or inflammatory changes.

Spleen: Within normal limits in size and appearance.

Adrenals/Urinary Tract: No masses identified. No evidence of
ureteral calculi or hydronephrosis.

Stomach/Bowel: No evidence of obstruction, inflammatory process or
abnormal fluid collections. Normal appendix visualized.

Vascular/Lymphatic: No pathologically enlarged lymph nodes. No acute
vascular findings. Dilated veins in the suprapubic subcutaneous soft
tissues remain stable.

Reproductive: No mass or inflammatory process identified. Tiny
amount of free fluid is again seen in the pelvic cul-de-sac, most
likely physiologic in a reproductive age female.

Other:  None.

Musculoskeletal:  No suspicious bone lesions identified.
IMPRESSION: No acute findings or other significant abnormality within the
abdomen or pelvis.

## 2024-01-24 ENCOUNTER — Inpatient Hospital Stay: Attending: Family

## 2024-02-18 ENCOUNTER — Encounter: Payer: Self-pay | Admitting: Radiology

## 2024-03-11 ENCOUNTER — Other Ambulatory Visit: Payer: Self-pay | Admitting: Nurse Practitioner

## 2024-03-11 DIAGNOSIS — N61 Mastitis without abscess: Secondary | ICD-10-CM

## 2024-03-17 ENCOUNTER — Emergency Department (HOSPITAL_COMMUNITY)

## 2024-03-17 ENCOUNTER — Emergency Department (HOSPITAL_COMMUNITY)
Admission: EM | Admit: 2024-03-17 | Discharge: 2024-03-17 | Disposition: A | Discharge status: Home / Self Care | Attending: Emergency Medicine | Admitting: Emergency Medicine

## 2024-03-17 ENCOUNTER — Other Ambulatory Visit: Payer: Self-pay

## 2024-03-17 DIAGNOSIS — M25512 Pain in left shoulder: Secondary | ICD-10-CM | POA: Insufficient documentation

## 2024-03-17 DIAGNOSIS — Z7901 Long term (current) use of anticoagulants: Secondary | ICD-10-CM | POA: Insufficient documentation

## 2024-03-17 DIAGNOSIS — M791 Myalgia, unspecified site: Secondary | ICD-10-CM | POA: Insufficient documentation

## 2024-03-17 DIAGNOSIS — M7918 Myalgia, other site: Secondary | ICD-10-CM

## 2024-03-17 DIAGNOSIS — J9811 Atelectasis: Secondary | ICD-10-CM | POA: Insufficient documentation

## 2024-03-17 DIAGNOSIS — R519 Headache, unspecified: Secondary | ICD-10-CM | POA: Insufficient documentation

## 2024-03-17 DIAGNOSIS — N8301 Follicular cyst of right ovary: Secondary | ICD-10-CM | POA: Insufficient documentation

## 2024-03-17 DIAGNOSIS — Y92410 Unspecified street and highway as the place of occurrence of the external cause: Secondary | ICD-10-CM | POA: Insufficient documentation

## 2024-03-17 DIAGNOSIS — Z86718 Personal history of other venous thrombosis and embolism: Secondary | ICD-10-CM | POA: Insufficient documentation

## 2024-03-17 LAB — CBC WITH DIFFERENTIAL/PLATELET
Abs Immature Granulocytes: 0.01 K/uL (ref 0.00–0.07)
Basophils Absolute: 0 K/uL (ref 0.0–0.1)
Basophils Relative: 1 %
Eosinophils Absolute: 0.1 K/uL (ref 0.0–0.5)
Eosinophils Relative: 2 %
HCT: 45 % (ref 36.0–46.0)
Hemoglobin: 14.6 g/dL (ref 12.0–15.0)
Immature Granulocytes: 0 %
Lymphocytes Relative: 43 %
Lymphs Abs: 2.3 K/uL (ref 0.7–4.0)
MCH: 27.7 pg (ref 26.0–34.0)
MCHC: 32.4 g/dL (ref 30.0–36.0)
MCV: 85.2 fL (ref 80.0–100.0)
Monocytes Absolute: 0.4 K/uL (ref 0.1–1.0)
Monocytes Relative: 7 %
Neutro Abs: 2.5 K/uL (ref 1.7–7.7)
Neutrophils Relative %: 47 %
Platelets: 231 K/uL (ref 150–400)
RBC: 5.28 MIL/uL — ABNORMAL HIGH (ref 3.87–5.11)
RDW: 15.9 % — ABNORMAL HIGH (ref 11.5–15.5)
WBC: 5.3 K/uL (ref 4.0–10.5)
nRBC: 0 % (ref 0.0–0.2)

## 2024-03-17 LAB — I-STAT CHEM 8, ED
BUN: 8 mg/dL (ref 6–20)
Calcium, Ion: 1.19 mmol/L (ref 1.15–1.40)
Chloride: 100 mmol/L (ref 98–111)
Creatinine, Ser: 1 mg/dL (ref 0.44–1.00)
Glucose, Bld: 101 mg/dL — ABNORMAL HIGH (ref 70–99)
HCT: 47 % — ABNORMAL HIGH (ref 36.0–46.0)
Hemoglobin: 16 g/dL — ABNORMAL HIGH (ref 12.0–15.0)
Potassium: 4.3 mmol/L (ref 3.5–5.1)
Sodium: 140 mmol/L (ref 135–145)
TCO2: 27 mmol/L (ref 22–32)

## 2024-03-17 LAB — BASIC METABOLIC PANEL WITH GFR
Anion gap: 9 (ref 5–15)
BUN: 8 mg/dL (ref 6–20)
CO2: 28 mmol/L (ref 22–32)
Calcium: 9 mg/dL (ref 8.9–10.3)
Chloride: 102 mmol/L (ref 98–111)
Creatinine, Ser: 0.95 mg/dL (ref 0.44–1.00)
GFR, Estimated: >60 mL/min (ref >60)
Glucose, Bld: 107 mg/dL — ABNORMAL HIGH (ref 70–99)
Potassium: 4.2 mmol/L (ref 3.5–5.1)
Sodium: 139 mmol/L (ref 135–145)

## 2024-03-17 LAB — HCG, SERUM, QUALITATIVE: Preg, Serum: NEGATIVE

## 2024-03-17 MED ORDER — LIDOCAINE 5 % EX PTCH: 1.0000 | MEDICATED_PATCH | CUTANEOUS | 0 refills | Status: DC

## 2024-03-17 MED ORDER — TIZANIDINE HCL 2 MG PO TABS: 2.0000 mg | ORAL_TABLET | Freq: Four times a day (QID) | ORAL | 0 refills | Status: DC | PRN

## 2024-03-17 MED ORDER — TIZANIDINE HCL 2 MG PO TABS: 2.0000 mg | ORAL_TABLET | Freq: Three times a day (TID) | ORAL | 0 refills | Status: DC | PRN

## 2024-03-17 MED ORDER — ONDANSETRON HCL 4 MG/2ML IJ SOLN
4.0000 mg | Freq: Once | INTRAMUSCULAR | Status: AC
  Administered 2024-03-17: 4 mg via INTRAVENOUS
  Filled 2024-03-17: qty 2

## 2024-03-17 MED ORDER — MORPHINE SULFATE (PF) 4 MG/ML IV SOLN
4.0000 mg | Freq: Once | INTRAVENOUS | Status: AC
  Administered 2024-03-17: 4 mg via INTRAVENOUS
  Filled 2024-03-17: qty 1

## 2024-03-17 MED ORDER — OXYCODONE-ACETAMINOPHEN 5-325 MG PO TABS
1.0000 | ORAL_TABLET | Freq: Once | ORAL | Status: AC
  Administered 2024-03-17: 1 via ORAL
  Filled 2024-03-17: qty 1

## 2024-03-17 MED ORDER — IOHEXOL 350 MG/ML SOLN
75.0000 mL | Freq: Once | INTRAVENOUS | Status: AC | PRN
  Administered 2024-03-17: 75 mL via INTRAVENOUS

## 2024-03-17 NOTE — ED Notes (Signed)
Pt ambulated tolerated well  
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
## 2024-03-19 ENCOUNTER — Ambulatory Visit
Admission: RE | Admit: 2024-03-19 | Discharge: 2024-03-19 | Disposition: A | Source: Ambulatory Visit | Attending: Nurse Practitioner | Admitting: Nurse Practitioner

## 2024-03-19 ENCOUNTER — Other Ambulatory Visit: Payer: Self-pay | Admitting: Nurse Practitioner

## 2024-03-19 DIAGNOSIS — N6321 Unspecified lump in the left breast, upper outer quadrant: Secondary | ICD-10-CM

## 2024-03-19 DIAGNOSIS — N6323 Unspecified lump in the left breast, lower outer quadrant: Secondary | ICD-10-CM

## 2024-03-19 DIAGNOSIS — N61 Mastitis without abscess: Secondary | ICD-10-CM

## 2024-03-19 DIAGNOSIS — R599 Enlarged lymph nodes, unspecified: Secondary | ICD-10-CM

## 2024-03-19 HISTORY — PX: BREAST BIOPSY: SHX20

## 2024-03-20 LAB — SURGICAL PATHOLOGY

## 2024-03-21 ENCOUNTER — Telehealth: Payer: Self-pay | Admitting: *Deleted

## 2024-03-21 NOTE — Telephone Encounter (Signed)
 Spoke to patient to confirm upcoming afternoon [facility] clinic appointment on 12/10, paperwork will be sent via email [phone])  Gave location and time, also informed patient that the surgeon's office would be calling as well to get information from them similar to the packet that they will be receiving so make sure to do both.  Reminded patient that all providers will be coming to the clinic to see them HERE and if they had any questions to not hesitate to reach back out to myself or their navigators.

## 2024-03-24 ENCOUNTER — Encounter: Payer: Self-pay | Admitting: *Deleted

## 2024-03-24 DIAGNOSIS — Z170 Estrogen receptor positive status [ER+]: Secondary | ICD-10-CM | POA: Insufficient documentation

## 2024-03-25 ENCOUNTER — Encounter: Payer: Self-pay | Admitting: Genetic Counselor

## 2024-03-25 NOTE — Progress Notes (Signed)
 Radiation Oncology         [phone] ________________________________  Multidisciplinary Breast Oncology Clinic [facility]) Initial Outpatient Consultation  Name: Monica Holland MRN: [id number]  Date: 03/26/2024  DOB: [birth year]  RR:Inboz, Laneta, MD  Curvin [name], MD   REFERRING PHYSICIAN: Curvin Deward III, MD  DIAGNOSIS: There were no encounter diagnoses.  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  HISTORY OF PRESENT ILLNESS::Monica Holland is a 38 y.o. female who is presenting to the office today for evaluation of her newly diagnosed left breast cancer. She is accompanied by ***. She is doing well overall.   She presented to medical attention earlier this month with a 2 week history of a palpable lump in the outer left breast associated with erythema. She accordingly underwent bilateral diagnostic mammography with tomography and left  breast ultrasonography at [facility] on 03/19/24 showing: a dominant palpable mass in the 2 o'clock left breast located 6 cmfn, measuring 3.5 cm in the greatest extent, an additional satellite mas in the 4 o'clock left breast located 4 cmfn, measuring approximately 1.3 cm in the greatest extent, two other additional satellite masses in the 12 o'clock subareolar left breast, the largest of which measuring approximately 1.1 cm and the smaller of which measuring 0.5 cm in the greatest extent, and at least two abnormal left axillary lymph nodes, the largest of which measuring 2.5 cm and with a cortical thickening of up to 0.7 cm. Of note: Breast exam performed at the time of this study confirmed a 3-4 cm palpable mass ini the upper outer left breast, corresponding to the dominant mass, and a firm 1 cm palpable mass in the lower outer left breast corresponding to the 4 o'clock satellite mass. Erythema involving the skin of the outer and  lower left breast was also appreciated at the time of this examination. Imaging otherwise showed no evidence of malignancy in the right breast.   Biopsies collected on 03/19/24 are detailed as follows:  -- Biopsy of the 2 o'clock left breast showed: grade 2 invasive ductal carcinoma measuring 1.1 cm in the greatest linear extent of the sample; negative for LVI. Prognostic indicators significant for: estrogen receptor 90% positive with moderate-strong staining intensity; progesterone receptor 100% positive with strong staining intensity; Proliferation marker Ki67 at 15%; Her2 status negative; Grade 2.  -- Biopsy of the 4 o'clock left breast showed findings consistent with an intramammary lymph node involved by invasive ductal carcinoma, measuring 0.6 cm in the greatest linear extent of the sample.  -- Biopsy of one of the abnormal left axillary lymph nodes showed metastatic carcinoma, with a metastatic focus measuring approximately 0.5 cm in the greatest linear extent of the sample.  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    Menarche: *** years old Age at first live birth: *** years old GP: *** LMP: *** Contraceptive: *** HRT: ***   The patient was referred today for presentation in the multidisciplinary conference.  Radiology studies and pathology slides were presented there for review and discussion of treatment options.  A consensus was discussed regarding potential next steps.  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   Wears glasses     PAST SURGICAL HISTORY: Past Surgical History:  Procedure Laterality Date   ANKLE SURGERY Right 2014   BREAST BIOPSY Left 03/19/2024   US  LT BREAST BX W LOC DEV EA ADD LESION IMG BX SPEC US  GUIDE 03/19/2024 [facility]   BREAST BIOPSY Left 03/19/2024   US  LT BREAST BX W LOC DEV 1ST LESION IMG BX SPEC US  GUIDE 03/19/2024 [facility]   BREAST BIOPSY Left 03/19/2024   US  LT BREAST BX W LOC DEV EA ADD LESION IMG BX SPEC US  GUIDE 03/19/2024 [facility]   DILITATION & CURRETTAGE/HYSTROSCOPY WITH NOVASURE ABLATION N/A 01/04/2022   Procedure: DILATATION & CURETTAGE/HYSTEROSCOPY WITH NOVASURE ABLATION;  Surgeon: Cleatus Moccasin, MD;  Location: [facility];  Service: Gynecology;  Laterality: N/A;   ESOPHAGOGASTRODUODENOSCOPY (EGD) WITH PROPOFOL   12/29/2021   by dr federico   INTRAUTERINE DEVICE (IUD) INSERTION N/A 01/04/2022   Procedure: INTRAUTERINE DEVICE (IUD) INSERTION;  Surgeon: Cleatus Moccasin, MD;  Location: [facility] [address];  Service: Gynecology;  Laterality: N/A;    FAMILY HISTORY:  Family History  Problem Relation Age of Onset   Deep vein thrombosis Mother    Hyperlipidemia Mother    Diabetes Mother    High Cholesterol Mother    Colon cancer Paternal Grandmother    Stomach cancer Neg Hx    Esophageal cancer Neg Hx    Rectal cancer Neg Hx     SOCIAL HISTORY:  Social History   Socioeconomic History   Marital status: Significant Other    Spouse name: Not on  file   Number of children: Not on file   Years of education: Not on file   Highest education level: Not on file  Occupational History   Occupation: machine tech  Tobacco Use   Smoking status: Every Day    Types: Cigarettes   Smokeless tobacco: Never   Tobacco comments:    12-30-2021  per pt average 1-2 cig per day down from 1/ 2ppd  Vaping Use   Vaping status: Never Used  Substance and Sexual Activity   Alcohol use: Yes    Alcohol/week: 3.0 standard drinks of alcohol    Types: 3 Cans of beer per week   Drug use: Yes    Types: Marijuana    Comment: 01/03/2022   Sexual activity: Yes    Birth control/protection: None  Other Topics Concern   Not on file  Social History Narrative   Not on file   Social Drivers of Health   Financial Resource Strain: Not on file  Food Insecurity: No Food Insecurity (10/26/2021)   Hunger Vital Sign    Worried About Running Out of Food in  the Last Year: Never true    Ran Out of Food in the Last Year: Never true  Transportation Needs: No Transportation Needs (10/26/2021)   PRAPARE - Administrator, Civil Service (Medical): No    Lack of Transportation (Non-Medical): No  Physical Activity: Not on file  Stress: Not on file (02/22/2023)  Social Connections: Not on file    ALLERGIES:  Allergies  Allergen Reactions   Nsaids Other (See Comments)    DVTs    MEDICATIONS:  Current Outpatient Medications  Medication Sig Dispense Refill   albuterol  (VENTOLIN  HFA) 108 (90 Base) MCG/ACT inhaler Inhale 1-2 puffs into the lungs every 6 (six) hours as needed for wheezing or shortness of breath. 8 g 0   apixaban  (ELIQUIS ) 5 MG TABS tablet Take 1 tablet (5 mg total) by mouth 2 (two) times daily. 60 tablet 3   azelastine (ASTELIN) 0.1 % nasal spray Place 2 sprays into both nostrils 2 (two) times daily as needed.     cetirizine  (ZYRTEC  ALLERGY) 10 MG tablet Take 1 tablet (10 mg total) by mouth daily. 30 tablet 2   cyclobenzaprine  (FLEXERIL ) 10 MG  tablet Take 1 tablet (10 mg total) by mouth 3 (three) times daily as needed for muscle spasms. 30 tablet 0   dicyclomine  (BENTYL ) 20 MG tablet Take 1 tablet (20 mg total) by mouth 2 (two) times daily as needed. 20 tablet 0   fluticasone  (FLONASE ) 50 MCG/ACT nasal spray Place 2 sprays into both nostrils daily. 9.9 mL 2   HYDROcodone -acetaminophen  (NORCO/VICODIN) 5-325 MG tablet Take 1 tablet by mouth every 8 (eight) hours as needed for moderate pain (pain score 4-6). 21 tablet 0   lidocaine  (LIDODERM ) 5 % PLACE 1 PATCH ONTO THE SKIN DAILY. REMOVE & DISCARD PATCH WITHIN 12 HOURS OR AS DIRECTED BY MD 30 patch 0   lidocaine  (LIDODERM ) 5 % Place 1 patch onto the skin daily. Remove & Discard patch within 12 hours or as directed by MD 30 patch 0   lidocaine  (XYLOCAINE ) 5 % ointment Apply 1 Application topically as needed. 35.44 g 0   loperamide  (IMODIUM ) 2 MG capsule Take 1 capsule (2 mg total) by mouth 4 (four) times daily as needed for diarrhea or loose stools. 12 capsule 0   methocarbamol  (ROBAXIN ) 500 MG tablet Take 1 tablet (500 mg total) by mouth 2 (two) times daily. 20 tablet 0   mirtazapine (REMERON) 15 MG tablet Take 15 mg by mouth at bedtime.     ondansetron  (ZOFRAN -ODT) 4 MG disintegrating tablet Take 1 tablet (4 mg total) by mouth every 8 (eight) hours as needed for nausea or vomiting. 10 tablet 0   pantoprazole  (PROTONIX ) 40 MG tablet Take 1 tablet (40 mg total) by mouth 2 (two) times daily. (Patient taking differently: Take 40 mg by mouth 2 (two) times daily.) 60 tablet 5   promethazine  (PHENERGAN ) 25 MG suppository Place 1 suppository (25 mg total) rectally every 6 (six) hours as needed for nausea or vomiting. 12 each 0   promethazine  (PHENERGAN ) 25 MG tablet Take 1 tablet (25 mg total) by mouth every 6 (six) hours as needed for nausea or vomiting. 30 tablet 0   tiZANidine  (ZANAFLEX ) 2 MG tablet Take 1 tablet (2 mg total) by mouth every 8 (eight) hours as needed for muscle spasms. 30 tablet 0    No current facility-administered medications for this encounter.    REVIEW OF SYSTEMS: A 10+ POINT REVIEW OF SYSTEMS WAS OBTAINED including neurology, dermatology,  psychiatry, cardiac, respiratory, lymph, extremities, GI, GU, musculoskeletal, constitutional, reproductive, HEENT. On the provided form, she reports ***. She denies *** and any other symptoms.    PHYSICAL EXAM:  vitals were not taken for this visit.  {may need to copy over vitals} Lungs are clear to auscultation bilaterally. Heart has regular rate and rhythm. No palpable cervical, supraclavicular, or axillary adenopathy. Abdomen soft, non-tender, normal bowel sounds. Breast: Right breast with no palpable mass, nipple discharge, or bleeding. Left breast with ***.   KPS = ***  100 - Normal; no complaints; no evidence of disease. 90   - Able to carry on normal activity; minor signs or symptoms of disease. 80   - Normal activity with effort; some signs or symptoms of disease. 96   - Cares for self; unable to carry on normal activity or to do active work. 60   - Requires occasional assistance, but is able to care for most of his personal needs. 50   - Requires considerable assistance and frequent medical care. 40   - Disabled; requires special care and assistance. 30   - Severely disabled; hospital admission is indicated although death not imminent. 20   - Very sick; hospital admission necessary; active supportive treatment necessary. 10   - Moribund; fatal processes progressing rapidly. 0     - Dead  Karnofsky DA, Abelmann WH, Craver LS and Burchenal [facility] [phone]) The use of the nitrogen mustards in the palliative treatment of carcinoma: with particular reference to bronchogenic carcinoma Cancer 1 [id number]  LABORATORY DATA:  Lab Results  Component Value Date   WBC 5.3 03/17/2024   HGB 16.0 (H) 03/17/2024   HCT 47.0 (H) 03/17/2024   MCV 85.2 03/17/2024   PLT 231 03/17/2024   Lab Results  Component Value Date   NA 140  03/17/2024   K 4.3 03/17/2024   CL 100 03/17/2024   CO2 28 03/17/2024   Lab Results  Component Value Date   ALT 11 09/14/2023   AST 15 09/14/2023   ALKPHOS 58 09/14/2023   BILITOT 0.6 09/14/2023    PULMONARY FUNCTION TEST:   Review Flowsheet        No data to display          RADIOGRAPHY: US  LT BREAST BX W LOC DEV 1ST LESION IMG BX SPEC US  GUIDE Addendum Date: 03/20/2024 ADDENDUM REPORT: 03/20/2024 11:21 ADDENDUM: Pathology revealed: Site 1. 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 LYMPHOVASCULAR INVASION: NOT IDENTIFIED. CANCER LENGTH: 11 MM / 1.1 CM. CALCIFICATIONS: NOT IDENTIFIED. Site 2. Breast, left 4 o'clock (coil clip) INTRAMAMMARY LYMPH NODE, INVOLVED BY INVASIVE DUCTAL CARCINOMA (1/1). CANCER LENGTH: 6 MM / 0.6 CM. Site 3. 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 NEGATIVE FOR ATYPIA OR MALIGNANCY. Site 4. 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 METASTATIC FOCUS: 5 MM /0.5 CM. All sites were found to be concordant by Dr. Madeleine Satterfield. RECOMMENDATION: 1. Breast MRI to evaluate extent of disease. 2. Surgical and oncological consultation. The patient was referred to [facility] at [facility] with appointment on Dec. 10, 2025. Pathology results were discussed with the patient by telephone. The patient reported doing well after the biopsy with tenderness at the site. Post biopsy instructions and care were reviewed and questions were answered. The patient was encouraged to call [facility] for any additional concerns. Addendum dictated by Crissie  Beane R.T. (R)(M) Electronically Signed   By: Debby Satterfield M.D.   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 Dominant 3.5 cm  mass with calcifications at 2 o'clock. Satellite 1.3 cm mass at 4 o'clock. Adjacent satellite masses at 12 o'clock, the largest measuring 1.1 cm. At least 2 abnormal LEFT axillary lymph nodes. EXAM: ULTRASOUND GUIDED LEFT BREAST CORE NEEDLE BIOPSY x 3 ULTRASOUND GUIDED LEFT AXILLARY LYMPH NODE CORE NEEDLE BIOPSY COMPARISON:  Previous exam(s). PROCEDURE: I met with the patient and we discussed the procedure of ultrasound-guided biopsy, including benefits and alternatives. We discussed the high likelihood of a successful procedure. We discussed the risks of the procedure, including infection, bleeding, tissue injury, clip migration, and inadequate sampling. Informed written consent was given. The usual time-out protocol was performed immediately prior to the procedure. #1) Mass, 2 o'clock, lesion quadrant: UPPER OUTER QUADRANT. Using sterile technique with chlorhexidine as skin antisepsis, 1% lidocaine  and 1% lidocaine  with epinephrine  as local anesthetic, under direct ultrasound visualization, a 12 gauge Bard Marquee core needle device placed through an 11 gauge introducer needle was used to perform biopsy of the mass at 2 o'clock using a lateral approach. At the conclusion of the procedure, a ribbon shaped tissue marker clip was deployed into the biopsy cavity. # 2) Mass, 4 o'clock, lesion quadrant: LOWER OUTER QUADRANT. Using sterile technique with chlorhexidine as skin antisepsis, 1% lidocaine  and 1% lidocaine  with epinephrine  as local anesthetic, under direct ultrasound visualization, a 14 gauge Bard Marquee core needle device placed through a 13 gauge introducer needle was used perform biopsy of the mass at 4 o'clock using a lateral approach. At the conclusion of the procedure, a coil shaped tissue marker clip was deployed into the biopsy cavity. # 3) Mass, 12 o'clock location: Using sterile technique with chlorhexidine as skin antisepsis, 1% lidocaine  and 1% lidocaine  with epinephrine  as local anesthetic, under  direct ultrasound visualization, a 14 gauge Bard Marquee core needle device placed through a 13 gauge introducer needle was used perform biopsy of the mass at 12 o'clock using a lateral approach. At the conclusion of the procedure, a heart shaped tissue marker clip was deployed into the biopsy cavity. # 4) Axillary lymph node: Using sterile technique with chlorhexidine as skin antisepsis, 1% lidocaine  and 1% lidocaine  with epinephrine  as local anesthetic, under direct ultrasound visualization, a 14 gauge Bard Marquee core needle device placed through a 13 gauge introducer needle was used perform biopsy of the abnormal lymph node using an inferolateral approach. At the conclusion of the procedure, a Venus shaped tissue marker clip was deployed into the biopsy cavity. The patient tolerated the procedures well without apparent immediate complications. Follow up 2 view mammogram was performed in order to confirm clip placement and was dictated separately. IMPRESSION: 1. Ultrasound-guided core needle biopsy of a dominant mass in the UPPER OUTER QUADRANT of the LEFT breast at 2 o'clock. 2. Ultrasound-guided core needle biopsy of a satellite mass in the LOWER OUTER QUADRANT of the LEFT breast at 4 o'clock. 3. Ultrasound-guided core needle biopsy of a satellite mass in the upper retroareolar LEFT breast at 12 o'clock. 4. Ultrasound-guided core needle biopsy of an abnormal LEFT axillary lymph node. Electronically Signed: By: Debby Satterfield M.D. On: 03/19/2024 17:17   US  LT BREAST BX W LOC DEV EA ADD LESION IMG BX SPEC US  GUIDE Addendum Date: 03/20/2024 ADDENDUM REPORT: 03/20/2024 11:21 ADDENDUM: Pathology revealed:  Site 1. 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 LYMPHOVASCULAR INVASION: NOT IDENTIFIED. CANCER LENGTH: 11 MM / 1.1 CM. CALCIFICATIONS: NOT IDENTIFIED. Site 2. Breast, left 4 o'clock (coil clip) INTRAMAMMARY LYMPH NODE, INVOLVED BY INVASIVE DUCTAL CARCINOMA (1/1).  CANCER LENGTH: 6 MM / 0.6 CM. Site 3. 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 NEGATIVE FOR ATYPIA OR MALIGNANCY. Site 4. 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 METASTATIC FOCUS: 5 MM /0.5 CM. All sites were found to be concordant by Dr. Madeleine Satterfield. RECOMMENDATION: 1. Breast MRI to evaluate extent of disease. 2. Surgical and oncological consultation. The patient was referred to [facility] at [facility] with appointment on Dec. 10, 2025. Pathology results were discussed with the patient by telephone. The patient reported doing well after the biopsy with tenderness at the site. Post biopsy instructions and care were reviewed and questions were answered. The patient was encouraged to call [facility] for any additional concerns. Addendum dictated by Conard Billing R.T. (R)(M) Electronically Signed   By: Debby Satterfield M.D.   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 Dominant 3.5 cm mass with calcifications at 2 o'clock. Satellite 1.3 cm mass at 4 o'clock. Adjacent satellite masses at 12 o'clock, the largest measuring 1.1 cm. At least 2 abnormal LEFT axillary lymph nodes. EXAM: ULTRASOUND GUIDED LEFT BREAST CORE NEEDLE BIOPSY x 3 ULTRASOUND GUIDED LEFT AXILLARY LYMPH NODE CORE NEEDLE BIOPSY COMPARISON:  Previous exam(s). PROCEDURE: I met with the patient and we discussed the procedure of ultrasound-guided biopsy, including benefits and alternatives. We discussed the high likelihood of a successful procedure. We discussed the risks of the procedure, including infection, bleeding, tissue injury, clip migration, and inadequate sampling. Informed written consent was given. The usual time-out protocol was performed immediately prior to the  procedure. #1) Mass, 2 o'clock, lesion quadrant: UPPER OUTER QUADRANT. Using sterile technique with chlorhexidine as skin antisepsis, 1% lidocaine  and 1% lidocaine  with epinephrine  as local anesthetic, under direct ultrasound visualization, a 12 gauge Bard Marquee core needle device placed through an 11 gauge introducer needle was used to perform biopsy of the mass at 2 o'clock using a lateral approach. At the conclusion of the procedure, a ribbon shaped tissue marker clip was deployed into the biopsy cavity. # 2) Mass, 4 o'clock, lesion quadrant: LOWER OUTER QUADRANT. Using sterile technique with chlorhexidine as skin antisepsis, 1% lidocaine  and 1% lidocaine  with epinephrine  as local anesthetic, under direct ultrasound visualization, a 14 gauge Bard Marquee core needle device placed through a 13 gauge introducer needle was used perform biopsy of the mass at 4 o'clock using a lateral approach. At the conclusion of the procedure, a coil shaped tissue marker clip was deployed into the biopsy cavity. # 3) Mass, 12 o'clock location: Using sterile technique with chlorhexidine as skin antisepsis, 1% lidocaine  and 1% lidocaine  with epinephrine  as local anesthetic, under direct ultrasound visualization, a 14 gauge Bard Marquee core needle device placed through a 13 gauge introducer needle was used perform biopsy of the mass at 12 o'clock using a lateral approach. At the conclusion of the procedure, a heart shaped tissue marker clip was deployed into the biopsy cavity. # 4) Axillary lymph node: Using sterile technique with chlorhexidine as skin antisepsis, 1% lidocaine  and 1% lidocaine  with epinephrine  as  local anesthetic, under direct ultrasound visualization, a 14 gauge Bard Marquee core needle device placed through a 13 gauge introducer needle was used perform biopsy of the abnormal lymph node using an inferolateral approach. At the conclusion of the procedure, a Venus shaped tissue marker clip was deployed into the  biopsy cavity. The patient tolerated the procedures well without apparent immediate complications. Follow up 2 view mammogram was performed in order to confirm clip placement and was dictated separately. IMPRESSION: 1. Ultrasound-guided core needle biopsy of a dominant mass in the UPPER OUTER QUADRANT of the LEFT breast at 2 o'clock. 2. Ultrasound-guided core needle biopsy of a satellite mass in the LOWER OUTER QUADRANT of the LEFT breast at 4 o'clock. 3. Ultrasound-guided core needle biopsy of a satellite mass in the upper retroareolar LEFT breast at 12 o'clock. 4. Ultrasound-guided core needle biopsy of an abnormal LEFT axillary lymph node. Electronically Signed: By: Debby Satterfield M.D. On: 03/19/2024 17:17   US  LT BREAST BX W LOC DEV EA ADD LESION IMG BX SPEC US  GUIDE Addendum Date: 03/20/2024 ADDENDUM REPORT: 03/20/2024 11:21 ADDENDUM: Pathology revealed: Site 1. 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 LYMPHOVASCULAR INVASION: NOT IDENTIFIED. CANCER LENGTH: 11 MM / 1.1 CM. CALCIFICATIONS: NOT IDENTIFIED. Site 2. Breast, left 4 o'clock (coil clip) INTRAMAMMARY LYMPH NODE, INVOLVED BY INVASIVE DUCTAL CARCINOMA (1/1). CANCER LENGTH: 6 MM / 0.6 CM. Site 3. 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 NEGATIVE FOR ATYPIA OR MALIGNANCY. Site 4. 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 METASTATIC FOCUS: 5 MM /0.5 CM. All sites were found to be concordant by Dr. Madeleine Satterfield. RECOMMENDATION: 1. Breast MRI to evaluate extent of disease. 2. Surgical and oncological consultation. The patient was referred to [facility] at [facility] with appointment on Dec. 10, 2025. Pathology results were discussed with the patient by telephone. The patient reported doing well after the biopsy with tenderness at the site. Post biopsy instructions and care were reviewed  and questions were answered. The patient was encouraged to call [facility] for any additional concerns. Addendum dictated by Conard Billing R.T. (R)(M) Electronically Signed   By: Debby Satterfield M.D.   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 Dominant 3.5 cm mass with calcifications at 2 o'clock. Satellite 1.3 cm mass at 4 o'clock. Adjacent satellite masses at 12 o'clock, the largest measuring 1.1 cm. At least 2 abnormal LEFT axillary lymph nodes. EXAM: ULTRASOUND GUIDED LEFT BREAST CORE NEEDLE BIOPSY x 3 ULTRASOUND GUIDED LEFT AXILLARY LYMPH NODE CORE NEEDLE BIOPSY COMPARISON:  Previous exam(s). PROCEDURE: I met with the patient and we discussed the procedure of ultrasound-guided biopsy, including benefits and alternatives. We discussed the high likelihood of a successful procedure. We discussed the risks of the procedure, including infection,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 traumatic subluxation or static signs of instability. 2. Chest findings dictated separately. Electronically Signed   By: Elsie Perone M.D.   On: 03/17/2024 14:33  IMPRESSION: ***  {DIAGNOSIS HERE}  Patient will be a good candidate for breast conservation with radiotherapy to the left breast. We discussed the general course of radiation, potential side effects, and toxicities with radiation and the patient is interested in this approach. ***   PLAN:  ***   ------------------------------------------------  Lynwood CHARM Nasuti, PhD, MD  This document serves as a record of services personally performed by Lynwood Nasuti, MD. It was created on his behalf by Dorthy Fuse, a trained medical scribe. The creation of this record is based on the scribe's personal observations and the provider's statements to them. This document has been checked and approved by the attending provider.

## 2024-03-26 ENCOUNTER — Ambulatory Visit: Payer: Self-pay | Admitting: General Surgery

## 2024-03-26 ENCOUNTER — Encounter

## 2024-03-26 ENCOUNTER — Ambulatory Visit: Admitting: Physical Therapy

## 2024-03-26 ENCOUNTER — Ambulatory Visit
Admission: RE | Admit: 2024-03-26 | Discharge: 2024-03-26 | Attending: Radiation Oncology | Admitting: Radiation Oncology

## 2024-03-26 ENCOUNTER — Encounter: Payer: Self-pay | Admitting: Physical Therapy

## 2024-03-26 ENCOUNTER — Inpatient Hospital Stay: Attending: Family

## 2024-03-26 ENCOUNTER — Encounter: Payer: Self-pay | Admitting: *Deleted

## 2024-03-26 ENCOUNTER — Other Ambulatory Visit

## 2024-03-26 ENCOUNTER — Encounter: Payer: Self-pay | Admitting: Hematology

## 2024-03-26 ENCOUNTER — Encounter: Payer: Self-pay | Admitting: General Practice

## 2024-03-26 ENCOUNTER — Other Ambulatory Visit: Payer: Self-pay

## 2024-03-26 ENCOUNTER — Inpatient Hospital Stay: Attending: Family | Admitting: Hematology

## 2024-03-26 ENCOUNTER — Inpatient Hospital Stay: Admitting: Genetic Counselor

## 2024-03-26 VITALS — BP 135/87 | HR 72 | Temp 98.1°F | Ht 71.0 in | Wt 184.5 lb

## 2024-03-26 DIAGNOSIS — C50812 Malignant neoplasm of overlapping sites of left female breast: Secondary | ICD-10-CM | POA: Insufficient documentation

## 2024-03-26 DIAGNOSIS — Z5111 Encounter for antineoplastic chemotherapy: Secondary | ICD-10-CM | POA: Diagnosis present

## 2024-03-26 DIAGNOSIS — C50412 Malignant neoplasm of upper-outer quadrant of left female breast: Secondary | ICD-10-CM | POA: Insufficient documentation

## 2024-03-26 DIAGNOSIS — R293 Abnormal posture: Secondary | ICD-10-CM

## 2024-03-26 DIAGNOSIS — M25612 Stiffness of left shoulder, not elsewhere classified: Secondary | ICD-10-CM | POA: Insufficient documentation

## 2024-03-26 DIAGNOSIS — M25512 Pain in left shoulder: Secondary | ICD-10-CM

## 2024-03-26 DIAGNOSIS — Z170 Estrogen receptor positive status [ER+]: Secondary | ICD-10-CM

## 2024-03-26 DIAGNOSIS — Z1721 Progesterone receptor positive status: Secondary | ICD-10-CM | POA: Diagnosis not present

## 2024-03-26 DIAGNOSIS — Z79899 Other long term (current) drug therapy: Secondary | ICD-10-CM | POA: Diagnosis not present

## 2024-03-26 DIAGNOSIS — R590 Localized enlarged lymph nodes: Secondary | ICD-10-CM | POA: Diagnosis not present

## 2024-03-26 LAB — CBC WITH DIFFERENTIAL (CANCER CENTER ONLY)
Abs Immature Granulocytes: 0 K/uL (ref 0.00–0.07)
Basophils Absolute: 0 K/uL (ref 0.0–0.1)
Basophils Relative: 1 %
Eosinophils Absolute: 0.1 K/uL (ref 0.0–0.5)
Eosinophils Relative: 1 %
HCT: 40.7 % (ref 36.0–46.0)
Hemoglobin: 13.7 g/dL (ref 12.0–15.0)
Immature Granulocytes: 0 %
Lymphocytes Relative: 45 %
Lymphs Abs: 1.9 K/uL (ref 0.7–4.0)
MCH: 28 pg (ref 26.0–34.0)
MCHC: 33.7 g/dL (ref 30.0–36.0)
MCV: 83.2 fL (ref 80.0–100.0)
Monocytes Absolute: 0.3 K/uL (ref 0.1–1.0)
Monocytes Relative: 7 %
Neutro Abs: 1.9 K/uL (ref 1.7–7.7)
Neutrophils Relative %: 46 %
Platelet Count: 232 K/uL (ref 150–400)
RBC: 4.89 MIL/uL (ref 3.87–5.11)
RDW: 15 % (ref 11.5–15.5)
WBC Count: 4.2 K/uL (ref 4.0–10.5)
nRBC: 0 % (ref 0.0–0.2)

## 2024-03-26 LAB — GENETIC SCREENING ORDER

## 2024-03-26 LAB — CMP (CANCER CENTER ONLY)
ALT: 16 U/L (ref 0–44)
AST: 20 U/L (ref 15–41)
Albumin: 4.5 g/dL (ref 3.5–5.0)
Alkaline Phosphatase: 64 U/L (ref 38–126)
Anion gap: 6 (ref 5–15)
BUN: 9 mg/dL (ref 6–20)
CO2: 30 mmol/L (ref 22–32)
Calcium: 9.6 mg/dL (ref 8.9–10.3)
Chloride: 106 mmol/L (ref 98–111)
Creatinine: 0.85 mg/dL (ref 0.44–1.00)
GFR, Estimated: >60 mL/min (ref >60)
Glucose, Bld: 93 mg/dL (ref 70–99)
Potassium: 4.2 mmol/L (ref 3.5–5.1)
Sodium: 141 mmol/L (ref 135–145)
Total Bilirubin: 0.8 mg/dL (ref 0.0–1.2)
Total Protein: 7.2 g/dL (ref 6.5–8.1)

## 2024-03-26 MED ORDER — DEXAMETHASONE 4 MG PO TABS: ORAL_TABLET | ORAL | 1 refills | Status: DC

## 2024-03-26 MED ORDER — ONDANSETRON HCL 8 MG PO TABS: ORAL_TABLET | ORAL | 1 refills | Status: DC

## 2024-03-26 MED ORDER — LIDOCAINE-PRILOCAINE 2.5-2.5 % EX CREA: TOPICAL_CREAM | CUTANEOUS | 3 refills | Status: DC

## 2024-03-26 MED ORDER — PROCHLORPERAZINE MALEATE 10 MG PO TABS: 10.0000 mg | ORAL_TABLET | Freq: Four times a day (QID) | ORAL | 1 refills | Status: DC | PRN
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
## 2024-03-26 NOTE — Progress Notes (Signed)
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 HER2 negative.  -She is premenopausal by age but amenorrheic for two years due to endometrial ablation and IUD.  I will message her gynecologist about her  IUD removal it if it contains estrogen or progesterone.  -Moderate risk for recurrence due to nodal involvement and tumor size. Neoadjuvant chemotherapy is indicated due to her young age to reduce tumor burden, address micrometastatic disease, and improve surgical outcomes. Strong hormone receptor positivity may reduce chemotherapy sensitivity, but maximal response prior to surgery is the goal.  -Due to  the multiple lymph node positivity, I recommend chemotherapy AC every 2 weeks for 4 cycles, followed by weekly paclitaxel for 12 weeks --Chemotherapy consent: Side effects including but does not not limited to, fatigue, nausea, vomiting, diarrhea, hair loss, neuropathy, fluid retention, renal and kidney dysfunction, neutropenic fever, needed for blood transfusion, bleeding, were discussed with patient in great detail. She agrees to proceed. -Goal of chemotherapy is curative - Ordered breast MRI to further evaluate disease extent and clarify breast/lymph node involvement. - Ordered CT scan and bone scan for staging to exclude distant metastases. - Ordered echocardiogram to assess cardiac function prior to anthracycline-based chemotherapy. - Arranged port placement for chemotherapy administration. - Scheduled chemotherapy education for her and support person. - Prescribed two antiemetics and lidocaine  cream for port access discomfort. - Advised strict infection precautions during chemotherapy: avoid crowds, wear a mask in public, avoid contact with ill individuals. - Provided dietary counseling: recommend well-cooked, easy-to-digest foods; avoid raw, deep-fried, or spicy foods during chemotherapy. -  Discussed anticipated alopecia; she plans to cut hair prior to treatment. - Plan to assess tumor response clinically after each cycle chemotherapy; if no response or progression, will order ultrasound and consider early surgery. - Referred to social work for [employer] paperwork and work accommodations. - Plan to  initiate chemotherapy as soon as staging and pre-treatment workup are complete, aiming for within 2-3 weeks. - Patient was seen by surgeon Dr. Curvin, and radiation oncologist Dr. Shannon today.  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 She is followed by hematology for anticoagulation management. Significant bleeding history, including menorrhagia requiring endometrial ablation and IUD, likely exacerbated by anticoagulation. Coordination with hematology is necessary during cancer treatment, especially around periods of increased bleeding risk (chemotherapy, surgery). - Continued Eliquis  5 mg BID for chronic anticoagulation. - Coordinated with hematology Teddi Pepper, NP) regarding ongoing anticoagulation management during cancer treatment; she prefers to continue hematology follow-up at her current clinic. - Contacted gynecologist regarding IUD and bleeding history for care coordination.  Substance abuse (marijuana, alcohol and smoking) -Advised cessation of alcohol after upcoming birthday due to increased risk of hepatic toxicity with chemotherapy. - Advised smoking cessation; discussed future need for lung cancer screening post-breast cancer treatment. - I also recommended her to quit marijuana after she completes chemotherapy   Plan - Lab work, imaging and biopsy results reviewed with patient and her significant other - We recommended neoadjuvant chemotherapy - Staging CT and bone scan, echocardiogram, and breast MRI - Port placement by Dr. Curvin - Plan to start neoadjuvant chemotherapy dose dense AC with GCSF in 2 to 3 weeks   Orders Placed This Encounter  Procedures   FSH-Follicle stimulating hormone    Standing Status:   Future    Expected Date:   04/09/2024    Expiration Date:   03/26/2025   Estriol    Standing Status:    Future    Expected Date:   04/09/2024    Expiration Date:   03/26/2025    All questions were answered. The patient knows to call the clinic with any problems, questions or concerns. I spent 50 minutes counseling the patient face to face. The total time spent in the appointment was 60 minutes including review of chart and various tests results, discussions about plan of care and coordination of care plan.     Onita Mattock, MD 03/26/2024

## 2024-03-26 NOTE — Progress Notes (Signed)
 [facility] Multidisciplinary Clinic Spiritual Care Note  Met with Monica Holland, her wife, and her sister, in Breast Multidisciplinary Clinic to introduce Support Center team/resources.  She completed SDOH screening; results follow below.    SDOH Screenings   Food Insecurity: Food Insecurity Present (03/26/2024)  Housing: High Risk (03/26/2024)  Transportation Needs: No Transportation Needs (03/26/2024)  Utilities: Not At Risk (03/26/2024)  Depression (PHQ2-9): Medium Risk (03/26/2024)  Tobacco Use: High Risk (03/26/2024)   Chaplain and patient discussed common feelings and emotions when being diagnosed with cancer, and the importance of support during treatment.  Chaplain informed patient of the support team and support services at [facility].  Chaplain provided contact information and encouraged patient to call with any questions or concerns.  [name] has experienced deaths of several close family members in recent years. She reports high distress about her diagnosis and treatment, requesting counseling.   Follow up needed: Yes.  Will consult with Social Work re counseling and financial needs. Set Designer Guide peer mentor referral per patient request. Emailed Shaynna full packet of [facility] and [employer] support programming information for December. Plan to follow up with [name] by phone next week, and she has direct Spiritual Care number in case needs or questions arise in the meantime.   [address] [place], [place], [facility] Pager [phone] Voicemail [phone]

## 2024-03-26 NOTE — Research (Signed)
 D7794, ICE COMPRESS: RANDOMIZED TRIAL OF LIMB CRYOCOMPRESSION VERSUS CONTINUOUS COMPRESSION VERSUS LOW CYCLIC COMPRESSION FOR THE PREVENTION  OF TAXANE-INDUCED PERIPHERAL NEUROPATHY    Patient Monica Holland was identified by Dr.Feng as a potential candidate for the above listed study.  This Clinical Research Nurse met with Monica Holland, [id number], on 03/26/24 in a manner and location that ensures patient privacy to discuss participation in the above listed research study.  Patient is Accompanied by wife and sister.  A copy of the informed consent document and separate HIPAA Authorization was provided to the patient.  Patient reads, speaks, and understands English.   Patient was provided with the business card of this Nurse and encouraged to contact the research team with any questions.  Approximately 10 minutes were spent with the patient reviewing the informed consent documents.  Patient was provided the option of taking informed consent documents home to review and was encouraged to review at their convenience with their support network, including other care providers. Patient took the consent documents home to review.  A copy of What to Expect brochure was given and reviewed with pt. Pt was informed that her participation is optional. They had no questions for the research nurse about the above study. The pt asked that the research nurse call her next week any day and time to discuss the study. She stated she is interested in helping out. Pt was thanked for her time and consideration.   Monica Larsen, RN, BSN Clinical Research Nurse [phone] 03/26/2024

## 2024-03-26 NOTE — Research (Signed)
 [employer] 2021-05 - Specimen Collection Study to Evaluate Biomarkers in Subjects with Cancer    Patient Monica Holland was identified by Dr.Feng as a potential candidate for the above listed study.  This Clinical Research Nurse met with ASANTE RITACCO, [id number], on 03/26/24 in a manner and location that ensures patient privacy to discuss participation in the above listed research study.  Patient is Accompanied by wife and sister.  A copy of the informed consent document with embedded HIPAA language was provided to the patient.  Patient reads, speaks, and understands English.   Patient was provided with the business card of this Nurse and encouraged to contact the research team with any questions.  Approximately 10 minutes were spent with the patient reviewing the informed consent documents.  Patient was provided the option of taking informed consent documents home to review and was encouraged to review at their convenience with their support network, including other care providers. Patient took the consent documents home to review.  The pt was informed that she will need to consent and have her blood drawn before her surgery or before any treatment. The pt was told that her participation is optional for this study and that she would get a $50 gift card if she participates in the study with a successful blood sample. Pt's wife and sister is also interested in participating in the study as well. Pt would like time to read over the consent. She stated we can follow up with her next week on any day and time. Pt was thanked for her time and consideration.   [name] Larsen, RN, BSN Clinical Research Nurse [phone] 03/26/2024

## 2024-03-26 NOTE — Progress Notes (Signed)
 START ON PATHWAY REGIMEN - Breast     Cycles 1 through 4: A cycle is every 14 days:     Doxorubicin      Cyclophosphamide      Pegfilgrastim-xxxx    Cycles 5 through 16: A cycle is every 7 days:     Paclitaxel   **Always confirm dose/schedule in your pharmacy ordering system**  Patient Characteristics: Preoperative or Nonsurgical Candidate, M0 (Clinical Staging), Up to cT4c, Any N, M0, Neoadjuvant Therapy followed by Surgery, Invasive Disease, Chemotherapy, HER2 Negative, ER Positive Therapeutic Status: Preoperative or Nonsurgical Candidate, M0 (Clinical Staging) AJCC T Category: cT2 AJCC N Category: cN1 AJCC M Category: cM0 AJCC Grade: G2 ER Status: Positive (+) PR Status: Positive (+) HER2 Status: Negative (-) AJCC 8 Stage Grouping: IIA Breast Surgical Plan: Neoadjuvant Therapy followed by Surgery Intent of Therapy: Curative Intent, Discussed with Patient

## 2024-03-27 ENCOUNTER — Telehealth: Payer: Self-pay | Admitting: Obstetrics and Gynecology

## 2024-03-27 ENCOUNTER — Encounter: Payer: Self-pay | Admitting: Genetic Counselor

## 2024-03-27 ENCOUNTER — Encounter: Payer: Self-pay | Admitting: Obstetrics and Gynecology

## 2024-03-27 ENCOUNTER — Encounter: Payer: Self-pay | Admitting: Hematology

## 2024-03-27 DIAGNOSIS — Z803 Family history of malignant neoplasm of breast: Secondary | ICD-10-CM | POA: Insufficient documentation

## 2024-03-27 NOTE — Patient Instructions (Signed)
 SURGICAL WAITING ROOM VISITATION Patients having surgery or a procedure may have no more than 2 support people in the waiting area - these visitors may rotate in the visitor waiting room.   Due to an increase in RSV and influenza rates and associated hospitalizations, children ages 63 and under may not visit patients in [facility] hospitals. If the patient needs to stay at the hospital during part of their recovery, the visitor guidelines for inpatient rooms apply.  PRE-OP VISITATION  Pre-op nurse will coordinate an appropriate time for 1 support person to accompany the patient in pre-op.  This support person may not rotate.  This visitor will be contacted when the time is appropriate for the visitor to come back in the pre-op area.  Please refer to the [facility] website for the visitor guidelines for Inpatients (after your surgery is over and you are in a regular room).  You are not required to quarantine at this time prior to your surgery. However, you must do this: Hand Hygiene often Do NOT share personal items Notify your provider if you are in close contact with someone who has COVID or you develop fever 100.4 or greater, new onset of sneezing, cough, sore throat, shortness of breath or body aches.  If you test positive for Covid or have been in contact with anyone that has tested positive in the last 10 days please notify you surgeon.    Your procedure is scheduled on:  03/31/24  Report to [facility] Main Entrance: [address] entrance where the [employer] is available.   Report to admitting at:  5:15 AM  Call this number if you have any questions or problems the morning of surgery [phone]  FOLLOW ANY ADDITIONAL PRE OP INSTRUCTIONS YOU RECEIVED FROM YOUR SURGEON'S OFFICE!!!  Do not eat food or drink fluids after Midnight the night prior to your surgery/procedure.   Oral Hygiene is also important to reduce your risk of infection.        Remember - BRUSH YOUR TEETH  THE MORNING OF SURGERY WITH YOUR REGULAR TOOTHPASTE  Do NOT smoke after Midnight the night before surgery.  STOP TAKING all Vitamins, Herbs and supplements 1 week before your surgery.   Take ONLY these medicines the morning of surgery with A SIP OF WATER: cetirizine ,pantoprazole .Use inhalers as usual and bring them.                   You may not have any metal on your body including hair pins, jewelry, and body piercing  Do not wear make-up, lotions, powders, perfumes / cologne, or deodorant  Do not wear nail polish including gel and S&S, artificial / acrylic nails, or any other type of covering on natural nails including finger and toenails. If you have artificial nails, gel coating, etc., that needs to be removed by a nail salon, Please have this removed prior to surgery. Not doing so may mean that your surgery could be cancelled or delayed if the Surgeon or anesthesia staff feels like they are unable to monitor you safely.   Do not shave 48 hours prior to surgery to avoid nicks in your skin which may contribute to postoperative infections.   Contacts, Hearing Aids, dentures or bridgework may not be worn into surgery. DENTURES WILL BE REMOVED PRIOR TO SURGERY PLEASE DO NOT APPLY Poly grip OR ADHESIVES!!!  You may bring a small overnight bag with you on the day of surgery, only pack items that are not valuable.  [facility] IS NOT RESPONSIBLE   FOR VALUABLES THAT ARE LOST OR STOLEN.   Patients discharged on the day of surgery will not be allowed to drive home.  Someone NEEDS to stay with you for the first 24 hours after anesthesia.  Do not bring your home medications to the hospital. The Pharmacy will dispense medications listed on your medication list to you during your admission in the Hospital.  Special Instructions: Bring a copy of your healthcare power of attorney and living will documents the day of surgery, if you wish to have them scanned into your [facility] Records-  EPIC  Please read over the following fact sheets you were given: IF YOU HAVE QUESTIONS ABOUT YOUR PRE-OP INSTRUCTIONS, PLEASE CALL [phone]   [facility] - Preparing for Surgery      Before surgery, you can play an important role.  Because skin is not sterile, your skin needs to be as free of germs as possible.  You can reduce the number of germs on your skin by washing with CHG (chlorahexidine gluconate) soap before surgery.  CHG is an antiseptic cleaner which kills germs and bonds with the skin to continue killing germs even after washing. Please DO NOT use if you have an allergy to CHG or antibacterial soaps.  If your skin becomes reddened/irritated stop using the CHG and inform your nurse when you arrive at Short Stay. Do not shave (including legs and underarms) for at least 48 hours prior to the first CHG shower.  You may shave your face/neck.  Please follow these instructions carefully:  1.  Shower with CHG Soap the night before surgery ONLY (DO NOT USE THE SOAP THE MORNING OF SURGERY).  2.  If you choose to wash your hair, wash your hair first as usual with your normal  shampoo.  3.  After you shampoo, rinse your hair and body thoroughly to remove the shampoo.                             4.  Use CHG as you would any other liquid soap.  You can apply chg directly to the skin and wash.  Gently with a scrungie or clean washcloth.  5.  Apply the CHG Soap to your body ONLY FROM THE NECK DOWN.   Do not use on face/ open                           Wound or open sores. Avoid contact with eyes, ears mouth and genitals (private parts).                       Wash face,  Genitals (private parts) with your normal soap.             6.  Wash thoroughly, paying special attention to the area where your  surgery  will be performed.  7.  Thoroughly rinse your body with warm water from the neck down.  8.  DO NOT shower/wash with your normal soap after using and rinsing off the CHG Soap.                9.   Pat yourself dry with a clean towel.            10.  Wear clean pajamas.            11.  Place clean  sheets on your bed the night of your first shower and do not  sleep with pets.  Day of Surgery : Do not apply any CHG, lotions/deodorants the morning of surgery.  Please wear clean clothes to the hospital/surgery center.   FAILURE TO FOLLOW THESE INSTRUCTIONS MAY RESULT IN THE CANCELLATION OF YOUR SURGERY  PATIENT SIGNATURE_________________________________  NURSE SIGNATURE__________________________________  ________________________________________________________________________

## 2024-03-27 NOTE — Telephone Encounter (Signed)
 Attempted to reach patient and get her scheduled for an appointment with Dr. Cleatus. Was not able to leave a message, so I sent her a MyChart message.

## 2024-03-27 NOTE — Progress Notes (Signed)
 REFERRING PROVIDER: [name], MD [address] [place],  [place] [address]  PRIMARY PROVIDER:  Emerick Edelman, MD  PRIMARY REASON FOR VISIT:  1. Family history of breast cancer   2. Malignant neoplasm of upper-outer quadrant of left breast in female, estrogen receptor positive (HCC)      HISTORY OF PRESENT ILLNESS:   Ms. Marmol, a 38 y.o. female, was seen for a [place] cancer genetics consultation at the request of Dr. Lanny due to a personal and family history of breast cancer.  Ms. Sanjose presents to clinic today to discuss the possibility of a hereditary predisposition to cancer, genetic testing, and to further clarify her future cancer risks, as well as potential cancer risks for family members.   In December 2025, at the age of 37, Ms. Hapke was diagnosed with invasive ductal carcinoma of the left breast. The treatment plan includes chemotherapy, surgery and radiation.   CANCER HISTORY:  Oncology History  Malignant neoplasm of upper-outer quadrant of left breast in female, estrogen receptor positive (HCC)  03/19/2024 Cancer Staging   Staging form: Breast, AJCC 8th Edition - Clinical stage from 03/19/2024: Stage IIA (cT2, cN1, cM0, G2, ER+, PR+, HER2-) - Signed by [name], MD on 03/26/2024 Stage prefix: Initial diagnosis Histologic grading system: 3 grade system   03/24/2024 Initial Diagnosis   Malignant neoplasm of upper-outer quadrant of left breast in female, estrogen receptor positive (HCC)   04/08/2024 -  Chemotherapy   Patient is on Treatment Plan : BREAST DOSE DENSE AC q14d / PACLitaxel q7d       Past Medical History:  Diagnosis Date   Abnormal uterine bleeding (AUB)    heavy vaginal bleeding   Anticoagulant long-term use    eliquis --- managed by dr timmy (hematologist)   Chronic nausea    per pt intermittant , without vomiting,  had normal EGD 12-29-2021 by dr federico   Chronic venous insufficiency of lower extremity    evaluted by dr debby robertson  (vascular) 03-15-2021 note in epic, chronic venous insuff LLE causing edema/ post thombotic syndrome   Family history of breast cancer    GERD (gastroesophageal reflux disease)    History of pulmonary embolus (PE) 2014   nonocclusive and RLE DVT   History of recurrent deep vein thrombosis (DVT)    LLE in 2004, 2009, 2016, 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   Wears glasses     Past Surgical History:  Procedure Laterality Date   ANKLE SURGERY Right 2014   BREAST BIOPSY Left 03/19/2024   US  LT BREAST BX W LOC DEV EA ADD LESION IMG BX SPEC US  GUIDE 03/19/2024 [facility]   BREAST BIOPSY Left 03/19/2024   US  LT BREAST BX W LOC DEV 1ST LESION IMG BX SPEC US  GUIDE 03/19/2024 [facility]   BREAST BIOPSY Left 03/19/2024   US  LT BREAST BX W LOC DEV EA ADD LESION IMG BX SPEC US  GUIDE 03/19/2024 [facility]   DILITATION & CURRETTAGE/HYSTROSCOPY WITH NOVASURE ABLATION N/A 01/04/2022   Procedure: DILATATION & CURETTAGE/HYSTEROSCOPY WITH NOVASURE ABLATION;  Surgeon: Cleatus Moccasin, MD;  Location: [facility];  Service: Gynecology;  Laterality: N/A;   ESOPHAGOGASTRODUODENOSCOPY (EGD) WITH PROPOFOL   12/29/2021   by dr federico   INTRAUTERINE DEVICE (IUD) INSERTION N/A 01/04/2022  Procedure: INTRAUTERINE DEVICE (IUD) INSERTION;  Surgeon: Cleatus Moccasin, MD;  Location: [facility];  Service: Gynecology;  Laterality: N/A;    Social History   Socioeconomic History   Marital status: Significant Other    Spouse name: Not on file   Number of children: 4   Years of education: Not on file   Highest education level: Not on file  Occupational History   Occupation: international aid/development worker  Tobacco Use   Smoking status: Every Day    Current packs/day: 1.50    Average packs/day: 1.5 packs/day for 31.9 years  (47.9 ttl pk-yrs)    Types: Cigarettes    Start date: 1994   Smokeless tobacco: Never   Tobacco comments:    12-30-2021  per pt average 1-2 cig per day down from 1/ 2ppd  Vaping Use   Vaping status: Never Used  Substance and Sexual Activity   Alcohol use: Yes    Alcohol/week: 3.0 standard drinks of alcohol    Types: 3 Cans of beer per week    Comment: used to drink heavy, cut down since 06/2023   Drug use: Yes    Types: Marijuana    Comment: 01/03/2022   Sexual activity: Yes    Birth control/protection: None  Other Topics Concern   Not on file  Social History Narrative   Not on file   Social Drivers of Health   Tobacco Use: High Risk (03/26/2024)   Patient History    Smoking Tobacco Use: Every Day    Smokeless Tobacco Use: Never    Passive Exposure: Not on file  Financial Resource Strain: Not on file  Food Insecurity: Food Insecurity Present (03/26/2024)   Epic    Worried About Programme Researcher, Broadcasting/film/video in the Last Year: Never true    [employer] of Food in the Last Year: Sometimes true  Transportation Needs: No Transportation Needs (03/26/2024)   Epic    Lack of Transportation (Medical): No    Lack of Transportation (Non-Medical): No  Physical Activity: Not on file  Stress: Not on file (02/22/2023)  Social Connections: Not on file  Depression (PHQ2-9): Medium Risk (03/26/2024)   Depression (PHQ2-9)    PHQ-2 Score: 5  Alcohol Screen: Not on file  Housing: High Risk (03/26/2024)   Epic    Unable to Pay for Housing in the Last Year: Yes    Number of Times Moved in the Last Year: 0    Homeless in the Last Year: No  Utilities: Not At Risk (03/26/2024)   Epic    Threatened with loss of utilities: No  Health Literacy: Not on file     FAMILY HISTORY:  We obtained a detailed, 4-generation family history.  Significant diagnoses are listed below: Family History  Problem Relation Age of Onset   Deep vein thrombosis Mother    Hyperlipidemia Mother    Diabetes Mother    High  Cholesterol Mother    Heart attack Father    Breast cancer Paternal Aunt    Colon cancer Paternal Grandmother    Pancreatitis Half-Brother        paternal half brother   Down syndrome Half-Sister        maternal half sister   Learning disabilities Half-Sister        maternal half sister   Stomach cancer Neg Hx    Esophageal cancer Neg Hx    Rectal cancer Neg Hx      Significant family history reported includes: Paternal aunt with  breast cancer Paternal half brother with pancreatitis Maternal half sister with learning disabilities/mental retardation Maternal half sister with Down Syndrome  Ms. Satterfield is unaware of previous family history of genetic testing for hereditary cancer risks.  There is no reported Ashkenazi Jewish ancestry. There is no known consanguinity.  GENETIC COUNSELING ASSESSMENT: Ms. Carneiro is a 38 y.o. female with a personal and family history of breast cancer which is somewhat suggestive of a hereditary cancer syndrome and predisposition to cancer given her young age of onset. We, therefore, discussed and recommended the following at today's visit.   DISCUSSION: We discussed that, in general, most cancer is not inherited in families, but instead is sporadic or familial. Sporadic cancers occur by chance and typically happen at older ages (>50 years) as this type of cancer is caused by genetic changes acquired during an individuals lifetime. Some families have more cancers than would be expected by chance; however, the ages or types of cancer are not consistent with a known genetic mutation or known genetic mutations have been ruled out. This type of familial cancer is thought to be due to a combination of multiple genetic, environmental, hormonal, and lifestyle factors. While this combination of factors likely increases the risk of cancer, the exact source of this risk is not currently identifiable or testable.  We discussed that 5 - 10% of breast cancer is hereditary,  with most cases associated with BRCA mutations.  There are other genes that can be associated with hereditary breast cancer syndromes.  These include ATM, CHEK2 and PALB2.  We discussed that testing is beneficial for several reasons including knowing how to follow individuals after completing their treatment, identifying whether potential treatment options such as PARP inhibitors would be beneficial, and understand if other family members could be at risk for cancer and allow them to undergo genetic testing.   We reviewed the characteristics, features and inheritance patterns of hereditary cancer syndromes. We also discussed genetic testing, including the appropriate family members to test, the process of testing, insurance coverage and turn-around-time for results. We discussed the implications of a negative, positive, carrier and/or variant of uncertain significant result. Ms. Riedlinger  was offered a common hereditary cancer panel (36+ genes) and an expanded pan-cancer panel (70+ genes). Ms. Glantz was informed of the benefits and limitations of each panel, including that expanded pan-cancer panels contain genes that do not have clear management guidelines at this point in time.  We also discussed that as the number of genes included on a panel increases, the chances of variants of uncertain significance increases. Ms. Trevizo decided to pursue genetic testing for the CancerNext-Expanded+RNAinsight gene panel.   The CancerNext-Expanded gene panel offered by [facility] and includes sequencing, rearrangement, and RNA analysis for the following 77 genes: AIP, ALK, APC, ATM, BAP1, BARD1, BMPR1A, BRCA1, BRCA2, BRIP1, CDC73, CDH1, CDK4, CDKN1B, CDKN2A, CEBPA, CHEK2, CTNNA1, DDX41, DICER1, ETV6, FH, FLCN, GATA2, LZTR1, MAX, MBD4, MEN1, MET, MLH1, MSH2, MSH3, MSH6, MUTYH, NF1, NF2, NTHL1, PALB2, PHOX2B, PMS2, POT1, PRKAR1A, PTCH1, PTEN, RAD51C, RAD51D, RB1, RET, RPS20, RUNX1, SDHA, SDHAF2, SDHB, SDHC, SDHD, SMAD4,  SMARCA4, SMARCB1, SMARCE1, STK11, SUFU, TMEM127, TP53, TSC1, TSC2, VHL, and WT1 (sequencing and deletion/duplication); AXIN2, CTNNA1, DDX41, EGFR, HOXB13, KIT, MBD4, MITF, MSH3, PDGFRA, POLD1 and POLE (sequencing only); EPCAM and GREM1 (deletion/duplication only). RNA data is routinely analyzed for use in variant interpretation for all genes.   Based on Ms. Waldschmidt's personal and family history of cancer, she meets medical criteria for genetic testing. Despite  that she meets criteria, she may still have an out of pocket cost. We discussed that if her out of pocket cost for testing is over $100, the laboratory will call and confirm whether she wants to proceed with testing.  If the out of pocket cost of testing is less than $100 she will be billed by the genetic testing laboratory. We encouraged her to apply for [employer]'s financial assistance program.  PLAN:  After considering the risks, benefits, and limitations, Ms. Somarriba provided informed consent to pursue genetic testing and the blood sample was sent to [facility] for analysis of the CancerNext-Expanded+RNAinsight. Results should be available within approximately 2-3 weeks' time, at which point they will be disclosed by telephone to Ms. Simko, as will any additional recommendations warranted by these results. Ms. Cryder will receive a summary of her genetic counseling visit and a copy of her results once available. This information will also be available in Epic.  Patient giving information on how to apply for [employer] program.  Lastly, we encouraged Ms. Vale to remain in contact with cancer genetics annually so that we can continuously update the family history and inform her of any changes in cancer genetics and testing that may be of benefit for this family.   Ms. Polo questions were answered to her satisfaction today. Our contact information was provided should additional questions or concerns arise. Thank  you for the referral and allowing us  to share in the care of your patient.   Emilyrose Darrah P. Perri, MS, CGC Licensed, Patent Attorney [email or web address] phone: [phone]  I personally spent a total of 35 minutes in the care of the patient today including preparing to see the patient, getting/reviewing separately obtained history, counseling and educating, placing orders, referring and communicating with other health care professionals, and documenting clinical information in the EHR.  The patient brought her friend and fiance. Drs. Lanny Stalls, and/or Gudena were available for questions, if needed..    _______________________________________________________________________ For Office Staff:  Number of people involved in session: 3 Was an Intern/ student involved with case: no

## 2024-03-28 ENCOUNTER — Other Ambulatory Visit: Payer: Self-pay | Admitting: Hematology

## 2024-03-28 ENCOUNTER — Encounter (HOSPITAL_COMMUNITY)
Admission: RE | Admit: 2024-03-28 | Discharge: 2024-03-28 | Disposition: A | Source: Ambulatory Visit | Attending: General Surgery

## 2024-03-28 ENCOUNTER — Other Ambulatory Visit: Payer: Self-pay

## 2024-03-28 ENCOUNTER — Telehealth: Payer: Self-pay | Admitting: Radiation Oncology

## 2024-03-28 ENCOUNTER — Encounter (HOSPITAL_COMMUNITY): Payer: Self-pay

## 2024-03-28 VITALS — BP 119/95 | HR 81 | Temp 98.5°F | Ht 71.0 in | Wt 178.0 lb

## 2024-03-28 DIAGNOSIS — Z86718 Personal history of other venous thrombosis and embolism: Secondary | ICD-10-CM

## 2024-03-28 HISTORY — DX: Unspecified osteoarthritis, unspecified site: M19.90

## 2024-03-28 HISTORY — DX: Anxiety disorder, unspecified: F41.9

## 2024-03-28 HISTORY — DX: Peripheral vascular disease, unspecified: I73.9

## 2024-03-28 HISTORY — DX: Depression, unspecified: F32.A

## 2024-03-28 HISTORY — DX: Malignant (primary) neoplasm, unspecified: C80.1
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
## 2024-03-28 NOTE — Telephone Encounter (Signed)
 12/12 Received FMLA paperwork for Monica Holland; paperwork place in orange folder and given to [name] team for completion.

## 2024-03-28 NOTE — Anesthesia Preprocedure Evaluation (Signed)
 Anesthesia Evaluation  Patient identified by MRN, date of birth, ID band Patient awake    Reviewed: Allergy & Precautions, NPO status , Patient's Chart, lab work & pertinent test results  Airway Mallampati: I  TM Distance: >3 FB Neck ROM: Full    Dental  (+) Dental Advisory Given, Chipped,    Pulmonary Current Smoker and Patient abstained from smoking., PE   Pulmonary exam normal breath sounds clear to auscultation       Cardiovascular + Peripheral Vascular Disease and + DVT  Normal cardiovascular exam Rhythm:Regular Rate:Normal  TTE 2023 1. Left ventricular ejection fraction, by estimation, is 60 to 65%. The  left ventricle has normal function. The left ventricle has no regional  wall motion abnormalities. Left ventricular diastolic parameters were  normal.   2. Right ventricular systolic function is normal. The right ventricular  size is normal. There is normal pulmonary artery systolic pressure. The  estimated right ventricular systolic pressure is 24.2 mmHg.   3. The mitral valve is grossly normal. Trivial mitral valve  regurgitation. No evidence of mitral stenosis.   4. The aortic valve is tricuspid. Aortic valve regurgitation is not  visualized. No aortic stenosis is present.   5. The inferior vena cava is dilated in size with >50% respiratory  variability, suggesting right atrial pressure of 8 mmHg.     Neuro/Psych  PSYCHIATRIC DISORDERS Anxiety Depression    negative neurological ROS     GI/Hepatic Neg liver ROS,GERD  ,,  Endo/Other  negative endocrine ROS    Renal/GU negative Renal ROS  negative genitourinary   Musculoskeletal  (+) Arthritis ,    Abdominal   Peds  Hematology  (+) Blood dyscrasia (eliquis )   Anesthesia Other Findings   Reproductive/Obstetrics                              Anesthesia Physical Anesthesia Plan  ASA: 2  Anesthesia Plan: General   Post-op  Pain Management: Tylenol  PO (pre-op)*   Induction: Intravenous  PONV Risk Score and Plan: 2 and Ondansetron , Dexamethasone  and Midazolam   Airway Management Planned: LMA  Additional Equipment:   Intra-op Plan:   Post-operative Plan: Extubation in OR  Informed Consent: I have reviewed the patients History and Physical, chart, labs and discussed the procedure including the risks, benefits and alternatives for the proposed anesthesia with the patient or authorized representative who has indicated his/her understanding and acceptance.     Dental advisory given  Plan Discussed with: CRNA  Anesthesia Plan Comments: (See PAT note from 12/12 )         Anesthesia Quick Evaluation

## 2024-03-31 ENCOUNTER — Ambulatory Visit: Payer: Self-pay | Admitting: Cardiovascular Disease

## 2024-03-31 ENCOUNTER — Ambulatory Visit (HOSPITAL_COMMUNITY)
Admission: RE | Admit: 2024-03-31 | Discharge: 2024-03-31 | Disposition: A | Attending: General Surgery | Admitting: General Surgery

## 2024-03-31 ENCOUNTER — Encounter: Payer: Self-pay | Admitting: Hematology

## 2024-03-31 ENCOUNTER — Encounter (HOSPITAL_COMMUNITY): Payer: Self-pay | Admitting: General Surgery

## 2024-03-31 ENCOUNTER — Ambulatory Visit (HOSPITAL_COMMUNITY): Admitting: Certified Registered Nurse Anesthetist

## 2024-03-31 ENCOUNTER — Encounter (HOSPITAL_COMMUNITY): Payer: Self-pay | Admitting: Medical

## 2024-03-31 ENCOUNTER — Ambulatory Visit (HOSPITAL_COMMUNITY)

## 2024-03-31 ENCOUNTER — Encounter (HOSPITAL_COMMUNITY): Admission: RE | Disposition: A | Payer: Self-pay | Source: Home / Self Care | Attending: General Surgery

## 2024-03-31 ENCOUNTER — Other Ambulatory Visit (HOSPITAL_COMMUNITY): Payer: Self-pay

## 2024-03-31 ENCOUNTER — Telehealth: Payer: Self-pay | Admitting: *Deleted

## 2024-03-31 DIAGNOSIS — I739 Peripheral vascular disease, unspecified: Secondary | ICD-10-CM | POA: Diagnosis not present

## 2024-03-31 DIAGNOSIS — K219 Gastro-esophageal reflux disease without esophagitis: Secondary | ICD-10-CM | POA: Insufficient documentation

## 2024-03-31 DIAGNOSIS — C50412 Malignant neoplasm of upper-outer quadrant of left female breast: Secondary | ICD-10-CM | POA: Diagnosis present

## 2024-03-31 DIAGNOSIS — Z86718 Personal history of other venous thrombosis and embolism: Secondary | ICD-10-CM | POA: Diagnosis not present

## 2024-03-31 DIAGNOSIS — Z1732 Human epidermal growth factor receptor 2 negative status: Secondary | ICD-10-CM | POA: Diagnosis not present

## 2024-03-31 DIAGNOSIS — F17210 Nicotine dependence, cigarettes, uncomplicated: Secondary | ICD-10-CM | POA: Diagnosis not present

## 2024-03-31 DIAGNOSIS — Z86711 Personal history of pulmonary embolism: Secondary | ICD-10-CM | POA: Insufficient documentation

## 2024-03-31 DIAGNOSIS — Z1721 Progesterone receptor positive status: Secondary | ICD-10-CM | POA: Insufficient documentation

## 2024-03-31 DIAGNOSIS — Z7901 Long term (current) use of anticoagulants: Secondary | ICD-10-CM | POA: Insufficient documentation

## 2024-03-31 DIAGNOSIS — Z170 Estrogen receptor positive status [ER+]: Secondary | ICD-10-CM | POA: Diagnosis not present

## 2024-03-31 DIAGNOSIS — C50912 Malignant neoplasm of unspecified site of left female breast: Secondary | ICD-10-CM | POA: Diagnosis not present

## 2024-03-31 HISTORY — PX: PORTACATH PLACEMENT: SHX2246

## 2024-03-31 SURGERY — INSERTION, TUNNELED CENTRAL VENOUS DEVICE, WITH PORT
Anesthesia: General

## 2024-03-31 MED ORDER — AMISULPRIDE (ANTIEMETIC) 5 MG/2ML IV SOLN
10.0000 mg | Freq: Once | INTRAVENOUS | Status: AC | PRN
  Administered 2024-03-31: 09:00:00 10 mg via INTRAVENOUS

## 2024-03-31 MED ORDER — PHENYLEPHRINE 80 MCG/ML (10ML) SYRINGE FOR IV PUSH (FOR BLOOD PRESSURE SUPPORT)
PREFILLED_SYRINGE | INTRAVENOUS | Status: AC
  Filled 2024-03-31: qty 10

## 2024-03-31 MED ORDER — ACETAMINOPHEN 500 MG PO TABS: 1000.0000 mg | ORAL_TABLET | Freq: Once | ORAL | Status: DC

## 2024-03-31 MED ORDER — MIDAZOLAM HCL (PF) 2 MG/2ML IJ SOLN
INTRAMUSCULAR | Status: DC | PRN
  Administered 2024-03-31: 08:00:00 2 mg via INTRAVENOUS

## 2024-03-31 MED ORDER — FENTANYL CITRATE (PF) 50 MCG/ML IJ SOSY
25.0000 ug | PREFILLED_SYRINGE | INTRAMUSCULAR | Status: DC | PRN
  Administered 2024-03-31 (×3): 50 ug via INTRAVENOUS

## 2024-03-31 MED ORDER — LIDOCAINE HCL (PF) 2 % IJ SOLN
INTRAMUSCULAR | Status: DC | PRN
  Administered 2024-03-31: 08:00:00 80 mg via INTRADERMAL

## 2024-03-31 MED ORDER — OXYCODONE HCL 5 MG PO TABS
5.0000 mg | ORAL_TABLET | Freq: Once | ORAL | Status: AC | PRN
  Administered 2024-03-31: 10:00:00 5 mg via ORAL

## 2024-03-31 MED ORDER — CHLORHEXIDINE GLUCONATE CLOTH 2 % EX PADS: 6.0000 | MEDICATED_PAD | Freq: Once | CUTANEOUS | Status: DC

## 2024-03-31 MED ORDER — STERILE WATER FOR IRRIGATION IR SOLN
Status: DC | PRN
  Administered 2024-03-31: 08:00:00 1000 mL

## 2024-03-31 MED ORDER — LIDOCAINE HCL (PF) 2 % IJ SOLN
INTRAMUSCULAR | Status: AC
  Filled 2024-03-31: qty 5

## 2024-03-31 MED ORDER — ONDANSETRON HCL 4 MG/2ML IJ SOLN
INTRAMUSCULAR | Status: DC | PRN
  Administered 2024-03-31: 08:00:00 4 mg via INTRAVENOUS

## 2024-03-31 MED ORDER — LACTATED RINGERS IV SOLN: INTRAVENOUS | Status: DC

## 2024-03-31 MED ORDER — DEXAMETHASONE SOD PHOSPHATE PF 10 MG/ML IJ SOLN
INTRAMUSCULAR | Status: DC | PRN
  Administered 2024-03-31: 08:00:00 10 mg via INTRAVENOUS

## 2024-03-31 MED ORDER — HEPARIN 6000 UNIT IRRIGATION SOLUTION
Freq: Once | Status: AC
  Administered 2024-03-31: 08:00:00 1
  Filled 2024-03-31: qty 6000

## 2024-03-31 MED ORDER — MIDAZOLAM HCL 2 MG/2ML IJ SOLN
INTRAMUSCULAR | Status: AC
  Filled 2024-03-31: qty 2

## 2024-03-31 MED ORDER — FENTANYL CITRATE (PF) 100 MCG/2ML IJ SOLN
INTRAMUSCULAR | Status: AC
  Filled 2024-03-31: qty 2

## 2024-03-31 MED ORDER — HEPARIN SOD (PORK) LOCK FLUSH 100 UNIT/ML IV SOLN
INTRAVENOUS | Status: DC | PRN
  Administered 2024-03-31: 08:00:00 400 [IU]

## 2024-03-31 MED ORDER — ORAL CARE MOUTH RINSE: 15.0000 mL | Freq: Once | OROMUCOSAL | Status: AC

## 2024-03-31 MED ORDER — FENTANYL CITRATE (PF) 50 MCG/ML IJ SOSY
PREFILLED_SYRINGE | INTRAMUSCULAR | Status: AC
  Filled 2024-03-31: qty 1

## 2024-03-31 MED ORDER — AMISULPRIDE (ANTIEMETIC) 5 MG/2ML IV SOLN
INTRAVENOUS | Status: AC
  Filled 2024-03-31: qty 4

## 2024-03-31 MED ORDER — OXYCODONE HCL 5 MG PO TABS
5.0000 mg | ORAL_TABLET | Freq: Four times a day (QID) | ORAL | 0 refills | Status: DC | PRN
  Filled 2024-03-31 – 2024-04-12 (×2): qty 5, 2d supply, fill #0

## 2024-03-31 MED ORDER — PROPOFOL 10 MG/ML IV BOLUS
INTRAVENOUS | Status: AC
  Filled 2024-03-31: qty 20

## 2024-03-31 MED ORDER — BUPIVACAINE HCL (PF) 0.25 % IJ SOLN
INTRAMUSCULAR | Status: DC | PRN
  Administered 2024-03-31: 08:00:00 4 mL

## 2024-03-31 MED ORDER — HEPARIN SOD (PORK) LOCK FLUSH 100 UNIT/ML IV SOLN
INTRAVENOUS | Status: AC
  Filled 2024-03-31: qty 5

## 2024-03-31 MED ORDER — CEFAZOLIN SODIUM-DEXTROSE 2-4 GM/100ML-% IV SOLN
2.0000 g | INTRAVENOUS | Status: AC
  Administered 2024-03-31: 08:00:00 2 g via INTRAVENOUS
  Filled 2024-03-31: qty 100

## 2024-03-31 MED ORDER — FENTANYL CITRATE (PF) 100 MCG/2ML IJ SOLN
INTRAMUSCULAR | Status: DC | PRN
  Administered 2024-03-31: 09:00:00 50 ug via INTRAVENOUS
  Administered 2024-03-31 (×6): 25 ug via INTRAVENOUS

## 2024-03-31 MED ORDER — CHLORHEXIDINE GLUCONATE 0.12 % MT SOLN
15.0000 mL | Freq: Once | OROMUCOSAL | Status: AC
  Administered 2024-03-31: 06:00:00 15 mL via OROMUCOSAL

## 2024-03-31 MED ORDER — OXYCODONE HCL 5 MG/5ML PO SOLN: 5.0000 mg | Freq: Once | ORAL | Status: AC | PRN

## 2024-03-31 MED ORDER — ONDANSETRON HCL 4 MG/2ML IJ SOLN
INTRAMUSCULAR | Status: AC
  Filled 2024-03-31: qty 2

## 2024-03-31 MED ORDER — ACETAMINOPHEN 10 MG/ML IV SOLN
INTRAVENOUS | Status: DC | PRN
  Administered 2024-03-31: 08:00:00 1000 mg via INTRAVENOUS

## 2024-03-31 MED ORDER — OXYCODONE HCL 5 MG PO TABS
ORAL_TABLET | ORAL | Status: AC
  Filled 2024-03-31: qty 1

## 2024-03-31 MED ORDER — PROPOFOL 10 MG/ML IV BOLUS
INTRAVENOUS | Status: DC | PRN
  Administered 2024-03-31: 08:00:00 200 mg via INTRAVENOUS
  Administered 2024-03-31: 08:00:00 20 mg via INTRAVENOUS
  Administered 2024-03-31: 08:00:00 10 mg via INTRAVENOUS

## 2024-03-31 MED ORDER — ACETAMINOPHEN 10 MG/ML IV SOLN
INTRAVENOUS | Status: AC
  Filled 2024-03-31: qty 100

## 2024-03-31 MED ORDER — BUPIVACAINE HCL (PF) 0.25 % IJ SOLN
INTRAMUSCULAR | Status: AC
  Filled 2024-03-31: qty 30

## 2024-03-31 MED ORDER — PHENYLEPHRINE HCL (PRESSORS) 10 MG/ML IV SOLN
INTRAVENOUS | Status: DC | PRN
  Administered 2024-03-31: 08:00:00 40 ug via INTRAVENOUS
  Administered 2024-03-31: 08:00:00 160 ug via INTRAVENOUS
  Administered 2024-03-31 (×2): 80 ug via INTRAVENOUS

## 2024-03-31 SURGICAL SUPPLY — 35 items
BAG COUNTER SPONGE SURGICOUNT (BAG) IMPLANT
BAG DECANTER FOR FLEXI CONT (MISCELLANEOUS) ×1 IMPLANT
BENZOIN TINCTURE PRP APPL 2/3 (GAUZE/BANDAGES/DRESSINGS) IMPLANT
BLADE HEX COATED 2.75 (ELECTRODE) ×1 IMPLANT
BLADE SURG 15 STRL LF DISP TIS (BLADE) ×1 IMPLANT
DERMABOND ADVANCED .7 DNX12 (GAUZE/BANDAGES/DRESSINGS) IMPLANT
DRAPE C-ARM 42X120 X-RAY (DRAPES) ×1 IMPLANT
DRAPE LAPAROSCOPIC ABDOMINAL (DRAPES) ×1 IMPLANT
DRSG TEGADERM 2-3/8X2-3/4 SM (GAUZE/BANDAGES/DRESSINGS) IMPLANT
DRSG TEGADERM 4X4.75 (GAUZE/BANDAGES/DRESSINGS) IMPLANT
ELECT REM PT RETURN 15FT ADLT (MISCELLANEOUS) ×1 IMPLANT
GAUZE 4X4 16PLY ~~LOC~~+RFID DBL (SPONGE) ×1 IMPLANT
GAUZE SPONGE 4X4 12PLY STRL (GAUZE/BANDAGES/DRESSINGS) IMPLANT
GLOVE BIO SURGEON STRL SZ7.5 (GLOVE) ×2 IMPLANT
GLOVE BIOGEL PI IND STRL 7.0 (GLOVE) ×1 IMPLANT
GOWN STRL REUS W/ TWL LRG LVL3 (GOWN DISPOSABLE) ×1 IMPLANT
GOWN STRL REUS W/ TWL XL LVL3 (GOWN DISPOSABLE) ×1 IMPLANT
KIT BASIN OR (CUSTOM PROCEDURE TRAY) ×1 IMPLANT
KIT PORT POWER 8FR ISP CVUE (Port) IMPLANT
KIT TURNOVER KIT A (KITS) ×1 IMPLANT
NDL HYPO 25X1 1.5 SAFETY (NEEDLE) ×1 IMPLANT
NEEDLE HYPO 25X1 1.5 SAFETY (NEEDLE) ×1 IMPLANT
NS IRRIG 1000ML POUR BTL (IV SOLUTION) ×1 IMPLANT
PACK BASIC VI WITH GOWN DISP (CUSTOM PROCEDURE TRAY) ×1 IMPLANT
PENCIL SMOKE EVACUATOR (MISCELLANEOUS) IMPLANT
SET PORT ACCESS POWERLOC (SET/KITS/TRAYS/PACK) IMPLANT
SPIKE FLUID TRANSFER (MISCELLANEOUS) ×1 IMPLANT
STRIP CLOSURE SKIN 1/2X4 (GAUZE/BANDAGES/DRESSINGS) IMPLANT
SUT MNCRL AB 4-0 PS2 18 (SUTURE) ×1 IMPLANT
SUT PROLENE 2 0 SH DA (SUTURE) ×1 IMPLANT
SUT SILK 2-0 30XBRD TIE 12 (SUTURE) IMPLANT
SUT VIC AB 3-0 SH 27XBRD (SUTURE) ×1 IMPLANT
SYR 10ML LL (SYRINGE) ×1 IMPLANT
SYR CONTROL 10ML LL (SYRINGE) ×1 IMPLANT
TOWEL OR DSP ST BLU DLX 10/PK (DISPOSABLE) ×1 IMPLANT

## 2024-03-31 NOTE — H&P (Signed)
 REFERRING PHYSICIAN: [name], MD PROVIDER: DEWARD GARNETTE NULL, MD MRN: [id number] DOB: [birth year] Subjective   Chief Complaint: Breast Cancer  History of Present Illness: Monica Holland is a 38 y.o. female who is seen today as an office consultation for evaluation of Breast Cancer  We are asked to see the patient in consultation by Dr. Lanny to evaluate her for a new left breast cancer. The patient is a 38 year old black female who recently felt a mass in the outer aspect of the left breast. She had a rotator cuff repair about a month ago. She was evaluated with mammogram and ultrasound and found to have 2 masses in the upper outer quadrant of the left breast measuring 3.5 cm and 1.3 cm. 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 She had 2 subareolar masses that were benign. She also had 2 lymph nodes that were abnormal in the left axilla 1 of which was biopsied and positive. She does have a history of DVTs and is on Eliquis . She has no family history of breast cancer. She does smoke.  Review of Systems: A complete review of systems was obtained from the patient. I have reviewed this information and discussed as appropriate with the patient. See HPI as well for other ROS.  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 (CMS/HHS-HCC)  DVT, lower extremity, proximal, acute, left (CMS/HHS-HCC)   Past Surgical History:  Procedure Laterality Date  dilitation and currettage N/A  01/04/2022  .Left Breast Biopsy Left 03/19/2024  cyst removal Right    Allergies  Allergen Reactions  Nsaids (Non-Steroidal Anti-Inflammatory Drug) Other (See Comments)  DVTs   Current Outpatient Medications on File Prior to Visit  Medication Sig Dispense Refill  pantoprazole  (PROTONIX ) 40 MG DR tablet Take 40 mg by mouth once daily  tiZANidine  (ZANAFLEX ) 2 MG tablet Take 2 mg by mouth every 8 (eight) hours as needed for Muscle spasms  apixaban  (ELIQUIS  ORAL) Take by mouth. Has not taken for months, has chronic dvt in left leg  warfarin (COUMADIN ) 7.5 MG tablet Take 7.5 mg by mouth once daily.   No current facility-administered medications on file prior to visit.   Family History  Problem Relation Age of Onset  Hyperlipidemia (Elevated cholesterol) Mother  Diabetes Mother  Deep vein thrombosis (DVT or abnormal blood clot formation) Mother    Social History   Tobacco Use  Smoking Status Every Day  Current packs/day: 0.50  Types: Cigarettes  Smokeless Tobacco Not on file    Social History   Socioeconomic History  Marital status: Single  Tobacco Use  Smoking status: Every Day  Current packs/day: 0.50  Types: Cigarettes  Substance and Sexual Activity  Alcohol use: No  Drug use: Yes  Types: Marijuana   Social Drivers of Health   Food Insecurity: No Food Insecurity (10/26/2021)  Received from [facility]  Hunger Vital Sign  Within the past 12 months, you worried that your food would run out before you got the money to buy more.: Never true  Within the past 12 months, the food you bought just didn't last and you didn't have money to get more.: Never true  Transportation Needs: No Transportation Needs (10/26/2021)  Received from [facility]  Lack of Transportation (Medical): No  Lack of Transportation (Non-Medical): No   Objective:  There were no vitals filed for this visit.  There is no height or weight on file to calculate  BMI.  Physical Exam Vitals reviewed.  Constitutional:  General: She is not in acute distress. Appearance: Normal appearance.  HENT:  Head: Normocephalic and atraumatic.  Right Ear: External ear normal.  Left Ear: External ear normal.  Nose: Nose normal.  Mouth/Throat:  Mouth: Mucous membranes are moist.  Pharynx: Oropharynx is clear.  Eyes:  General: No scleral icterus. Extraocular Movements: Extraocular movements intact.  Conjunctiva/sclera: Conjunctivae normal.  Pupils: Pupils are equal, round, and reactive to light.  Cardiovascular:  Rate and Rhythm: Normal rate and regular rhythm.  Pulses: Normal pulses.  Heart sounds: Normal heart sounds.  Pulmonary:  Effort: Pulmonary effort is normal. No respiratory distress.  Breath sounds: Normal breath sounds.  Abdominal:  General: Bowel sounds are normal.  Palpations: Abdomen is soft.  Tenderness: There is no abdominal tenderness.  Musculoskeletal:  General: No swelling, tenderness or deformity. Normal range of motion.  Cervical back: Normal range of motion and neck supple.  Skin: General: Skin is warm and dry.  Coloration: Skin is not jaundiced.  Neurological:  General: No focal deficit present.  Mental Status: She is alert and oriented to person, place, and time.  Psychiatric:  Mood and Affect: Mood normal.  Behavior: Behavior normal.     Breast: There is roughly a 5 cm of palpable mass in the outer aspect of the left breast. The lower half of the breast does have some peau d'orange changes and some slight redness. She does have a palpable lymph node in the left axilla  Labs, Imaging and Diagnostic Testing:  Assessment and Plan:   Diagnoses and all orders for this visit:  Malignant neoplasm of upper-outer quadrant of left breast in female, estrogen receptor positive (CMS/HHS-HCC)   The patient appears to have a 3.5 cm cancer in the outer aspect of the left breast with an adjacent positive intramammary lymph node  and 2 positive lymph nodes in the axilla. Given her young age and the lymph node involvement and size of the primary cancer I feel she would benefit from neoadjuvant therapy. She will need a port placed for this. I have discussed with her in detail the risks and benefits of the operation to place a port as well as some of the technical aspects including the risk of pneumothorax and she understands and wishes to proceed. If we cannot get this done in a timely fashion we will have interventional radiology place it. I will see her back during her treatment to monitor her progress and answer any questions. She may be favoring mastectomy which is reasonable given the dermal changes and hopefully can downgrade the axilla so it can be targeted but we will see how she responds.

## 2024-03-31 NOTE — Telephone Encounter (Signed)
 [employer] 2021-05 - Specimen Collection Study to Evaluate Biomarkers in Subjects with Cancer    Attempted to reach pt this afternoon regarding f/u for the [employer] research study. Pt did not pick up the phone and voice mail box was full so unable to leave a message.  This nurse called her significant other [name] and LVM for return cal.   Monica Larsen, RN, BSN Clinical Research Nurse [phone] 03/31/2024

## 2024-03-31 NOTE — Anesthesia Procedure Notes (Signed)
 Procedure Name: LMA Insertion Date/Time: 03/31/2024 7:40 AM  Performed by: Joshua Vernell BROCKS, CRNAPre-anesthesia Checklist: Patient identified, Emergency Drugs available, Suction available and Patient being monitored Patient Re-evaluated:Patient Re-evaluated prior to induction Oxygen Delivery Method: Circle system utilized Preoxygenation: Pre-oxygenation with 100% oxygen Induction Type: IV induction Ventilation: Mask ventilation without difficulty LMA Size: 4.0 Number of attempts: 1 Placement Confirmation: positive ETCO2 and breath sounds checked- equal and bilateral Tube secured with: Tape Dental Injury: Teeth and Oropharynx as per pre-operative assessment

## 2024-03-31 NOTE — Op Note (Signed)
 03/31/2024  8:29 AM  PATIENT:  Monica Holland  38 y.o. female  PRE-OPERATIVE DIAGNOSIS:  LEFT BREAST CANCER  POST-OPERATIVE DIAGNOSIS:  LEFT BREAST CANCER  PROCEDURE:  Procedures with comments: INSERTION, TUNNELED CENTRAL VENOUS DEVICE, WITH PORT (N/A) - PORT PLACEMENT WITH ULTRASOUND GUIDANCE  SURGEON:  Surgeons and Role:    * Curvin [name], MD - Primary  PHYSICIAN ASSISTANT:   ASSISTANTS: none   ANESTHESIA:   local and general  EBL:  5 mL   BLOOD ADMINISTERED:none  DRAINS: none   LOCAL MEDICATIONS USED:  MARCAINE      SPECIMEN:  No Specimen  DISPOSITION OF SPECIMEN:  N/A  COUNTS:  YES  TOURNIQUET:  * No tourniquets in log *  DICTATION: .Dragon Dictation  After informed consent was obtained the patient was brought to the operating room and placed in the supine position on the operating table.  After adequate induction of general anesthesia a roll was placed between the patient's shoulder blades to extend the shoulder slightly.  The patient's right chest and neck area were then prepped with ChloraPrep, allowed to dry, and draped in usual sterile manner.  An appropriate timeout was performed.  The area lateral to the bend of the clavicle on the right chest wall was infiltrated with quarter percent Marcaine .  The patient was placed in Trendelenburg position.  I tried 1 pass of the needle below the bend of the clavicle but there was no space so I used the ultrasound to identify the right internal jugular vein.  The large bore needle from the Port-A-Cath kit was used to cannulate the right internal jugular vein under ultrasound guidance.  A wire was fed through the needle using the Seldinger technique without difficulty.  The wire was confirmed in the central venous system using real-time fluoroscopy.  Next a small incision was made on the right chest wall and a small incision was made at the wire entry site on the right neck.  The incision on the right chest wall was carried  through the skin and subcutaneous tissue sharply with the electrocautery.  A subcutaneous pocket was created inferior to the incision by blunt finger dissection.  Next blunt hemostat dissection was used to connect the 2 incisions.  A tendon passer was placed across the tunnel and used to bring the tubing through the tunnel.  The tubing was then placed on the reservoir.  The reservoir was placed in the pocket and the length of the tubing was estimated using real-time fluoroscopy.  The tubing was cut to the appropriate length.  Next a sheath and dilator were fed over the wire using the Seldinger technique without difficulty.  The dilator and wire were removed from the patient.  The tubing was fed through the sheath as far as it would go and then held in place while the sheath was gently cracked and separated.  Another real-time fluoroscopy image showed the tip of the catheter to be in the distal superior vena cava.  The tubing was then permanently anchored to the reservoir.  The reservoir was anchored in the pocket with two 2-0 Prolene stitches.  The port was then aspirated and it aspirated blood easily.  The port was then flushed initially with a dilute heparin  solution and then with a more concentrated heparin  solution.  The skin incision on the right neck was closed with a single interrupted 4-0 Monocryl subcuticular stitch.  The subcutaneous tissue was closed over the port with interrupted 3-0 Vicryl stitches.  The  skin was then closed with interrupted 4-0 Monocryl subcuticular stitches.  Dermabond dressings were applied.  The patient tolerated the procedure well.  At the end of the case all needle sponge and instrument counts were correct.  The patient was then awakened and taken to recovery in stable condition.  PLAN OF CARE: Discharge to home after PACU  PATIENT DISPOSITION:  PACU - hemodynamically stable.   Delay start of Pharmacological VTE agent (>24hrs) due to surgical blood loss or risk of bleeding:  not applicable

## 2024-03-31 NOTE — Transfer of Care (Signed)
 Immediate Anesthesia Transfer of Care Note  Patient: Monica Holland  Procedure(s) Performed: Procedures with comments: INSERTION, TUNNELED CENTRAL VENOUS DEVICE, WITH PORT (N/A) - PORT PLACEMENT WITH ULTRASOUND GUIDANCE  Patient Location: PACU  Anesthesia Type:General  Level of Consciousness: Patient easily awoken, comfortable, cooperative, following commands, responds to stimulation.   Airway & Oxygen Therapy: Patient spontaneously breathing, ventilating well, oxygen via simple oxygen mask.  Post-op Assessment: Report given to PACU RN, vital signs reviewed and stable, moving all extremities.   Post vital signs: Reviewed and stable.  Complications: No apparent anesthesia complications Last Vitals:  Vitals Value Taken Time  BP 145/104 03/31/24 08:32  Temp    Pulse 79 03/31/24 08:36  Resp 16 03/31/24 08:36  SpO2 100 % 03/31/24 08:36  Vitals shown include unfiled device data.  Last Pain:  Vitals:   03/31/24 0614  TempSrc: Oral  PainSc:          Complications: No notable events documented.

## 2024-03-31 NOTE — Interval H&P Note (Signed)
 History and Physical Interval Note:  03/31/2024 7:13 AM  Monica Holland  has presented today for surgery, with the diagnosis of LEFT BREAST CANCER.  The various methods of treatment have been discussed with the patient and family. After consideration of risks, benefits and other options for treatment, the patient has consented to  Procedures with comments: INSERTION, TUNNELED CENTRAL VENOUS DEVICE, WITH PORT (N/A) - PORT PLACEMENT WITH ULTRASOUND GUIDANCE as a surgical intervention.  The patient's history has been reviewed, patient examined, no change in status, stable for surgery.  I have reviewed the patient's chart and labs.  Questions were answered to the patient's satisfaction.     Deward Null III

## 2024-04-01 ENCOUNTER — Encounter (HOSPITAL_COMMUNITY): Payer: Self-pay | Admitting: General Surgery

## 2024-04-01 ENCOUNTER — Inpatient Hospital Stay: Admitting: Pharmacist

## 2024-04-01 ENCOUNTER — Ambulatory Visit (HOSPITAL_COMMUNITY)

## 2024-04-01 ENCOUNTER — Other Ambulatory Visit (HOSPITAL_COMMUNITY): Payer: Self-pay

## 2024-04-01 ENCOUNTER — Inpatient Hospital Stay

## 2024-04-01 NOTE — Progress Notes (Signed)
 Pharmacist Chemotherapy Monitoring - Initial Assessment    Anticipated start date: 04/08/24   The following has been reviewed per standard work regarding the patient's treatment regimen: The patient's diagnosis, treatment plan and drug doses, and organ/hematologic function Lab orders and baseline tests specific to treatment regimen  The treatment plan start date, drug sequencing, and pre-medications Prior authorization status  Patient's documented medication list, including drug-drug interaction screen and prescriptions for anti-emetics and supportive care specific to the treatment regimen The drug concentrations, fluid compatibility, administration routes, and timing of the medications to be used The patient's access for treatment and lifetime cumulative dose history, if applicable  The patient's medication allergies and previous infusion related reactions, if applicable   Changes made to treatment plan:  N/A  Follow up needed:  F/U ECHO result - sched for 04/03/24   Wilma Dollar, Pharm.D., CPP 04/01/2024@3 :13 PM

## 2024-04-01 NOTE — Telephone Encounter (Signed)
 ECG is normal.  I sent her note.  Does not need to be seen in clinic.

## 2024-04-01 NOTE — Anesthesia Postprocedure Evaluation (Signed)
 Anesthesia Post Note  Patient: Monica Holland  Procedure(s) Performed: INSERTION, TUNNELED CENTRAL VENOUS DEVICE, WITH PORT     Patient location during evaluation: PACU Anesthesia Type: General Level of consciousness: awake and alert Pain management: pain level controlled Vital Signs Assessment: post-procedure vital signs reviewed and stable Respiratory status: spontaneous breathing, nonlabored ventilation, respiratory function stable and patient connected to nasal cannula oxygen Cardiovascular status: blood pressure returned to baseline and stable Postop Assessment: no apparent nausea or vomiting Anesthetic complications: no   No notable events documented.  Last Vitals:  Vitals:   03/31/24 1009 03/31/24 1030  BP: (!) 148/104 (!) 138/97  Pulse:  (!) 55  Resp: 16   Temp: 36.5 C   SpO2:  100%    Last Pain:  Vitals:   03/31/24 1015  TempSrc:   PainSc: 5    Pain Goal:                   Quentina Fronek L Nyia Tsao

## 2024-04-02 ENCOUNTER — Inpatient Hospital Stay

## 2024-04-02 ENCOUNTER — Telehealth: Payer: Self-pay | Admitting: *Deleted

## 2024-04-02 ENCOUNTER — Other Ambulatory Visit: Payer: Self-pay | Admitting: *Deleted

## 2024-04-02 ENCOUNTER — Encounter: Payer: Self-pay | Admitting: Family

## 2024-04-02 ENCOUNTER — Inpatient Hospital Stay: Admitting: Pharmacist

## 2024-04-02 ENCOUNTER — Encounter: Payer: Self-pay | Admitting: *Deleted

## 2024-04-02 DIAGNOSIS — C50412 Malignant neoplasm of upper-outer quadrant of left female breast: Secondary | ICD-10-CM

## 2024-04-02 DIAGNOSIS — Z5111 Encounter for antineoplastic chemotherapy: Secondary | ICD-10-CM | POA: Diagnosis not present

## 2024-04-02 LAB — RESEARCH LABS

## 2024-04-02 NOTE — Telephone Encounter (Signed)
 [employer] 2021-05 - Specimen Collection Study to Evaluate Biomarkers in Subjects with Cancer    Spoke with pt over the phone this morning who stated that she would like to come in today and participate in the above study. She stated her wife is also interested and would like to consent and get blood drawn today as well. Pt is scheduled for 3pm for consent and blood draw for above study. She knows to arrive to the cancer center at [facility].   [name] Larsen, RN, BSN Clinical Research Nurse [phone] 04/02/2024

## 2024-04-02 NOTE — Research (Unsigned)
 [employer] 2021-05 - Specimen Collection Study to Evaluate Biomarkers in Subjects with Cancer    Patient Monica Holland was identified by Dr.Feng as a potential candidate for the above listed study.  This Clinical Research Nurse met with CARLOYN LAHUE, [id number] on 04/03/2024 in a manner and location that ensures patient privacy to discuss participation in the above listed research study.  Patient is Accompanied by wife.  Patient was previously provided with informed consent documents.  Patient confirmed they have read the informed consent documents.  As outlined in the informed consent form, this Nurse and Tallyn J Banko discussed the purpose of the research study, the investigational nature of the study, study procedures and requirements for study participation, potential risks and benefits of study participation, as well as alternatives to participation.  This study is not blinded or double-blinded. The patient understands participation is voluntary and they may withdraw from study participation at any time.  This study does not involve randomization.  This study does not involve an investigational drug or device. This study does not involve a placebo. Patient understands enrollment is pending full eligibility review.   Confidentiality and how the patient's information will be used as part of study participation were discussed.  Patient was informed there is reimbursement provided for their time and effort spent on trial participation.  The patient is encouraged to discuss research study participation with their insurance provider to determine what costs they may incur as part of study participation, including research related injury.    All questions were answered to patient's satisfaction.  The informed consent with embedded HIPAA language was reviewed page by page.  The patient's mental and emotional status is appropriate to provide informed consent, and the patient verbalizes an  understanding of study participation.  Patient has agreed to participate in the above listed research study and has voluntarily signed the informed consent version [employer] Approved Version 18 Dec 2023 with embedded HIPAA language, version [employer] Approved Version 18 Dec 2023 on 04/03/2024 at 3:10PM.  The patient was provided with a copy of the signed informed consent form with embedded HIPAA language for their reference.  No study specific procedures were obtained prior to the signing of the informed consent document.  Approximately 15 minutes were spent with the patient reviewing the informed consent documents.  Patient was not requested to complete a Release of Information form.   Eligibility: Eligibility criteria reviewed with patient. This nurse/coordinator has reviewed this patient's inclusion and exclusion criteria and confirmed patient is eligible for study participation. Eligibility confirmed by treating investigator, Dr.Feng , who also agrees that patient should proceed with enrollment.  Patient will continue with enrollment.  Medical History: This RN/Coordinator reviewed the medical history as reported in the patient's medical record with the participant.  In addition, the participant was asked to report any new medical conditions not previously recorded on the medical history form.   Was the current medical history form correct?   Yes Are there any new medical conditions to report?  No   Based on the review of the medical chart and the patient's responses, all reportable medical history events will be entered for study reporting purposes.     Data Collection: Patient was interviewed to collect the following information.  Does participant have a history of: High Blood Pressure   No  Has participant been diagnosed with: Coronary Artery Disease                No Myocardial  Infarction                       No Congestive Heart Failure               No Peripheral Vascular Disease           No Cerebrovascular Disease              No Chronic Pulmonary Disease             No COPD (incl Emphysema,Chronic Bronchitis)    No Lupus                               No Rheumatoid Arthritis         No Rheumatoid Disease         No  Does participant have a history of: Diabetes                  No  Has participant been diagnosed with: Dementia                         No Hemiplegia or Paraplegia No Barrett's Esophagus       No Gastric Ulcer                 No Peptic Ulcer Disease      No Mild Liver Disease           No Moderate or Severe Liver Disease  No Liver Cirrhosis                                 No Helicobacter Pylori (H. Pylori)          No Pancreatitis                                       No Renal Disease                                No Chronic Kidney Disease (CKD)   No  Ulcerative Colitis     No Crohn's Disease    No Colorectal Polyps   No Lynch Syndrome    No Hepatitis B or C     No   Is the participant currently taking a magnesium supplement?   No  Does the participant have a personal history of cancer (greater than 5 years ago)?  No   Does the participant have a family history of cancer in 1st or 2nd degree relatives? Yes If yes, Relationship(s) and Cancer type(s)?  Aunt-breast cancer Grandmother- colon cancer  Does the participant have history of alcohol consumption? Yes Pt stopped drinking in 2025 She drank for 24 years She drank 24 beers a week    Does the participant have a history of cigarette, cigar, pipe, or chewing tobacco use?  Yes  If yes, current for former? former If yes, type (Cigarette, cigar, pipe, and/or chewing tobacco)? Cigarette  If former, year stopped? 2025 Number of years? 30 Packs/number/containers per day? 2 pack a day  Blood Collection: Research blood obtained by fresh venipuncture.  Patient tolerated well without any adverse events.  Gift Card: $50 gift card given to patient by  Clinical Research Specialist  Almarie Harden for her participation in this study.      Patient was thanked for their participation in this study.    [name] Larsen, RN, BSN Clinical Research Nurse [phone] 04/02/2024

## 2024-04-02 NOTE — Research (Unsigned)
 [employer] 2021-05 - Specimen Collection Study to Evaluate Biomarkers in Subjects with Cancer    This Coordinator has reviewed this patient's inclusion and exclusion criteria as a second review and confirms patient is eligible for study participation. Patient may continue with enrollment.  Laury Quale, MPH  Clinical Research Coordinator

## 2024-04-02 NOTE — Progress Notes (Signed)
 [facility]        Telephone: [phone]?Fax: [phone]   Oncology Clinical Pharmacist Practitioner Initial Assessment   Monica Holland is a 38 y.o. female with a diagnosis of breast cancer. They were contacted today via in-person visit. She is accompanied by her wife  Indication/Regimen Doxorubicin (Adriamycin) and cyclophosphamide (Cytoxan) followed by paclitaxel (Taxol) are being used appropriately for treatment of breast cancer by Dr. Onita Mattock.      Wt Readings from Last 1 Encounters:  03/31/24 177 lb 14.6 oz (80.7 kg)    Estimated body surface area is 2.01 meters squared as calculated from the following:   Height as of 03/31/24: 5' 11 (1.803 m).   Weight as of 03/31/24: 177 lb 14.6 oz (80.7 kg).  The dosing regimen is every 14 days for 4 cycles  Doxorubicin (60 mg/m2) on Day 1 Cyclophosphamide (600 mg/m2) on Day 1 Pegfilgrastim (6 mg) on Day 3  Followed by a dosing regimen that is every 7 days for 12 cycles  Paclitaxel (80 mg/m2) on Day 1  It is planned to continue until treatment plan completion or unacceptable toxicity. The tentative start date is: 04/08/24  Dose Modifications None   Allergies Allergies[1]  Vitals    03/31/2024   10:30 AM 03/31/2024   10:09 AM 03/31/2024   10:00 AM  Oncology Vitals  Temp  97.7 F (36.5 C)   Pulse Rate 55  52  BP 138/97 148/104 136/99  Resp  16 16  SpO2 100 %  97 %     Laboratory Data    Latest Ref Rng & Units 03/26/2024    1:01 PM 03/17/2024    1:53 PM 03/17/2024    1:48 PM  CBC EXTENDED  WBC 4.0 - 10.5 K/uL 4.2   5.3   RBC 3.87 - 5.11 MIL/uL 4.89   5.28   Hemoglobin 12.0 - 15.0 g/dL 86.2  83.9  85.3   HCT 36.0 - 46.0 % 40.7  47.0  45.0   Platelets 150 - 400 K/uL 232   231   NEUT# 1.7 - 7.7 K/uL 1.9   2.5   Lymph# 0.7 - 4.0 K/uL 1.9   2.3        Latest Ref Rng & Units 03/26/2024    1:01 PM 03/17/2024    1:53 PM 03/17/2024    1:48 PM  CMP  Glucose 70 - 99 mg/dL 93  898  892    BUN 6 - 20 mg/dL 9  8  8    Creatinine 0.44 - 1.00 mg/dL 9.14  8.99  9.04   Sodium 135 - 145 mmol/L 141  140  139   Potassium 3.5 - 5.1 mmol/L 4.2  4.3  4.2   Chloride 98 - 111 mmol/L 106  100  102   CO2 22 - 32 mmol/L 30   28   Calcium 8.9 - 10.3 mg/dL 9.6   9.0   Total Protein 6.5 - 8.1 g/dL 7.2     Total Bilirubin 0.0 - 1.2 mg/dL 0.8     Alkaline Phos 38 - 126 U/L 64     AST 15 - 41 U/L 20     ALT 0 - 44 U/L 16       Contraindications Contraindications were reviewed? Yes Contraindications to therapy were identified? No   Safety Precautions The following safety precautions were reviewed:  Fever: reviewed the importance of having a thermometer and the Centers for Disease Control and Prevention (  CDC) definition of fever which is 100.31F (38C) or higher. Patient should call 24/7 triage at [phone] if experiencing a fever or any other symptoms Decreased white blood cells (WBCs) and increased risk for infection Decreased platelet count and increased risk of bleeding Decreased hemoglobin, part of the red blood cells that carry iron and oxygen Change in the color of urine Fatigue Hair loss Nausea or vomiting Mouth irritation or sores Doxorubicin  (vesicant) Cardiotoxicity Hemorrhagic cystitis Secondary cancers Muscle or joint pain or weakness Peripheral neuropathy Hypersensitivity reactions Avoid grapefruit products Intimacy, sexual activity, contraception, fertility Handling body fluids and waste  Medication Reconciliation Current Outpatient Medications  Medication Sig Dispense Refill   albuterol  (VENTOLIN  HFA) 108 (90 Base) MCG/ACT inhaler Inhale 1-2 puffs into the lungs every 6 (six) hours as needed for wheezing or shortness of breath. 8 g 0   apixaban  (ELIQUIS ) 5 MG TABS tablet Take 1 tablet (5 mg total) by mouth 2 (two) times daily. 60 tablet 3   dicyclomine  (BENTYL ) 20 MG tablet Take 1 tablet (20 mg total) by mouth 2 (two) times daily as needed. 20 tablet 0    fluticasone  (FLONASE ) 50 MCG/ACT nasal spray Place 2 sprays into both nostrils daily. 9.9 mL 2   lidocaine  (LIDODERM ) 5 % Place 1 patch onto the skin daily. Remove & Discard patch within 12 hours or as directed by MD 30 patch 0   lidocaine  (XYLOCAINE ) 5 % ointment Apply 1 Application topically as needed. 35.44 g 0   oxyCODONE  (ROXICODONE ) 5 MG immediate release tablet Take 1 tablet (5 mg total) by mouth every 6 (six) hours as needed. 5 tablet 0   pantoprazole  (PROTONIX ) 40 MG tablet Take 1 tablet (40 mg total) by mouth 2 (two) times daily. 60 tablet 5   tiZANidine  (ZANAFLEX ) 2 MG tablet Take 1 tablet (2 mg total) by mouth every 8 (eight) hours as needed for muscle spasms. 30 tablet 0   dexamethasone  (DECADRON ) 4 MG tablet Take 2 tablets (8 mg total) by mouth daily for 3 days. Start the day after doxorubicin /cyclophosphamide  chemotherapy. Take with food. (Patient not taking: Reported on 04/02/2024) 30 tablet 1   lidocaine -prilocaine  (EMLA ) cream Apply to affected area once (Patient not taking: Reported on 04/02/2024) 30 g 3   ondansetron  (ZOFRAN ) 8 MG tablet Take 1 tab (8 mg) by mouth every 8 hrs as needed for nausea/vomiting. Start third day after doxorubicin /cyclophosphamide  chemotherapy. (Patient not taking: Reported on 04/02/2024) 30 tablet 1   prochlorperazine  (COMPAZINE ) 10 MG tablet Take 1 tablet (10 mg total) by mouth every 6 (six) hours as needed for nausea or vomiting. (Patient not taking: Reported on 04/02/2024) 30 tablet 1   No current facility-administered medications for this visit.   Medication reconciliation is based on the patient's most recent medication list in the electronic medical record (EMR) including herbal products and OTC medications.   The patient's medication list was reviewed today with the patient? Yes   Drug-drug interactions (DDIs) DDIs were evaluated? Yes Significant DDIs identified? No   Drug-Food Interactions Drug-food interactions were evaluated?  Yes Drug-food interactions identified? Avoid grapefruit products  Follow-up Plan  Treatment start date: 04/08/24 Port placement date: 03/31/24 ECHO date: 04/03/24 We reviewed the prescriptions, premedications, and treatment regimen with the patient. Possible side effects of the treatment regimen were reviewed and management strategies were discussed.  Can use over-the-counter (OTC) options of loperamide  (Imodium ) as needed for diarrhea, loratadine (Claritin) as needed for G-CSF bone pain, and docusate + senna (Senna-S) as needed  for constipation.  Clinical pharmacy will assist Dr. Onita Mattock and Monica Holland on an as needed basis going forward  [employer] participated in the discussion, expressed understanding, and voiced agreement with the above plan. All questions were answered to her satisfaction. The patient was advised to contact the clinic at [phone] with any questions or concerns prior to her return visit.   I spent 60 minutes assessing the patient.  Sascha Baugher A. Holland, PharmD, BCOP, CPP  Monica Holland, RPH-CPP, 04/02/2024 4:59 PM  **Disclaimer: This note was dictated with voice recognition software. Similar sounding words can inadvertently be transcribed and this note may contain transcription errors which may not have been corrected upon publication of note.**     [1]  Allergies Allergen Reactions   Nsaids Other (See Comments)    Eliquis 

## 2024-04-03 ENCOUNTER — Encounter: Payer: Self-pay | Admitting: *Deleted

## 2024-04-03 ENCOUNTER — Encounter: Payer: Self-pay | Admitting: Hematology

## 2024-04-03 ENCOUNTER — Ambulatory Visit: Payer: Self-pay | Admitting: General Surgery

## 2024-04-03 ENCOUNTER — Other Ambulatory Visit (HOSPITAL_COMMUNITY): Payer: Self-pay | Admitting: General Surgery

## 2024-04-03 ENCOUNTER — Inpatient Hospital Stay: Admitting: Licensed Clinical Social Worker

## 2024-04-03 ENCOUNTER — Telehealth: Payer: Self-pay | Admitting: *Deleted

## 2024-04-03 ENCOUNTER — Ambulatory Visit (HOSPITAL_COMMUNITY): Admission: RE | Admit: 2024-04-03 | Discharge: 2024-04-03 | Attending: Hematology

## 2024-04-03 ENCOUNTER — Other Ambulatory Visit (HOSPITAL_COMMUNITY): Payer: Self-pay

## 2024-04-03 ENCOUNTER — Ambulatory Visit (HOSPITAL_COMMUNITY): Admission: RE | Admit: 2024-04-03 | Source: Ambulatory Visit

## 2024-04-03 DIAGNOSIS — I071 Rheumatic tricuspid insufficiency: Secondary | ICD-10-CM | POA: Insufficient documentation

## 2024-04-03 DIAGNOSIS — I361 Nonrheumatic tricuspid (valve) insufficiency: Secondary | ICD-10-CM | POA: Diagnosis not present

## 2024-04-03 DIAGNOSIS — C50412 Malignant neoplasm of upper-outer quadrant of left female breast: Secondary | ICD-10-CM | POA: Insufficient documentation

## 2024-04-03 DIAGNOSIS — R079 Chest pain, unspecified: Secondary | ICD-10-CM | POA: Diagnosis not present

## 2024-04-03 DIAGNOSIS — Z170 Estrogen receptor positive status [ER+]: Secondary | ICD-10-CM | POA: Insufficient documentation

## 2024-04-03 LAB — ECHOCARDIOGRAM COMPLETE
Area-P 1/2: 2.76 cm2
Calc EF: 55.5 %
S' Lateral: 3.15 cm
Single Plane A2C EF: 59.3 %
Single Plane A4C EF: 53.8 %

## 2024-04-03 NOTE — Progress Notes (Signed)
 [facility] Clinical Social Work  Initial Assessment   Monica Holland is a 38 y.o. year old female contacted by phone. Clinical Social Work was referred by medical provider for assessment of psychosocial needs.   SDOH (Social Determinants of Health) assessments performed: Yes   SDOH Screenings   Food Insecurity: Food Insecurity Present (03/26/2024)  Housing: High Risk (03/26/2024)  Transportation Needs: No Transportation Needs (03/26/2024)  Utilities: Not At Risk (03/26/2024)  Depression (PHQ2-9): Medium Risk (03/26/2024)  Tobacco Use: High Risk (03/31/2024)    PHQ 2/9:    03/26/2024    5:28 PM 11/28/2021    1:43 PM 10/26/2021    4:03 PM  Depression screen PHQ 2/9  Decreased Interest 2 2 0  Down, Depressed, Hopeless 3 0 0  PHQ - 2 Score 5 2 0  Altered sleeping  1 1  Tired, decreased energy  1 1  Change in appetite  1 2  Feeling bad or failure about yourself   0 0  Trouble concentrating  0 0  Moving slowly or fidgety/restless  0 0  Suicidal thoughts  0 0  PHQ-9 Score  5  4      Data saved with a previous flowsheet row definition     Distress Screen completed: Yes    04/02/2024    6:40 PM  ONCBCN DISTRESS SCREENING  Screening Type Initial Screening  How much distress have you been experiencing in the past week? (0-10) 10  Practical concerns type Taking care of myself;Taking care of others;Work;Housing/Utilities;Transportation  Social concerns type Relationship with spouse or partner;Relationship with children;Relationship with family members;Relationship with friends or coworkers  Emotional concerns type Worry or anxiety;Sadness or depression;Loss of interest or enjoyment;Grief or loss;Fear;Loneliness;Anger;Changes in appearance;Feelings of worthlessness or being a burden  Physical Concerns Type  Pain;Sleep;Changes in eating;Loss or change of physical abilities      Family/Social Information:  Housing Arrangement: patient lives with her significant other and 74 y/o  child.   Family members/support persons in your life? Pt has lost her brother, sister, mother and father within the last year and has very limited support.  Pt's significant other will be her primary support; however, since they are not married her significant other is not able to take FMLA to provide support at home.  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  Pt is still receiving worker's comp checks weekly; however, that income is limited.  Pt was preparing to go back to work w/ partial duties at time of diagnosis.  Pt has been advised to not file a short term disability claim until mediation is complete in February for the worker's comp claim.   Income source: [employer] Oper[employer] concerns: Yes, current concerns Type of concern: Utilities and Rent/ mortgage Food access concerns: no Religious or spiritual practice: Yes-  Advanced directives: No Services Currently in place:  none  Coping/ Adjustment to diagnosis: Patient understands treatment plan and what happens next? yes Concerns about diagnosis and/or treatment: Losing my job and/or losing income, Overwhelmed by information, Afraid of cancer, and How I will pay for the services I need Patient reported stressors: Finances, Anxiety/ nervousness, and Adjusting to my illness Hopes and/or priorities: pt's priority is to start treatment w/ the hope of positive results Patient enjoys time with family/ friends Current coping skills/ strengths: Capable of independent living , Motivation for treatment/growth ,  Physical Health , and Supportive family/friends     SUMMARY: Current SDOH Barriers:  Financial constraints related to limited income.  Clinical Social Work Clinical Goal(s):  Explore community resource options for unmet needs related to:  Financial Strain   Interventions: Discussed  common feeling and emotions when being diagnosed with cancer, and the importance of support during treatment Informed patient of the support team roles and support services at [facility] Provided CSW contact information and encouraged patient to call with any questions or concerns Pt referred for an Art Gallery Manager Guide following breast clinic.  CSW to submit an application on behalf of pt to Monica Holland.  Referral sent to Cancer Services on behalf of pt.  Pt informed of the [employer] and how to apply.  Pt is a recipient of SNAP benefits and meets presumptive eligibility requirements to receive the grant.  CSW discussed counseling w/ pt and will meet pt in person at infusion on 1/6 to follow up.     Follow Up Plan: Patient will contact CSW with any support or resource needs Patient verbalizes understanding of plan: Yes    Monica JONELLE Manna, LCSW Clinical Social Worker [facility]  Patient is participating in a Managed Medicaid Plan:  Yes

## 2024-04-03 NOTE — Telephone Encounter (Signed)
 Spoke with patient to follow up from [facility] and assess navigation needs. Patient states she isn't feeling too well today. She just had her port placed 12/15 and is having some discomfort. She tells me she called Dr. Yvonne office and they sent her for a chest xray which was normal.  She has been going to several appts and hasn't had time to really rest.  Reviewed appts and let her know that she will most likely have some discomfort for a week or 2 after having the port placed. Encouraged her to take some Tylenol  to help with the pain. Patient verbalized understanding.  Encouraged her to call should she have any other questions or concerns.

## 2024-04-03 NOTE — Progress Notes (Signed)
°  Echocardiogram 2D Echocardiogram has been performed.  Monica Holland 04/03/2024, 10:05 AM

## 2024-04-04 ENCOUNTER — Ambulatory Visit (HOSPITAL_COMMUNITY): Admission: RE | Admit: 2024-04-04 | Discharge: 2024-04-04 | Attending: Hematology

## 2024-04-04 ENCOUNTER — Encounter (HOSPITAL_COMMUNITY)
Admission: RE | Admit: 2024-04-04 | Discharge: 2024-04-04 | Disposition: A | Discharge status: Home / Self Care | Source: Ambulatory Visit | Attending: Hematology | Admitting: Hematology

## 2024-04-04 ENCOUNTER — Ambulatory Visit (HOSPITAL_COMMUNITY)
Admission: RE | Admit: 2024-04-04 | Discharge: 2024-04-04 | Disposition: A | Discharge status: Home / Self Care | Source: Ambulatory Visit | Attending: Hematology | Admitting: Hematology

## 2024-04-04 ENCOUNTER — Other Ambulatory Visit (HOSPITAL_COMMUNITY): Payer: Self-pay

## 2024-04-04 ENCOUNTER — Encounter (HOSPITAL_COMMUNITY): Payer: Self-pay

## 2024-04-04 DIAGNOSIS — C50412 Malignant neoplasm of upper-outer quadrant of left female breast: Secondary | ICD-10-CM

## 2024-04-04 DIAGNOSIS — Z170 Estrogen receptor positive status [ER+]: Secondary | ICD-10-CM | POA: Diagnosis present

## 2024-04-04 MED ORDER — TECHNETIUM TC 99M MEDRONATE IV KIT
20.0000 | PACK | Freq: Once | INTRAVENOUS | Status: AC | PRN
  Administered 2024-04-04: 21.1 via INTRAVENOUS

## 2024-04-04 MED ORDER — GADOBUTROL 1 MMOL/ML IV SOLN
8.0000 mL | Freq: Once | INTRAVENOUS | Status: AC | PRN
  Administered 2024-04-04: 8 mL via INTRAVENOUS

## 2024-04-04 MED ORDER — IOHEXOL 300 MG/ML  SOLN
100.0000 mL | Freq: Once | INTRAMUSCULAR | Status: AC | PRN
  Administered 2024-04-04: 100 mL via INTRAVENOUS

## 2024-04-07 ENCOUNTER — Other Ambulatory Visit: Payer: Self-pay | Admitting: Family

## 2024-04-07 ENCOUNTER — Other Ambulatory Visit: Payer: Self-pay

## 2024-04-07 DIAGNOSIS — Z170 Estrogen receptor positive status [ER+]: Secondary | ICD-10-CM

## 2024-04-07 DIAGNOSIS — I82512 Chronic embolism and thrombosis of left femoral vein: Secondary | ICD-10-CM

## 2024-04-07 DIAGNOSIS — K219 Gastro-esophageal reflux disease without esophagitis: Secondary | ICD-10-CM

## 2024-04-07 DIAGNOSIS — E559 Vitamin D deficiency, unspecified: Secondary | ICD-10-CM

## 2024-04-07 MED ORDER — PANTOPRAZOLE SODIUM 40 MG PO TBEC: 40.0000 mg | DELAYED_RELEASE_TABLET | Freq: Two times a day (BID) | ORAL | 1 refills | Status: DC

## 2024-04-07 MED FILL — Fosaprepitant Dimeglumine For IV Infusion 150 MG (Base Eq): INTRAVENOUS | Qty: 5 | Status: AC

## 2024-04-08 ENCOUNTER — Inpatient Hospital Stay

## 2024-04-08 ENCOUNTER — Telehealth: Payer: Self-pay | Admitting: Family

## 2024-04-08 ENCOUNTER — Encounter: Payer: Self-pay | Admitting: Hematology

## 2024-04-08 ENCOUNTER — Ambulatory Visit (HOSPITAL_COMMUNITY)

## 2024-04-08 ENCOUNTER — Inpatient Hospital Stay: Admitting: Pharmacist

## 2024-04-08 ENCOUNTER — Other Ambulatory Visit: Payer: Self-pay

## 2024-04-08 ENCOUNTER — Inpatient Hospital Stay: Admitting: Hematology

## 2024-04-08 ENCOUNTER — Encounter: Payer: Self-pay | Admitting: Licensed Clinical Social Worker

## 2024-04-08 VITALS — BP 121/86 | HR 80 | Resp 16

## 2024-04-08 VITALS — BP 119/82 | HR 80 | Temp 97.5°F | Resp 17 | Wt 187.4 lb

## 2024-04-08 DIAGNOSIS — Z170 Estrogen receptor positive status [ER+]: Secondary | ICD-10-CM

## 2024-04-08 DIAGNOSIS — I82512 Chronic embolism and thrombosis of left femoral vein: Secondary | ICD-10-CM

## 2024-04-08 DIAGNOSIS — N6310 Unspecified lump in the right breast, unspecified quadrant: Secondary | ICD-10-CM | POA: Diagnosis not present

## 2024-04-08 DIAGNOSIS — C50412 Malignant neoplasm of upper-outer quadrant of left female breast: Secondary | ICD-10-CM

## 2024-04-08 DIAGNOSIS — E559 Vitamin D deficiency, unspecified: Secondary | ICD-10-CM

## 2024-04-08 DIAGNOSIS — Z5111 Encounter for antineoplastic chemotherapy: Secondary | ICD-10-CM | POA: Diagnosis not present

## 2024-04-08 DIAGNOSIS — K219 Gastro-esophageal reflux disease without esophagitis: Secondary | ICD-10-CM

## 2024-04-08 LAB — CBC WITH DIFFERENTIAL (CANCER CENTER ONLY)
Abs Immature Granulocytes: 0.01 K/uL (ref 0.00–0.07)
Basophils Absolute: 0 K/uL (ref 0.0–0.1)
Basophils Relative: 0 %
Eosinophils Absolute: 0.1 K/uL (ref 0.0–0.5)
Eosinophils Relative: 3 %
HCT: 41.1 % (ref 36.0–46.0)
Hemoglobin: 13.8 g/dL (ref 12.0–15.0)
Immature Granulocytes: 0 %
Lymphocytes Relative: 40 %
Lymphs Abs: 1.9 K/uL (ref 0.7–4.0)
MCH: 27.7 pg (ref 26.0–34.0)
MCHC: 33.6 g/dL (ref 30.0–36.0)
MCV: 82.5 fL (ref 80.0–100.0)
Monocytes Absolute: 0.4 K/uL (ref 0.1–1.0)
Monocytes Relative: 8 %
Neutro Abs: 2.3 K/uL (ref 1.7–7.7)
Neutrophils Relative %: 49 %
Platelet Count: 172 K/uL (ref 150–400)
RBC: 4.98 MIL/uL (ref 3.87–5.11)
RDW: 15.1 % (ref 11.5–15.5)
WBC Count: 4.7 K/uL (ref 4.0–10.5)
nRBC: 0 % (ref 0.0–0.2)

## 2024-04-08 LAB — CMP (CANCER CENTER ONLY)
ALT: 14 U/L (ref 0–44)
AST: 18 U/L (ref 15–41)
Albumin: 4.5 g/dL (ref 3.5–5.0)
Alkaline Phosphatase: 67 U/L (ref 38–126)
Anion gap: 8 (ref 5–15)
BUN: 13 mg/dL (ref 6–20)
CO2: 26 mmol/L (ref 22–32)
Calcium: 9.2 mg/dL (ref 8.9–10.3)
Chloride: 106 mmol/L (ref 98–111)
Creatinine: 0.72 mg/dL (ref 0.44–1.00)
GFR, Estimated: >60 mL/min
Glucose, Bld: 100 mg/dL — ABNORMAL HIGH (ref 70–99)
Potassium: 4 mmol/L (ref 3.5–5.1)
Sodium: 140 mmol/L (ref 135–145)
Total Bilirubin: 0.4 mg/dL (ref 0.0–1.2)
Total Protein: 7.1 g/dL (ref 6.5–8.1)

## 2024-04-08 LAB — VITAMIN D 25 HYDROXY (VIT D DEFICIENCY, FRACTURES): Vit D, 25-Hydroxy: 6.5 ng/mL — ABNORMAL LOW (ref 30–100)

## 2024-04-08 MED ORDER — SODIUM CHLORIDE 0.9 % IV SOLN: INTRAVENOUS | Status: DC

## 2024-04-08 MED ORDER — DOXORUBICIN HCL CHEMO IV INJECTION 2 MG/ML
60.0000 mg/m2 | Freq: Once | INTRAVENOUS | Status: AC
  Administered 2024-04-08: 124 mg via INTRAVENOUS
  Filled 2024-04-08: qty 62

## 2024-04-08 MED ORDER — SODIUM CHLORIDE 0.9 % IV SOLN
150.0000 mg | Freq: Once | INTRAVENOUS | Status: AC
  Administered 2024-04-08: 150 mg via INTRAVENOUS
  Filled 2024-04-08: qty 150

## 2024-04-08 MED ORDER — PALONOSETRON HCL INJECTION 0.25 MG/5ML
0.2500 mg | Freq: Once | INTRAVENOUS | Status: AC
  Administered 2024-04-08: 0.25 mg via INTRAVENOUS
  Filled 2024-04-08: qty 5

## 2024-04-08 MED ORDER — DEXAMETHASONE SOD PHOSPHATE PF 10 MG/ML IJ SOLN
10.0000 mg | Freq: Once | INTRAMUSCULAR | Status: AC
  Administered 2024-04-08: 10 mg via INTRAVENOUS

## 2024-04-08 MED ORDER — SODIUM CHLORIDE 0.9 % IV SOLN
600.0000 mg/m2 | Freq: Once | INTRAVENOUS | Status: AC
  Administered 2024-04-08: 1240 mg via INTRAVENOUS
  Filled 2024-04-08: qty 60.86

## 2024-04-08 NOTE — Progress Notes (Signed)
 [facility] CSW Progress Note  Visual Merchandiser met with patient to follow-up on questions about paperwork. Pt's primary CSW is out today, this CSW is covering.  CSW provided information on what paperwork to provide for the [employer], who to give it to, and next steps with signing acknowledgement form.  Pt expressed understanding and will bring back the SNAP and income information.  CSW also provided the contact information for CANDIE Georgi if there are other [employer] questions.    Follow Up Plan:  CANDIE Manna plans to follow up with patient in January    Magie Ciampa E Lubertha Leite, LCSW Clinical Social Worker [facility]

## 2024-04-08 NOTE — Telephone Encounter (Signed)
 Called to reschedule appt per in[place]et. Pt agreeable to move up appt.

## 2024-04-08 NOTE — Progress Notes (Signed)
 Patient provided documents at registration to apply for [employer].  Patient approved for one-time $1000 [employer] grant to assist with personal expenses while going through treatment. She received a copy of the approval letter and expense sheet to review and discuss. She called and we discussed what was needed for other expense she wished to submit for. She verbalized understanding.  She has my card for any additional financial questions or concerns.

## 2024-04-08 NOTE — Research (Signed)
 [phone]: Complementary Options for Symptom Management In Cancer (COSMIC) Assessing Benefits and Harms of Cannabis and Cannabinoid Use Among a Cohort of Cancer Patients Treated in Community Oncology Clinics   This Nurse has reviewed this patient's inclusion and exclusion criteria as a second review and confirms Yareni J Vernon is eligible to continue with the study provided self-reported screening survey.   Cherylyn Hoard, BSN, RN, [employer] Research Nurse II [phone] 04/08/2024 9:33 AM

## 2024-04-08 NOTE — Patient Instructions (Signed)
 [facility] - A DEPT OF [facility]  Discharge Instructions: Thank you for choosing [facility] to provide your oncology and hematology care.   If you have a lab appointment with the Cancer Center, please go directly to the Cancer Center and check in at the registration area.   Wear comfortable clothing and clothing appropriate for easy access to any Portacath or PICC line.   We strive to give you quality time with your provider. You may need to reschedule your appointment if you arrive late (15 or more minutes).  Arriving late affects you and other patients whose appointments are after yours.  Also, if you miss three or more appointments without notifying the office, you may be dismissed from the clinic at the providers discretion.      For prescription refill requests, have your pharmacy contact our office and allow 72 hours for refills to be completed.    Today you received the following chemotherapy and/or immunotherapy agents: Doxorubicin  (Adriamycin ) and Cyclophosphamide  (Cytoxan )      To help prevent nausea and vomiting after your treatment, we encourage you to take your nausea medication as directed.  BELOW ARE SYMPTOMS THAT SHOULD BE REPORTED IMMEDIATELY: *FEVER GREATER THAN 100.4 F (38 C) OR HIGHER *CHILLS OR SWEATING *NAUSEA AND VOMITING THAT IS NOT CONTROLLED WITH YOUR NAUSEA MEDICATION *UNUSUAL SHORTNESS OF BREATH *UNUSUAL BRUISING OR BLEEDING *URINARY PROBLEMS (pain or burning when urinating, or frequent urination) *BOWEL PROBLEMS (unusual diarrhea, constipation, pain near the anus) TENDERNESS IN MOUTH AND THROAT WITH OR WITHOUT PRESENCE OF ULCERS (sore throat, sores in mouth, or a toothache) UNUSUAL RASH, SWELLING OR PAIN  UNUSUAL VAGINAL DISCHARGE OR ITCHING   Items with * indicate a potential emergency and should be followed up as soon as possible or go to the Emergency Department if any problems should occur.  Please show  the CHEMOTHERAPY ALERT CARD or IMMUNOTHERAPY ALERT CARD at check-in to the Emergency Department and triage nurse.  Should you have questions after your visit or need to cancel or reschedule your appointment, please contact [facility] - A DEPT OF [facility]  Dept: [phone]  and follow the prompts.  Office hours are 8:00 a.m. to 4:30 p.m. Monday - Friday. Please note that voicemails left after 4:00 p.m. may not be returned until the following business day.  We are closed weekends and major holidays. You have access to a nurse at all times for urgent questions. Please call the main number to the clinic Dept: [phone] and follow the prompts.   For any non-urgent questions, you may also contact your provider using MyChart. We now offer e-Visits for anyone 13 and older to request care online for non-urgent symptoms. For details visit [email or web address].   Also download the MyChart app! Go to the app store, search MyChart, open the app, select [facility], and log in with your MyChart username and password.  Cyclophosphamide  Injection What is this medication? CYCLOPHOSPHAMIDE  (sye kloe FOSS fa mide) treats some types of cancer. It works by slowing down the growth of cancer cells. This medicine may be used for other purposes; ask your health care provider or pharmacist if you have questions. COMMON BRAND NAME(S): Cyclophosphamide , Cytoxan , Neosar  What should I tell my care team before I take this medication? They need to know if you have any of these conditions: Heart disease Irregular heartbeat or rhythm Infection Kidney problems Liver disease Low blood  cell levels (white cells, platelets, or red blood cells) Lung disease Previous radiation Trouble passing urine An unusual or allergic reaction to cyclophosphamide , other medications, foods, dyes, or preservatives Pregnant or trying to get pregnant Breast-feeding How should I use this  medication? This medication is injected into a vein. It is given by your care team in a hospital or clinic setting. Talk to your care team about the use of this medication in children. Special care may be needed. Overdosage: If you think you have taken too much of this medicine contact a poison control center or emergency room at once. NOTE: This medicine is only for you. Do not share this medicine with others. What if I miss a dose? Keep appointments for follow-up doses. It is important not to miss your dose. Call your care team if you are unable to keep an appointment. What may interact with this medication? Amphotericin B Amiodarone Azathioprine Certain antivirals for HIV or hepatitis Certain medications for blood pressure, such as enalapril, lisinopril, quinapril Cyclosporine Diuretics Etanercept Indomethacin Medications that relax muscles Metronidazole Natalizumab Tamoxifen Warfarin This list may not describe all possible interactions. Give your health care provider a list of all the medicines, herbs, non-prescription drugs, or dietary supplements you use. Also tell them if you smoke, drink alcohol, or use illegal drugs. Some items may interact with your medicine. What should I watch for while using this medication? This medication may make you feel generally unwell. This is not uncommon as chemotherapy can affect healthy cells as well as cancer cells. Report any side effects. Continue your course of treatment even though you feel ill unless your care team tells you to stop. You may need blood work while you are taking this medication. This medication may increase your risk of getting an infection. Call your care team for advice if you get a fever, chills, sore throat, or other symptoms of a cold or flu. Do not treat yourself. Try to avoid being around people who are sick. Avoid taking medications that contain aspirin, acetaminophen , ibuprofen , naproxen , or ketoprofen unless  instructed by your care team. These medications may hide a fever. Be careful brushing or flossing your teeth or using a toothpick because you may get an infection or bleed more easily. If you have any dental work done, tell your dentist you are receiving this medication. Drink water  or other fluids as directed. Urinate often, even at night. Some products may contain alcohol. Ask your care team if this medication contains alcohol. Be sure to tell all care teams you are taking this medicine. Certain medicines, like metronidazole and disulfiram, can cause an unpleasant reaction when taken with alcohol. The reaction includes flushing, headache, nausea, vomiting, sweating, and increased thirst. The reaction can last from 30 minutes to several hours. Talk to your care team if you wish to become pregnant or think you might be pregnant. This medication can cause serious birth defects if taken during pregnancy and for 1 year after the last dose. A negative pregnancy test is required before starting this medication. A reliable form of contraception is recommended while taking this medication and for 1 year after the last dose. Talk to your care team about reliable forms of contraception. Do not father a child while taking this medication and for 4 months after the last dose. Use a condom during this time period. Do not breast-feed while taking this medication or for 1 week after the last dose. This medication may cause infertility. Talk to your care team  if you are concerned about your fertility. Talk to your care team about your risk of cancer. You may be more at risk for certain types of cancer if you take this medication. What side effects may I notice from receiving this medication? Side effects that you should report to your care team as soon as possible: Allergic reactions--skin rash, itching, hives, swelling of the face, lips, tongue, or throat Dry cough, shortness of breath or trouble breathing Heart  failure--shortness of breath, swelling of the ankles, feet, or hands, sudden weight gain, unusual weakness or fatigue Heart muscle inflammation--unusual weakness or fatigue, shortness of breath, chest pain, fast or irregular heartbeat, dizziness, swelling of the ankles, feet, or hands Heart rhythm changes--fast or irregular heartbeat, dizziness, feeling faint or lightheaded, chest pain, trouble breathing Infection--fever, chills, cough, sore throat, wounds that don't heal, pain or trouble when passing urine, general feeling of discomfort or being unwell Kidney injury--decrease in the amount of urine, swelling of the ankles, hands, or feet Liver injury--right upper belly pain, loss of appetite, nausea, light-colored stool, dark yellow or brown urine, yellowing skin or eyes, unusual weakness or fatigue Low red blood cell level--unusual weakness or fatigue, dizziness, headache, trouble breathing Low sodium level--muscle weakness, fatigue, dizziness, headache, confusion Red or dark brown urine Unusual bruising or bleeding Side effects that usually do not require medical attention (report to your care team if they continue or are bothersome): Hair loss Irregular menstrual cycles or spotting Loss of appetite Nausea Pain, redness, or swelling with sores inside the mouth or throat Vomiting This list may not describe all possible side effects. Call your doctor for medical advice about side effects. You may report side effects to FDA at 1-800-FDA-1088. Where should I keep my medication? This medication is given in a hospital or clinic. It will not be stored at home. NOTE: This sheet is a summary. It may not cover all possible information. If you have questions about this medicine, talk to your doctor, pharmacist, or health care provider.  2024 Elsevier/Gold Standard (2021-08-19 00:00:00)  Doxorubicin  Injection What is this medication? DOXORUBICIN  (dox oh ROO bi sin) treats some types of cancer. It  works by slowing down the growth of cancer cells. This medicine may be used for other purposes; ask your health care provider or pharmacist if you have questions. COMMON BRAND NAME(S): Adriamycin , Adriamycin  PFS, Adriamycin  RDF, Rubex  What should I tell my care team before I take this medication? They need to know if you have any of these conditions: Heart disease History of low blood cell levels caused by a medication Liver disease Recent or ongoing radiation An unusual or allergic reaction to doxorubicin , other medications, foods, dyes, or preservatives If you or your partner are pregnant or trying to get pregnant Breast-feeding How should I use this medication? This medication is injected into a vein. It is given by your care team in a hospital or clinic setting. Talk to your care team about the use of this medication in children. Special care may be needed. Overdosage: If you think you have taken too much of this medicine contact a poison control center or emergency room at once. NOTE: This medicine is only for you. Do not share this medicine with others. What if I miss a dose? Keep appointments for follow-up doses. It is important not to miss your dose. Call your care team if you are unable to keep an appointment. What may interact with this medication? 6-mercaptopurine Paclitaxel Phenytoin St. John's wort Trastuzumab Verapamil  This list may not describe all possible interactions. Give your health care provider a list of all the medicines, herbs, non-prescription drugs, or dietary supplements you use. Also tell them if you smoke, drink alcohol, or use illegal drugs. Some items may interact with your medicine. What should I watch for while using this medication? Your condition will be monitored carefully while you are receiving this medication. You may need blood work while taking this medication. This medication may make you feel generally unwell. This is not uncommon as  chemotherapy can affect healthy cells as well as cancer cells. Report any side effects. Continue your course of treatment even though you feel ill unless your care team tells you to stop. There is a maximum amount of this medication you should receive throughout your life. The amount depends on the medical condition being treated and your overall health. Your care team will watch how much of this medication you receive. Tell your care team if you have taken this medication before. Your urine may turn red for a few days after your dose. This is not blood. If your urine is dark or brown, call your care team. In some cases, you may be given additional medications to help with side effects. Follow all directions for their use. This medication may increase your risk of getting an infection. Call your care team for advice if you get a fever, chills, sore throat, or other symptoms of a cold or flu. Do not treat yourself. Try to avoid being around people who are sick. This medication may increase your risk to bruise or bleed. Call your care team if you notice any unusual bleeding. Talk to your care team about your risk of cancer. You may be more at risk for certain types of cancers if you take this medication. Talk to your care team if you or your partner may be pregnant. Serious birth defects can occur if you take this medication during pregnancy and for 6 months after the last dose. Contraception is recommended while taking this medication and for 6 months after the last dose. Your care team can help you find the option that works for you. If your partner can get pregnant, use a condom while taking this medication and for 6 months after the last dose. Do not breastfeed while taking this medication. This medication may cause infertility. Talk to your care team if you are concerned about your fertility. What side effects may I notice from receiving this medication? Side effects that you should report to your  care team as soon as possible: Allergic reactions--skin rash, itching, hives, swelling of the face, lips, tongue, or throat Heart failure--shortness of breath, swelling of the ankles, feet, or hands, sudden weight gain, unusual weakness or fatigue Heart rhythm changes--fast or irregular heartbeat, dizziness, feeling faint or lightheaded, chest pain, trouble breathing Infection--fever, chills, cough, sore throat, wounds that don't heal, pain or trouble when passing urine, general feeling of discomfort or being unwell Low red blood cell level--unusual weakness or fatigue, dizziness, headache, trouble breathing Painful swelling, warmth, or redness of the skin, blisters or sores at the infusion site Unusual bruising or bleeding Side effects that usually do not require medical attention (report to your care team if they continue or are bothersome): Diarrhea Hair loss Nausea Pain, redness, or swelling with sores inside the mouth or throat Red urine This list may not describe all possible side effects. Call your doctor for medical advice about side effects. You may report side effects  to FDA at 1-800-FDA-1088. Where should I keep my medication? This medication is given in a hospital or clinic. It will not be stored at home. NOTE: This sheet is a summary. It may not cover all possible information. If you have questions about this medicine, talk to your doctor, pharmacist, or health care provider.  2024 Elsevier/Gold Standard (2022-07-06 00:00:00)

## 2024-04-08 NOTE — Progress Notes (Signed)
 Verbal order with readback from Dr Lanny for Rt Breast US  and US  bx of RT breast d/t rt breast mass on MRI.  Orders placed.

## 2024-04-08 NOTE — Progress Notes (Signed)
 " [facility]   [phone] [phone]   Clinic Follow up Note   Patient Care Team: [facility], [facility] as PCP - General [name], [name], MD as PCP - Cardiology (Cardiology) Franchot [name], NP as Nurse Practitioner (Nurse Practitioner) Tyree [name], RN as Oncology Nurse Navigator Curvin [name], MD as Consulting Physician (General Surgery) [name], MD as Consulting Physician (Hematology) [name], MD as Consulting Physician (Radiation Oncology)  Date of Service:  04/08/2024  CHIEF COMPLAINT: f/u of left breast cancer   CURRENT THERAPY:  Neoadjuvant chemotherapy AC every 2 weeks  Oncology History   Malignant neoplasm of upper-outer quadrant of left breast in female, 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 No evidence of distant metastatic disease on recent CT and bone scan. Tumor biology suggests limited chemosensitivity, but neoadjuvant chemotherapy is indicated to assess response prior to surgery. She reports persistent pain and swelling in the left breast and axilla, intermittent nipple pain,  and skin changes. Laboratory studies show normal blood counts, renal, and hepatic function. Alternative regimens or earlier surgery may be considered if response is inadequate. - Initiated neoadjuvant chemotherapy with doxorubicin  and cyclophosphamide  (AC regimen) every two weeks for four cycles. - Administered growth factor support two days post-chemotherapy to reduce neutropenic infection risk. - Instructed to take loratadine for five days with growth factor to mitigate bone pain. - Provided antiemetics and topical cream for chemotherapy-related side effects. - Planned repeat breast MRI after completion of chemotherapy cycles to assess response. - Planned repeat ultrasound after cycle three to evaluate tumor response. - Advised to report significant side effects or inadequate nausea control. - Scheduled follow-up in two weeks prior to second chemotherapy cycle. - Assessed left breast and axilla for pain, swelling, and skin changes during visit.  Right breast mass, suspicious, pending biopsy A small, suspicious mass at 7 o'clock in the right breast was identified on MRI. Not visualized on prior mammogram or ultrasound; malignancy cannot be excluded. Further evaluation is required. - Ordered targeted right breast ultrasound to evaluate MRI-detected mass. - If visualized, proceed with ultrasound-guided biopsy; if not, plan MRI-guided biopsy. - Requested breast center to schedule ultrasound and possible biopsy of right breast and axilla.  Tricuspid valve regurgitation, mild to moderate Mild to moderate tricuspid regurgitation on echocardiogram. Cardiac function otherwise normal.  History of pulmonary embolism, on anticoagulation Maintained on apixaban  for anticoagulation. Anticoagulation continued during chemotherapy. - Continued apixaban  for anticoagulation.  Shoulder osteoarthritis, status post rotator cuff repair Left shoulder osteoarthritis with prior rotator cuff repair. Bone scan shows  uptake consistent with arthritis. She reports stiffness. - Continued current management for shoulder osteoarthritis. - Encouraged ongoing physical therapy as previously recommended.  Plan - I reviewed  her lab,MRI, CT, bone scan images, and echo results, findings with patient -will ultrasound of right breast and axilla, and US  guided biopsy if feasible - Will proceed to first cycle AC today, side effects and management is discussed with her - G-CSF in 2 days - Follow-up in 2 weeks before cycle 2 chemo   SUMMARY OF ONCOLOGIC HISTORY: Oncology History  Malignant neoplasm of upper-outer quadrant of left breast in female, estrogen receptor positive (HCC)  03/19/2024 Cancer Staging   Staging form: Breast, AJCC 8th Edition - Clinical stage from 03/19/2024: Stage IIA (cT2, cN1, cM0, G2, ER+, PR+, HER2-) - Signed by [name], MD on 03/26/2024 Stage prefix: Initial diagnosis Histologic grading system: 3 grade system   03/24/2024 Initial Diagnosis   Malignant neoplasm of upper-outer quadrant of left breast in female, estrogen receptor positive (HCC)   04/08/2024 -  Chemotherapy   Patient is on Treatment Plan : BREAST DOSE DENSE AC q14d / PACLitaxel q7d        Discussed the use of AI scribe software for clinical note transcription with the patient, who gave verbal consent to proceed.  History of Present Illness Monica Holland is a 38 year old female with ER-positive invasive ductal carcinoma of the left breast with metastatic left axillary lymphadenopathy who presents for follow-up and initiation of neoadjuvant chemotherapy.  She is being treated for ER-positive invasive ductal carcinoma of the left breast, upper-outer quadrant (T2N1), with biopsy-proven metastatic left axillary lymph nodes. Recent breast MRI showed interval increase in size of the left breast mass with persistent enhancement at the biopsy site and multiple enlarged left axillary nodes up to 1.8 cm. CT and bone scan showed no  distant metastatic disease.  She has persistent left nipple pain and intermittent erythema, edema, warmth, and raised skin of the left breast. She palpates a left breast mass about 5 cm and has throbbing pain in the left axilla at the prior lymph node biopsy site. She denies nipple discharge, new bone pain, or other symptoms concerning for distant metastasis.  MRI identified a new small mass at the 7 o'clock position in the right breast that was not seen on prior mammogram or ultrasound. She has no pain or palpable abnormality in the right breast or axilla.  She has mild to moderate tricuspid regurgitation on echocardiogram with preserved function and no heart failure symptoms. She takes Eliquis  for prior pulmonary embolism without new thromboembolic symptoms. She has chronic left shoulder stiffness and pain from arthritis and prior rotator cuff repair without acute change. She denies gastrointestinal, pulmonary, or constitutional symptoms.     All other systems were reviewed with the patient and are negative.  MEDICAL HISTORY:  Past Medical History:  Diagnosis Date   Abnormal uterine bleeding (AUB)    heavy vaginal bleeding   Anticoagulant long-term use    eliquis --- managed by dr timmy (hematologist)   Anxiety    Arthritis    Breast CA (HCC) 03/19/2024   left   Cancer (HCC)    Chronic nausea    per pt intermittant , without vomiting,  had normal EGD 12-29-2021 by dr federico   Chronic venous insufficiency of lower extremity    evaluted by dr debby robertson (vascular) 03-15-2021 note in epic, chronic venous insuff LLE causing edema/ post thombotic syndrome   Depression    Family history of breast cancer    GERD (gastroesophageal reflux disease)    History of pulmonary embolus (PE) 2014   nonocclusive and RLE DVT   History  of recurrent deep vein thrombosis (DVT)    LLE in 2004, 2009, 2016, 2021  & 2022 chronic DVT thrombis /  in 2014 , RLE and PE;   followed by dr timmy  (hematology)  negative work-up for blood disorder ,  secondary to  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   Peripheral vascular disease    Wears glasses     SURGICAL HISTORY: Past Surgical History:  Procedure Laterality Date   ANKLE SURGERY Right 2014   ARTHROSCOPY KNEE W/ DRILLING Left 10/2023   BREAST BIOPSY Left 03/19/2024   US  LT BREAST BX W LOC DEV EA ADD LESION IMG BX SPEC US  GUIDE 03/19/2024 [facility]   BREAST BIOPSY Left 03/19/2024   US  LT BREAST BX W LOC DEV 1ST LESION IMG BX SPEC US  GUIDE 03/19/2024 [facility]   BREAST BIOPSY Left 03/19/2024   US  LT BREAST BX W LOC DEV EA ADD LESION IMG BX SPEC US  GUIDE 03/19/2024 [facility]   DILITATION & CURRETTAGE/HYSTROSCOPY WITH NOVASURE ABLATION N/A 01/04/2022   Procedure: DILATATION & CURETTAGE/HYSTEROSCOPY WITH NOVASURE ABLATION;  Surgeon: Cleatus Moccasin, MD;  Location: [facility];  Service: Gynecology;  Laterality: N/A;   ESOPHAGOGASTRODUODENOSCOPY (EGD) WITH PROPOFOL   12/29/2021   by dr federico   INTRAUTERINE DEVICE (IUD) INSERTION N/A 01/04/2022   Procedure: INTRAUTERINE DEVICE (IUD) INSERTION;  Surgeon: Cleatus Moccasin, MD;  Location: [facility];  Service: Gynecology;  Laterality: N/A;   PORTACATH PLACEMENT N/A 03/31/2024   Procedure: INSERTION, TUNNELED CENTRAL VENOUS DEVICE, WITH PORT;  Surgeon: Curvin [name], MD;  Location: [facility];  Service: General;  Laterality: N/A;  PORT PLACEMENT WITH ULTRASOUND GUIDANCE   ROTATOR CUFF REPAIR Left 12/2023   SHOULDER ARTHROSCOPY Right 2024    I have reviewed the social history and family history with the patient and they are unchanged from previous note.  ALLERGIES:  is allergic to nsaids.  MEDICATIONS:  Current Outpatient Medications  Medication Sig Dispense Refill   albuterol  (VENTOLIN  HFA) 108 (90 Base) MCG/ACT inhaler Inhale 1-2 puffs into the lungs every 6 (six) hours as needed for wheezing or shortness of breath. 8  g 0   apixaban  (ELIQUIS ) 5 MG TABS tablet Take 1 tablet (5 mg total) by mouth 2 (two) times daily. 60 tablet 3   dicyclomine  (BENTYL ) 20 MG tablet Take 1 tablet (20 mg total) by mouth 2 (two) times daily as needed. 20 tablet 0   fluticasone  (FLONASE ) 50 MCG/ACT nasal spray Place 2 sprays into both nostrils daily. 9.9 mL 2   lidocaine  (LIDODERM ) 5 % Place 1 patch onto the skin daily. Remove & Discard patch within 12 hours or as directed by MD 30 patch 0   lidocaine  (XYLOCAINE ) 5 % ointment Apply 1 Application topically as needed. 35.44 g 0   oxyCODONE  (ROXICODONE ) 5 MG immediate release tablet Take 1 tablet (5 mg total) by mouth every 6 (six) hours as needed. 5 tablet 0   pantoprazole  (PROTONIX ) 40 MG tablet Take 1 tablet (40 mg total) by mouth 2 (two) times daily. 60 tablet 1   prochlorperazine  (COMPAZINE ) 10 MG tablet Take 1 tablet (10 mg total) by mouth every 6 (six) hours as needed for nausea or vomiting. 30 tablet 1   tiZANidine  (ZANAFLEX ) 2 MG tablet Take 1 tablet (2 mg total) by mouth every 8 (eight) hours as needed for muscle spasms. 30 tablet 0   dexamethasone  (DECADRON ) 4 MG tablet Take 2 tablets (8 mg total) by  mouth daily for 3 days. Start the day after doxorubicin /cyclophosphamide  chemotherapy. Take with food. (Patient not taking: Reported on 04/08/2024) 30 tablet 1   lidocaine -prilocaine  (EMLA ) cream Apply to affected area once (Patient not taking: Reported on 04/08/2024) 30 g 3   ondansetron  (ZOFRAN ) 8 MG tablet Take 1 tab (8 mg) by mouth every 8 hrs as needed for nausea/vomiting. Start third day after doxorubicin /cyclophosphamide  chemotherapy. (Patient not taking: Reported on 04/08/2024) 30 tablet 1   No current facility-administered medications for this visit.    PHYSICAL EXAMINATION: ECOG PERFORMANCE STATUS: 0 - Asymptomatic  Vitals:   04/08/24 0822  BP: 119/82  Pulse: 80  Resp: 17  Temp: (!) 97.5 F (36.4 C)  SpO2: 100%   Wt Readings from Last 3 Encounters:  04/08/24  187 lb 6.4 oz (85 kg)  03/31/24 177 lb 14.6 oz (80.7 kg)  03/28/24 178 lb (80.7 kg)     GENERAL:alert, no distress and comfortable SKIN: skin color, texture, turgor are normal, no rashes or significant lesions EYES: normal, Conjunctiva are pink and non-injected, sclera clear NECK: supple, thyroid normal size, non-tender, without nodularity LYMPH:  no palpable lymphadenopathy in the cervical, axillary  LUNGS: clear to auscultation and percussion with normal breathing effort HEART: regular rate & rhythm and no murmurs and no lower extremity edema ABDOMEN:abdomen soft, non-tender and normal bowel sounds Musculoskeletal:no cyanosis of digits and no clubbing  NEURO: alert & oriented x 3 with fluent speech, no focal motor/sensory deficits BREAST: Left breast swollen, warm to touch. Tumor approximately 5x5 cm in UOQ OF left breast. R breast and axillas exam unremarkable Physical Exam    LABORATORY DATA:  I have reviewed the data as listed    Latest Ref Rng & Units 04/08/2024    7:54 AM 03/26/2024    1:01 PM 03/17/2024    1:53 PM  CBC  WBC 4.0 - 10.5 K/uL 4.7  4.2    Hemoglobin 12.0 - 15.0 g/dL 86.1  86.2  83.9   Hematocrit 36.0 - 46.0 % 41.1  40.7  47.0   Platelets 150 - 400 K/uL 172  232          Latest Ref Rng & Units 04/08/2024    7:54 AM 03/26/2024    1:01 PM 03/17/2024    1:53 PM  CMP  Glucose 70 - 99 mg/dL 899  93  898   BUN 6 - 20 mg/dL 13  9  8    Creatinine 0.44 - 1.00 mg/dL 9.27  9.14  8.99   Sodium 135 - 145 mmol/L 140  141  140   Potassium 3.5 - 5.1 mmol/L 4.0  4.2  4.3   Chloride 98 - 111 mmol/L 106  106  100   CO2 22 - 32 mmol/L 26  30    Calcium 8.9 - 10.3 mg/dL 9.2  9.6    Total Protein 6.5 - 8.1 g/dL 7.1  7.2    Total Bilirubin 0.0 - 1.2 mg/dL 0.4  0.8    Alkaline Phos 38 - 126 U/L 67  64    AST 15 - 41 U/L 18  20    ALT 0 - 44 U/L 14  16        RADIOGRAPHIC STUDIES: I have personally reviewed the radiological images as listed and agreed with the  findings in the report. No results found.    Orders Placed This Encounter  Procedures   US  LIMITED ULTRASOUND INCLUDING AXILLA RIGHT BREAST    INS: MED PAY  PREV: SN:03/19/2024 @ [facility] DIAG:04/04/2024 @ [facility]  SEND FOR COSIGN 04/08/2024 AJ SW PT  PT AWARE $75 NO SHOW/CANCELLATION FEE WITHIN 24 HOURS    Standing Status:   Future    Expected Date:   04/22/2024    Expiration Date:   04/08/2025    Reason for Exam (SYMPTOM  OR DIAGNOSIS REQUIRED):   There is an oval, circumscribed enhancing mass in the right breast measuring 9 mm in long axis    Preferred imaging location?:   [facility]    Call Results- Best Contact Number?:   [phone]   US  RT BREAST BX W LOC DEV 1ST LESION IMG BX SPEC US  GUIDE    INS: MED PAY  PREV: SN:03/19/2024 @ [facility] DIAG:04/04/2024 @ [facility]  SEND FOR COSIGN 04/08/2024 AJ SW PT  PT AWARE $75 NO SHOW/CANCELLATION FEE WITHIN 24 HOURS    Standing Status:   Future    Expected Date:   04/22/2024    Expiration Date:   04/08/2025    Reason for Exam (SYMPTOM  OR DIAGNOSIS REQUIRED):   There is an oval, circumscribed enhancing mass in the right breast measuring 9 mm in long axis    Preferred imaging location?:   [facility]    Call Results- Best Contact Number?:   [phone]   All questions were answered. The patient knows to call the clinic with any problems, questions or concerns. No barriers to learning was detected. The total time spent in the appointment was 40 minutes, including review of chart and various tests results, discussions about plan of care and coordination of care plan     Onita Mattock, MD 04/08/2024      "

## 2024-04-08 NOTE — Research (Signed)
 [phone]: Complementary Options for Symptom Management In Cancer (COSMIC) Assessing Benefits and Harms of Cannabis and Cannabinoid Use Among a Cohort of Cancer Patients Treated in [facility] Oncology Clinics    Patient Monica Holland was identified by Dr. Lanny as a potential candidate for the above listed study.  This Clinical Research Coordinator met with COPPER KIRTLEY, [id number], on 04/08/2024 in a manner and location that ensures patient privacy to discuss participation in the above listed research study.  Patient is Accompanied by sister.  A copy of the informed consent document and separate HIPAA Authorization was provided to the patient.  Patient reads, speaks, and understands English.    Patient was provided with the business card of this Coordinator and encouraged to contact the research team with any questions.  Patient was provided the option of taking informed consent documents home to review and was encouraged to review at their convenience with their support network, including other care providers. Patient is comfortable with making a decision regarding study participation today.  As outlined in the informed consent form, this Coordinator and Dailin J Lorenson discussed the purpose of the research study, the investigational nature of the study, study procedures and requirements for study participation, potential risks and benefits of study participation, as well as alternatives to participation. This study is not blinded. The patient understands participation is voluntary and they may withdraw from study participation at any time.  This study does not involve randomization.  This study does not involve an investigational drug or device. This study does not involve a placebo. Patient understands enrollment is pending full eligibility review.   Confidentiality and how the patient's information will be used as part of study participation were discussed.  Patient was informed there is  reimbursement provided for their time and effort spent on trial participation.  The patient is encouraged to discuss research study participation with their insurance provider to determine what costs they may incur as part of study participation, including research related injury.    All questions were answered to patient's satisfaction.  The informed consent and separate HIPAA Authorization was reviewed page by page.  The patient's mental and emotional status is appropriate to provide informed consent, and the patient verbalizes an understanding of study participation.  Patient has agreed to participate in the above listed research study and has voluntarily signed the informed consent version 11/16/23  with embedded HIPAA language, version 01/01/24 on 04/08/2024 at 8:50 AM.  The patient was provided with a copy of the signed informed consent form and separate HIPAA Authorization for their reference.  No study specific procedures were obtained prior to the signing of the informed consent document.  Approximately 15 minutes were spent with the patient reviewing the informed consent documents.  Patient was not requested to complete a Release of Information form.  Eligibility: Eligibility criteria reviewed with patient. This nurse/coordinator has reviewed this patient's inclusion and exclusion criteria and confirmed patient is eligible for study participation. Eligibility confirmed by treating investigator, Dr. Lanny , who also agrees that patient should proceed with enrollment.  Patient aware she will receive study screening survey via email to confirm eligibility.    Laury Quale, MPH  Clinical Research Coordinator

## 2024-04-08 NOTE — Assessment & Plan Note (Signed)
 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  -Due to her young age and node positive disease, I recommend neoadjuvant chemotherapy AC-T

## 2024-04-09 ENCOUNTER — Other Ambulatory Visit: Payer: Self-pay

## 2024-04-09 ENCOUNTER — Telehealth: Payer: Self-pay

## 2024-04-09 NOTE — Telephone Encounter (Signed)
-----   Message from Nurse Oza RAMAN, RN sent at 04/08/2024 11:54 AM EST ----- Regarding: Dr. Lanny; First Time F/U Call Patient received first time A/C on 04/08/24. Tolerated well other than sinus burning/headache during Cytoxan  infusion. Returning 04/11/24 for injection. Thank you!

## 2024-04-11 ENCOUNTER — Inpatient Hospital Stay

## 2024-04-11 VITALS — BP 139/99 | HR 79 | Resp 17

## 2024-04-11 DIAGNOSIS — Z5111 Encounter for antineoplastic chemotherapy: Secondary | ICD-10-CM | POA: Diagnosis not present

## 2024-04-11 DIAGNOSIS — C50412 Malignant neoplasm of upper-outer quadrant of left female breast: Secondary | ICD-10-CM

## 2024-04-11 MED ORDER — PEGFILGRASTIM-CBQV 6 MG/0.6ML ~~LOC~~ SOSY
6.0000 mg | PREFILLED_SYRINGE | Freq: Once | SUBCUTANEOUS | Status: AC
  Administered 2024-04-11: 6 mg via SUBCUTANEOUS
  Filled 2024-04-11: qty 0.6

## 2024-04-12 ENCOUNTER — Other Ambulatory Visit (HOSPITAL_COMMUNITY): Payer: Self-pay

## 2024-04-14 ENCOUNTER — Ambulatory Visit
Admission: RE | Admit: 2024-04-14 | Discharge: 2024-04-14 | Disposition: A | Discharge status: Home / Self Care | Source: Ambulatory Visit | Attending: Hematology | Admitting: Hematology

## 2024-04-14 ENCOUNTER — Ambulatory Visit
Admission: RE | Admit: 2024-04-14 | Discharge: 2024-04-14 | Disposition: A | Source: Ambulatory Visit | Attending: Hematology

## 2024-04-14 ENCOUNTER — Ambulatory Visit
Admission: RE | Admit: 2024-04-14 | Discharge: 2024-04-14 | Disposition: A | Source: Ambulatory Visit | Attending: Hematology | Admitting: Hematology

## 2024-04-14 ENCOUNTER — Other Ambulatory Visit: Payer: Self-pay | Admitting: Hematology

## 2024-04-14 DIAGNOSIS — Z170 Estrogen receptor positive status [ER+]: Secondary | ICD-10-CM

## 2024-04-14 DIAGNOSIS — N6310 Unspecified lump in the right breast, unspecified quadrant: Secondary | ICD-10-CM

## 2024-04-14 HISTORY — PX: BREAST BIOPSY: SHX20

## 2024-04-15 ENCOUNTER — Ambulatory Visit: Payer: Self-pay | Admitting: Genetic Counselor

## 2024-04-15 ENCOUNTER — Telehealth: Payer: Self-pay | Admitting: Genetic Counselor

## 2024-04-15 ENCOUNTER — Other Ambulatory Visit: Payer: Self-pay

## 2024-04-15 ENCOUNTER — Encounter: Payer: Self-pay | Admitting: Genetic Counselor

## 2024-04-15 ENCOUNTER — Inpatient Hospital Stay (HOSPITAL_BASED_OUTPATIENT_CLINIC_OR_DEPARTMENT_OTHER)
Admission: EM | Admit: 2024-04-15 | Discharge: 2024-04-18 | DRG: 301 | Disposition: A | Discharge status: Home / Self Care | Attending: Internal Medicine | Admitting: Internal Medicine

## 2024-04-15 ENCOUNTER — Emergency Department (HOSPITAL_BASED_OUTPATIENT_CLINIC_OR_DEPARTMENT_OTHER)

## 2024-04-15 DIAGNOSIS — M79671 Pain in right foot: Secondary | ICD-10-CM | POA: Diagnosis not present

## 2024-04-15 DIAGNOSIS — Z8249 Family history of ischemic heart disease and other diseases of the circulatory system: Secondary | ICD-10-CM

## 2024-04-15 DIAGNOSIS — R221 Localized swelling, mass and lump, neck: Secondary | ICD-10-CM | POA: Diagnosis not present

## 2024-04-15 DIAGNOSIS — Z86718 Personal history of other venous thrombosis and embolism: Secondary | ICD-10-CM

## 2024-04-15 DIAGNOSIS — Z86711 Personal history of pulmonary embolism: Secondary | ICD-10-CM | POA: Diagnosis not present

## 2024-04-15 DIAGNOSIS — F17210 Nicotine dependence, cigarettes, uncomplicated: Secondary | ICD-10-CM | POA: Diagnosis present

## 2024-04-15 DIAGNOSIS — Z9221 Personal history of antineoplastic chemotherapy: Secondary | ICD-10-CM

## 2024-04-15 DIAGNOSIS — T451X5A Adverse effect of antineoplastic and immunosuppressive drugs, initial encounter: Secondary | ICD-10-CM | POA: Diagnosis present

## 2024-04-15 DIAGNOSIS — Z833 Family history of diabetes mellitus: Secondary | ICD-10-CM

## 2024-04-15 DIAGNOSIS — M542 Cervicalgia: Secondary | ICD-10-CM | POA: Diagnosis present

## 2024-04-15 DIAGNOSIS — Z79899 Other long term (current) drug therapy: Secondary | ICD-10-CM | POA: Diagnosis not present

## 2024-04-15 DIAGNOSIS — Z888 Allergy status to other drugs, medicaments and biological substances status: Secondary | ICD-10-CM | POA: Diagnosis not present

## 2024-04-15 DIAGNOSIS — Z886 Allergy status to analgesic agent status: Secondary | ICD-10-CM | POA: Diagnosis not present

## 2024-04-15 DIAGNOSIS — Z1379 Encounter for other screening for genetic and chromosomal anomalies: Secondary | ICD-10-CM

## 2024-04-15 DIAGNOSIS — Z7901 Long term (current) use of anticoagulants: Secondary | ICD-10-CM | POA: Diagnosis not present

## 2024-04-15 DIAGNOSIS — Z1721 Progesterone receptor positive status: Secondary | ICD-10-CM

## 2024-04-15 DIAGNOSIS — Z853 Personal history of malignant neoplasm of breast: Secondary | ICD-10-CM | POA: Diagnosis not present

## 2024-04-15 DIAGNOSIS — C50912 Malignant neoplasm of unspecified site of left female breast: Secondary | ICD-10-CM | POA: Diagnosis present

## 2024-04-15 DIAGNOSIS — Z803 Family history of malignant neoplasm of breast: Secondary | ICD-10-CM | POA: Diagnosis not present

## 2024-04-15 DIAGNOSIS — D696 Thrombocytopenia, unspecified: Secondary | ICD-10-CM | POA: Diagnosis present

## 2024-04-15 DIAGNOSIS — Y838 Other surgical procedures as the cause of abnormal reaction of the patient, or of later complication, without mention of misadventure at the time of the procedure: Secondary | ICD-10-CM | POA: Diagnosis present

## 2024-04-15 DIAGNOSIS — I82C11 Acute embolism and thrombosis of right internal jugular vein: Secondary | ICD-10-CM | POA: Diagnosis present

## 2024-04-15 DIAGNOSIS — D72819 Decreased white blood cell count, unspecified: Secondary | ICD-10-CM | POA: Diagnosis not present

## 2024-04-15 DIAGNOSIS — T8172XA Complication of vein following a procedure, not elsewhere classified, initial encounter: Secondary | ICD-10-CM | POA: Diagnosis present

## 2024-04-15 DIAGNOSIS — T82594A Other mechanical complication of infusion catheter, initial encounter: Principal | ICD-10-CM

## 2024-04-15 DIAGNOSIS — M79672 Pain in left foot: Secondary | ICD-10-CM | POA: Diagnosis not present

## 2024-04-15 LAB — COMPREHENSIVE METABOLIC PANEL WITH GFR
ALT: 18 U/L (ref 0–44)
AST: 15 U/L (ref 15–41)
Albumin: 4 g/dL (ref 3.5–5.0)
Alkaline Phosphatase: 84 U/L (ref 38–126)
Anion gap: 10 (ref 5–15)
BUN: 8 mg/dL (ref 6–20)
CO2: 25 mmol/L (ref 22–32)
Calcium: 9.2 mg/dL (ref 8.9–10.3)
Chloride: 103 mmol/L (ref 98–111)
Creatinine, Ser: 0.75 mg/dL (ref 0.44–1.00)
GFR, Estimated: >60 mL/min
Glucose, Bld: 99 mg/dL (ref 70–99)
Potassium: 3.9 mmol/L (ref 3.5–5.1)
Sodium: 138 mmol/L (ref 135–145)
Total Bilirubin: 0.4 mg/dL (ref 0.0–1.2)
Total Protein: 6.3 g/dL — ABNORMAL LOW (ref 6.5–8.1)

## 2024-04-15 LAB — CBC WITH DIFFERENTIAL/PLATELET
Abs Immature Granulocytes: 0.07 K/uL (ref 0.00–0.07)
Basophils Absolute: 0 K/uL (ref 0.0–0.1)
Basophils Relative: 1 %
Eosinophils Absolute: 0.2 K/uL (ref 0.0–0.5)
Eosinophils Relative: 3 %
HCT: 36.7 % (ref 36.0–46.0)
Hemoglobin: 12.5 g/dL (ref 12.0–15.0)
Immature Granulocytes: 1 %
Lymphocytes Relative: 25 %
Lymphs Abs: 1.3 K/uL (ref 0.7–4.0)
MCH: 28.5 pg (ref 26.0–34.0)
MCHC: 34.1 g/dL (ref 30.0–36.0)
MCV: 83.8 fL (ref 80.0–100.0)
Monocytes Absolute: 0.1 K/uL (ref 0.1–1.0)
Monocytes Relative: 1 %
Neutro Abs: 3.6 K/uL (ref 1.7–7.7)
Neutrophils Relative %: 69 %
Platelets: 87 K/uL — ABNORMAL LOW (ref 150–400)
RBC: 4.38 MIL/uL (ref 3.87–5.11)
RDW: 14.3 % (ref 11.5–15.5)
Smear Review: NORMAL
WBC: 5.2 K/uL (ref 4.0–10.5)
nRBC: 0 % (ref 0.0–0.2)

## 2024-04-15 LAB — CBC
HCT: 36.2 % (ref 36.0–46.0)
Hemoglobin: 12.2 g/dL (ref 12.0–15.0)
MCH: 27.9 pg (ref 26.0–34.0)
MCHC: 33.7 g/dL (ref 30.0–36.0)
MCV: 82.8 fL (ref 80.0–100.0)
Platelets: 78 K/uL — ABNORMAL LOW (ref 150–400)
RBC: 4.37 MIL/uL (ref 3.87–5.11)
RDW: 14.2 % (ref 11.5–15.5)
WBC: 4.4 K/uL (ref 4.0–10.5)
nRBC: 0 % (ref 0.0–0.2)

## 2024-04-15 LAB — HCG, SERUM, QUALITATIVE: Preg, Serum: NEGATIVE

## 2024-04-15 LAB — HEPARIN LEVEL (UNFRACTIONATED)
Heparin Unfractionated: 0.39 [IU]/mL (ref 0.30–0.70)
Heparin Unfractionated: 0.23 [IU]/mL — ABNORMAL LOW (ref 0.30–0.70)

## 2024-04-15 LAB — APTT: aPTT: 95 s — ABNORMAL HIGH (ref 24–36)

## 2024-04-15 LAB — SURGICAL PATHOLOGY

## 2024-04-15 LAB — PREGNANCY, URINE: Preg Test, Ur: NEGATIVE

## 2024-04-15 LAB — LACTIC ACID, PLASMA: Lactic Acid, Venous: 0.9 mmol/L (ref 0.5–1.9)

## 2024-04-15 MED ORDER — PIPERACILLIN-TAZOBACTAM 3.375 G IVPB
3.3750 g | Freq: Three times a day (TID) | INTRAVENOUS | Status: DC
  Administered 2024-04-15 – 2024-04-18 (×10): 3.375 g via INTRAVENOUS
  Filled 2024-04-15 (×9): qty 50

## 2024-04-15 MED ORDER — VANCOMYCIN HCL IN DEXTROSE 1-5 GM/200ML-% IV SOLN
1000.0000 mg | INTRAVENOUS | Status: AC
  Administered 2024-04-15 (×2): 1000 mg via INTRAVENOUS
  Filled 2024-04-15 (×2): qty 200

## 2024-04-15 MED ORDER — HEPARIN (PORCINE) 25000 UT/250ML-% IV SOLN
1600.0000 [IU]/h | INTRAVENOUS | Status: AC
  Administered 2024-04-15 (×2): 1350 [IU]/h via INTRAVENOUS
  Administered 2024-04-16 – 2024-04-17 (×2): 1600 [IU]/h via INTRAVENOUS
  Filled 2024-04-15 (×4): qty 250

## 2024-04-15 MED ORDER — DICYCLOMINE HCL 10 MG PO CAPS: 20.0000 mg | ORAL_CAPSULE | Freq: Two times a day (BID) | ORAL | Status: DC | PRN

## 2024-04-15 MED ORDER — HYDROMORPHONE HCL 1 MG/ML IJ SOLN
1.0000 mg | Freq: Once | INTRAMUSCULAR | Status: AC
  Administered 2024-04-15: 1 mg via INTRAVENOUS
  Filled 2024-04-15: qty 1

## 2024-04-15 MED ORDER — ACETAMINOPHEN 325 MG PO TABS: 650.0000 mg | ORAL_TABLET | Freq: Four times a day (QID) | ORAL | Status: DC | PRN

## 2024-04-15 MED ORDER — HYDROMORPHONE HCL 1 MG/ML IJ SOLN
0.5000 mg | INTRAMUSCULAR | Status: DC | PRN
  Administered 2024-04-15 – 2024-04-18 (×6): 1 mg via INTRAVENOUS
  Filled 2024-04-15 (×6): qty 1

## 2024-04-15 MED ORDER — ONDANSETRON HCL 4 MG/2ML IJ SOLN
4.0000 mg | Freq: Four times a day (QID) | INTRAMUSCULAR | Status: DC | PRN
  Administered 2024-04-15 – 2024-04-18 (×5): 4 mg via INTRAVENOUS
  Filled 2024-04-15 (×5): qty 2

## 2024-04-15 MED ORDER — TIZANIDINE HCL 4 MG PO TABS
2.0000 mg | ORAL_TABLET | Freq: Three times a day (TID) | ORAL | Status: DC | PRN
  Filled 2024-04-15: qty 1

## 2024-04-15 MED ORDER — FAMOTIDINE 20 MG PO TABS: 20.0000 mg | ORAL_TABLET | Freq: Two times a day (BID) | ORAL | Status: DC

## 2024-04-15 MED ORDER — ONDANSETRON HCL 4 MG/2ML IJ SOLN
4.0000 mg | Freq: Three times a day (TID) | INTRAMUSCULAR | Status: DC | PRN
  Administered 2024-04-15: 4 mg via INTRAVENOUS
  Filled 2024-04-15: qty 2

## 2024-04-15 MED ORDER — ALBUTEROL SULFATE HFA 108 (90 BASE) MCG/ACT IN AERS
1.0000 | INHALATION_SPRAY | Freq: Four times a day (QID) | RESPIRATORY_TRACT | Status: DC | PRN
  Administered 2024-04-15: 2 via RESPIRATORY_TRACT
  Filled 2024-04-15: qty 6.7

## 2024-04-15 MED ORDER — LORATADINE 10 MG PO TABS
10.0000 mg | ORAL_TABLET | Freq: Once | ORAL | Status: AC
  Administered 2024-04-15: 10 mg via ORAL
  Filled 2024-04-15: qty 1

## 2024-04-15 MED ORDER — PANTOPRAZOLE SODIUM 40 MG PO TBEC
40.0000 mg | DELAYED_RELEASE_TABLET | Freq: Two times a day (BID) | ORAL | Status: DC
  Administered 2024-04-15 – 2024-04-18 (×7): 40 mg via ORAL
  Filled 2024-04-15 (×7): qty 1

## 2024-04-15 MED ORDER — IOHEXOL 350 MG/ML SOLN
100.0000 mL | Freq: Once | INTRAVENOUS | Status: AC | PRN
  Administered 2024-04-15: 100 mL via INTRAVENOUS

## 2024-04-15 MED ORDER — OXYCODONE HCL 5 MG PO TABS
5.0000 mg | ORAL_TABLET | Freq: Four times a day (QID) | ORAL | Status: DC | PRN
  Administered 2024-04-17 – 2024-04-18 (×3): 5 mg via ORAL
  Filled 2024-04-15 (×3): qty 1

## 2024-04-15 MED ORDER — HEPARIN BOLUS VIA INFUSION
2000.0000 [IU] | Freq: Once | INTRAVENOUS | Status: AC
  Administered 2024-04-15: 2000 [IU] via INTRAVENOUS
  Filled 2024-04-15: qty 2000

## 2024-04-15 MED ORDER — SODIUM CHLORIDE 0.9 % IV SOLN
1.0000 g | Freq: Once | INTRAVENOUS | Status: DC
  Filled 2024-04-15: qty 10

## 2024-04-15 MED ORDER — HEPARIN BOLUS VIA INFUSION
4000.0000 [IU] | Freq: Once | INTRAVENOUS | Status: AC
  Administered 2024-04-15: 4000 [IU] via INTRAVENOUS

## 2024-04-15 MED ORDER — OXYCODONE HCL 5 MG PO TABS
5.0000 mg | ORAL_TABLET | Freq: Four times a day (QID) | ORAL | Status: DC | PRN
  Administered 2024-04-15: 5 mg via ORAL
  Filled 2024-04-15: qty 1

## 2024-04-15 NOTE — Progress Notes (Signed)
 ED Pharmacy Antibiotic Sign Off An antibiotic consult was received from an ED provider for Vancomycin   per pharmacy dosing for thrombophlebitis. A chart review was completed to assess appropriateness.   The following one time order(s) were placed:  Vancomycin  2000 mg IV   Further antibiotic and/or antibiotic pharmacy consults should be ordered by the admitting provider if indicated.   Dail Cordella Misty, [facility]  Clinical Pharmacist 04/15/2024 5:37 AM

## 2024-04-15 NOTE — H&P (Signed)
 "  ADMISSION HISTORY AND PHYSICAL   Monica Holland FMW:[id number] DOB: 1985-10-21 DOA: 04/15/2024  [facility]P: [facility] Patient coming from: home via [facility]   Chief Complaint: pain in feet - swelling in neck   HPI:  38 year old with a history of PE and DVT on Eliquis , tricuspid valve regurgitation, and breast cancer status post port placement 03/31/2024 (per Dr. Curvin) with recent initiation of chemotherapy approximately 1 week prior who [facility] with the acute onset of severe pain around her port associated with swelling of the neck with tenderness to palpation over the right neck.  On exam she was found to have redness and swelling at the right IJ port.  CTa revealed no evidence of PE but did confirm a right IJ chest port with intraluminal thrombus above the level of the catheter extending approximately 4.7 cm.  This finding was discussed with general surgery who recommended vascular surgery consultation.  Vascular surgery was called and they recommended IR consultation.  IR was called and they recommended IV heparin , empiric antibiotics, and admission for observation with possible need to replace the port if it is no longer functional.  ROS was also positive for severe burning type pain on the bottom of both feet with no recent history of injury or trauma. This pain essentially resolved after a dose of IV dilaudid .   Assessment/Plan  R internal jugular port-a-cath with peri-catheter thrombus - catheter associated venous thrombosis  Cont IV heparin  - IR consultation once arrives on a hospital campus - empiric abx as suggested by IR  B LE neuropathic pain Trial of Neurontin - suspect related to chemotherapy - exam without remarkable findings   Breast CA on active chemotherapy No active issues at present   20 year Hx of PE and prior DVTs on Eliquis   Followed by Dr. Timmy as outpt - Covered by heparin  gtt for now - patient reports religious  compliance with Eliquis  - will need to consider changing to alternative anticoagulant once able to transition off IV heparin  given clot formation even while on Eliquis     DVT prophylaxis: IV heparin   Code Status:   Code Status: Full Code Family Communication: no family present at time of exam  Disposition Plan:  Admit to Inpatient   Review of Systems: As per HPI otherwise 10 point review of systems negative.   Past Medical History:  Diagnosis Date   Abnormal uterine bleeding (AUB)    heavy vaginal bleeding   Anticoagulant long-term use    eliquis --- managed by dr timmy (hematologist)   Anxiety    Arthritis    Breast CA (HCC) 03/19/2024   left   Cancer (HCC)    Chronic nausea    per pt intermittant , without vomiting,  had normal EGD 12-29-2021 by dr federico   Chronic venous insufficiency of lower extremity    evaluted by dr debby robertson (vascular) 03-15-2021 note in epic, chronic venous insuff LLE causing edema/ post thombotic syndrome   Depression    Family history of breast cancer    GERD (gastroesophageal reflux disease)    History of pulmonary embolus (PE) 2014   nonocclusive and RLE DVT   History of recurrent deep vein thrombosis (DVT)    LLE in 2004, 2009, 2016, 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 of LLE  post thrombotic syndrome   Peripheral vascular disease    Wears glasses     Past Surgical History:  Procedure Laterality Date   ANKLE SURGERY Right 2014   ARTHROSCOPY KNEE W/ DRILLING Left 10/2023   BREAST BIOPSY Left 03/19/2024   US  LT BREAST BX W LOC DEV EA ADD LESION IMG BX SPEC US  GUIDE 03/19/2024 [facility]   BREAST BIOPSY Left 03/19/2024   US  LT BREAST BX W LOC DEV 1ST LESION IMG BX SPEC US  GUIDE 03/19/2024 [facility]   BREAST BIOPSY Left 03/19/2024   US  LT BREAST BX W LOC DEV EA ADD LESION IMG BX SPEC US   GUIDE 03/19/2024 [facility]   BREAST BIOPSY Right 04/14/2024   US  RT BREAST BX W LOC DEV 1ST LESION IMG BX SPEC US  GUIDE 04/14/2024 [facility]   DILITATION & CURRETTAGE/HYSTROSCOPY WITH NOVASURE ABLATION N/A 01/04/2022   Procedure: DILATATION & CURETTAGE/HYSTEROSCOPY WITH NOVASURE ABLATION;  Surgeon: Cleatus Moccasin, MD;  Location: [facility] [place];  Service: Gynecology;  Laterality: N/A;   ESOPHAGOGASTRODUODENOSCOPY (EGD) WITH PROPOFOL   12/29/2021   by dr federico   INTRAUTERINE DEVICE (IUD) INSERTION N/A 01/04/2022   Procedure: INTRAUTERINE DEVICE (IUD) INSERTION;  Surgeon: Cleatus Moccasin, MD;  Location: [facility];  Service: Gynecology;  Laterality: N/A;   PORTACATH PLACEMENT N/A 03/31/2024   Procedure: INSERTION, TUNNELED CENTRAL VENOUS DEVICE, WITH PORT;  Surgeon: Curvin [name], MD;  Location: [facility];  Service: General;  Laterality: N/A;  PORT PLACEMENT WITH ULTRASOUND GUIDANCE   ROTATOR CUFF REPAIR Left 12/2023   SHOULDER ARTHROSCOPY Right 2024    Family History  Family History  Problem Relation Age of Onset   Deep vein thrombosis Mother    Hyperlipidemia Mother    Diabetes Mother    High Cholesterol Mother    Heart attack Father    Breast cancer Paternal Aunt    Colon cancer Paternal Grandmother    Pancreatitis Half-Brother        paternal half brother   Down syndrome Half-Sister        maternal half sister   Learning disabilities Half-Sister        maternal half sister   Stomach cancer Neg Hx    Esophageal cancer Neg Hx    Rectal cancer Neg Hx     Social History   reports that she has been smoking cigarettes. She started smoking about 32 years ago. She has a 48 pack-year smoking history. She has never used smokeless tobacco. She reports current alcohol use of about 3.0 standard drinks of alcohol per week. She reports current drug use. Drug: Marijuana.  Allergies Allergies[1]  Prior to Admission medications  Medication Sig Start  Date End Date Taking? Authorizing Provider  albuterol  (VENTOLIN  HFA) 108 (90 Base) MCG/ACT inhaler Inhale 1-2 puffs into the lungs every 6 (six) hours as needed for wheezing or shortness of breath. 02/08/22  Yes Lynwood Lenis, PA-C  apixaban  (ELIQUIS ) 5 MG TABS tablet Take 1 tablet (5 mg total) by mouth 2 (two) times daily. 06/12/23  Yes Franchot [name], NP  dicyclomine  (BENTYL ) 20 MG tablet Take 1 tablet (20 mg total) by mouth 2 (two) times daily as needed. 09/14/23  Yes Silver Fell A, PA  famotidine  (PEPCID ) 20 MG tablet Take 20 mg by mouth 2 (two) times daily.   Yes [provider]  fluticasone  (FLONASE ) 50 MCG/ACT nasal spray Place 2 sprays into both nostrils daily. 04/10/23  Yes Rising, Asberry, PA-C  lidocaine  (LIDODERM ) 5 %  Place 1 patch onto the skin daily. Remove & Discard patch within 12 hours or as directed by MD 03/17/24  Yes Keith, Kayla N, PA-C  loratadine  (CLARITIN ) 10 MG tablet Take 10 mg by mouth every morning.   Yes [provider]  ondansetron  (ZOFRAN ) 8 MG tablet Take 1 tab (8 mg) by mouth every 8 hrs as needed for nausea/vomiting. Start third day after doxorubicin /cyclophosphamide  chemotherapy. 03/26/24  Yes [name], MD  oxyCODONE  (ROXICODONE ) 5 MG immediate release tablet Take 1 tablet (5 mg total) by mouth every 6 (six) hours as needed. 03/31/24 03/31/25 Yes Curvin Mt III, MD  pantoprazole  (PROTONIX ) 40 MG tablet Take 1 tablet (40 mg total) by mouth 2 (two) times daily. 04/07/24  Yes Franchot [name], NP  tiZANidine  (ZANAFLEX ) 2 MG tablet Take 1 tablet (2 mg total) by mouth every 8 (eight) hours as needed for muscle spasms. 03/17/24  Yes Keith, Kayla N, PA-C  dexamethasone  (DECADRON ) 4 MG tablet Take 2 tablets (8 mg total) by mouth daily for 3 days. Start the day after doxorubicin /cyclophosphamide  chemotherapy. Take with food. Patient not taking: Reported on 03/28/2024 03/26/24   [name], MD  lidocaine  (XYLOCAINE ) 5 % ointment Apply 1 Application topically as  needed. 11/11/22   Raspet, Erin K, PA-C  lidocaine -prilocaine  (EMLA ) cream Apply to affected area once Patient not taking: Reported on 04/08/2024 03/26/24   [name], MD  prochlorperazine  (COMPAZINE ) 10 MG tablet Take 1 tablet (10 mg total) by mouth every 6 (six) hours as needed for nausea or vomiting. Patient not taking: Reported on 04/15/2024 03/26/24   [name], MD  Rivaroxaban  15 & 20 MG TBPK Take as directed on package: Start with one 15mg  tablet by mouth twice a day with food. On Day 22, switch to one 20mg  tablet once a day with food. Patient not taking: Reported on 04/02/2020 03/05/13 06/25/20  Charlyn Sora, MD    Physical Exam: Vitals:   04/15/24 0800 04/15/24 0900 04/15/24 1130 04/15/24 1234  BP: 117/82 111/78 (!) 132/93   Pulse: 90 82 89   Resp: 20 14 16    Temp: 98 F (36.7 C)   98.8 F (37.1 C)  TempSrc: Oral   Oral  SpO2: 97% 99% 97%     General: No acute respiratory distress Neck/Chest: well healing surgical wounds of R chest wall and neck with no apparent erythema or discharge and with only minimal swelling of the R lateral neck  Lungs: Clear to auscultation bilaterally without wheezes or crackles Cardiovascular: Regular rate and rhythm without murmur gallop or rub normal S1 and S2 Abdomen: Nontender, nondistended, soft, bowel sounds positive, no rebound, no ascites, no appreciable mass Extremities: trace B LE edema B LE without erythema or wounds appreciable    Labs on Admission:   CBC: Recent Labs  Lab 04/15/24 0237 04/15/24 1315  WBC 5.2 4.4  NEUTROABS 3.6  --   HGB 12.5 12.2  HCT 36.7 36.2  MCV 83.8 82.8  PLT 87* 78*   Basic Metabolic Panel: Recent Labs  Lab 04/15/24 0237  NA 138  K 3.9  CL 103  CO2 25  GLUCOSE 99  BUN 8  CREATININE 0.75  CALCIUM 9.2   GFR: Estimated Creatinine Clearance: 112.9 mL/min (by C-G formula based on SCr of 0.75 mg/dL).  Liver Function Tests: Recent Labs  Lab 04/15/24 0237  AST 15  ALT 18  ALKPHOS 84   BILITOT 0.4  PROT 6.3*  ALBUMIN 4.0    Radiological Exams on  Admission:  CT Angio Chest PE W and/or Wo Contrast Result Date: 04/15/2024 IMPRESSION: 1. No pulmonary embolism. 2. Ground-glass and reticular opacities in the lower lobes bilaterally, likely representing edema and atelectasis. Electronically signed by: Evalene Coho MD 04/15/2024 04:10 AM EST RP Workstation: [id number]   CT Soft Tissue Neck W Contrast Result Date: 04/15/2024 MPRESSION: 1. Right-sided internal jugular chest port with intraluminal thrombus above the level of the catheter extending approximately 4.7 cm. Electronically signed by: Evalene Coho MD 04/15/2024 04:05 AM EST RP Workstation: [id number]   US  RT BREAST BX W LOC DEV 1ST LESION IMG BX SPEC US  GUIDE Result Date: 04/14/2024 IMPRESSION: 1. Ultrasound guided biopsy of 0.9 cm RIGHT breast mass (9 o'clock 2 CMFN). No apparent complications. 2. Ultrasound-guided biopsy of RIGHT axillary lymph node with borderline cortical thickening measuring up to 0.4 cm could not be performed due to its close proximity to a large artery. Electronically Signed   By: Aliene Lloyd M.D.   On: 04/14/2024 12:31   US  LIMITED ULTRASOUND INCLUDING AXILLA RIGHT BREAST Result Date: 04/14/2024 IMPRESSION: 1. Indeterminate 0.9 cm RIGHT breast mass (9 o'clock 2 CMFN), corresponding to the mass seen on MRI. 2. Borderline thickening of RIGHT axillary lymph node measuring up to 0.4 cm. RECOMMENDATION: 1. Ultrasound-guided core needle biopsy 0.9 cm RIGHT breast mass (9 o'clock 2 CMFN). 2. Ultrasound-guided core needle biopsy of RIGHT axillary lymph node with cortical thickness measuring 0.4 cm. I have discussed the findings and recommendations with the patient. The biopsy procedure was explained to the patient and questions were answered. Patient expressed their understanding of the biopsy recommendation. Patient will be scheduled for biopsy at her earliest convenience by the schedulers. Ordering  provider will be notified. If applicable, a reminder letter will be sent to the patient regarding the next appointment. BI-RADS CATEGORY  4: Suspicious. Electronically Signed   By: Aliene Lloyd M.D.   On: 04/14/2024 12:29   MM CLIP PLACEMENT RIGHT Result Date: 04/14/2024 IMPRESSION: Appropriate positioning of the Venus shaped biopsy marking clip at the site of biopsy in the anterior depth of the outer RIGHT breast. Final Assessment: Post Procedure Mammograms for Marker Placement Electronically Signed   By: Aliene Lloyd M.D.   On: 04/14/2024 12:25    Reyes IVAR Moores, MD [facility] Office  [phone] Pager - Text Page per Amion as per below:  On-Call/Text Page:      [email or web address]  If 7PM-7AM, please contact night-coverage [email or web address] 04/15/2024, 3:25 PM        [1]  Allergies Allergen Reactions   Nsaids Other (See Comments)    Eliquis     "

## 2024-04-15 NOTE — ED Triage Notes (Addendum)
 Started Chemo last week. Intense foot pain today- cannot stand on feet.-starting Sunday. Pain around port placement and entire upper back-starting Friday.

## 2024-04-15 NOTE — Plan of Care (Signed)
 Patient evaluated by clinical research associate at the bedside after [facility]. [facility] with acute onset of severe pain around her port with associated neck pain and tenderness. She was found to have a right IJ chest port with intraluminal thrombus above the level of the catheter extending approximately 4.7 cm on CT. She has been admitted and started on IV heparin . Vascular surgery and IR were consulted prior to arrival. Patient evaluated at bedside with spouse in the room. She reports improved neck pain after receiving IV Dilaudid . She denies any shortness of breath or chest pain.  States her port was placed 2 weeks ago for chemotherapy but since placement, she has had some discomfort around her right neck and when she swallows. - Continue heparin  drip and PRN pain medications - I have placed an IR consult order, day team to follow-up in the morning

## 2024-04-15 NOTE — ED Provider Notes (Signed)
 " [place] EMERGENCY DEPARTMENT AT [facility] Provider Note   CSN: [id number] Arrival date & time: 04/15/24  0227     Patient presents with: Vascular Access Problem   Monica Holland is a 38 y.o. female.   HPI   38 year old female with medical history significant for breast cancer on chemotherapy with a Port-A-Cath in place, DVT on Eliquis  presenting to the emergency department with multiple complaints.  The patient states that she got her first round of chemotherapy 1 week ago.  She has had severe bilateral plantar foot pain that started on Sunday but became unbearable today.  Pain is like a burning severe pain on the bottom of both feet along the plantar aspect.  No trauma to the feet.  She has never had neuropathy before.  Unclear if this was a side effect of her chemotherapy.  The patient also states that she is having significant pain around her port cath placement site starting on Friday with associated neck swelling and pain and discomfort.  The right side of her neck is tender.  She denies any chest pain, no ripping or tearing pain, no radiation to the back.  Prior to Admission medications  Medication Sig Start Date End Date Taking? Authorizing Provider  albuterol  (VENTOLIN  HFA) 108 (90 Base) MCG/ACT inhaler Inhale 1-2 puffs into the lungs every 6 (six) hours as needed for wheezing or shortness of breath. 02/08/22   Lynwood Lenis, PA-C  apixaban  (ELIQUIS ) 5 MG TABS tablet Take 1 tablet (5 mg total) by mouth 2 (two) times daily. 06/12/23   Franchot [name], NP  dexamethasone  (DECADRON ) 4 MG tablet Take 2 tablets (8 mg total) by mouth daily for 3 days. Start the day after doxorubicin /cyclophosphamide  chemotherapy. Take with food. Patient not taking: Reported on 04/08/2024 03/26/24   [name], MD  dicyclomine  (BENTYL ) 20 MG tablet Take 1 tablet (20 mg total) by mouth 2 (two) times daily as needed. 09/14/23   Silver Wonda LABOR, PA  fluticasone  (FLONASE ) 50 MCG/ACT nasal spray  Place 2 sprays into both nostrils daily. 04/10/23   Rising, Asberry, PA-C  lidocaine  (LIDODERM ) 5 % Place 1 patch onto the skin daily. Remove & Discard patch within 12 hours or as directed by MD 03/17/24   Keith, Kayla N, PA-C  lidocaine  (XYLOCAINE ) 5 % ointment Apply 1 Application topically as needed. 11/11/22   Raspet, Erin K, PA-C  lidocaine -prilocaine  (EMLA ) cream Apply to affected area once Patient not taking: Reported on 04/08/2024 03/26/24   [name], MD  ondansetron  (ZOFRAN ) 8 MG tablet Take 1 tab (8 mg) by mouth every 8 hrs as needed for nausea/vomiting. Start third day after doxorubicin /cyclophosphamide  chemotherapy. Patient not taking: Reported on 04/08/2024 03/26/24   [name], MD  oxyCODONE  (ROXICODONE ) 5 MG immediate release tablet Take 1 tablet (5 mg total) by mouth every 6 (six) hours as needed. 03/31/24 03/31/25  Curvin Mt III, MD  pantoprazole  (PROTONIX ) 40 MG tablet Take 1 tablet (40 mg total) by mouth 2 (two) times daily. 04/07/24   Franchot [name], NP  prochlorperazine  (COMPAZINE ) 10 MG tablet Take 1 tablet (10 mg total) by mouth every 6 (six) hours as needed for nausea or vomiting. 03/26/24   [name], MD  tiZANidine  (ZANAFLEX ) 2 MG tablet Take 1 tablet (2 mg total) by mouth every 8 (eight) hours as needed for muscle spasms. 03/17/24   Keith, Kayla N, PA-C  Rivaroxaban  15 & 20 MG TBPK Take as directed on package: Start with one 15mg   tablet by mouth twice a day with food. On Day 22, switch to one 20mg  tablet once a day with food. Patient not taking: Reported on 04/02/2020 03/05/13 06/25/20  Nanavati, Ankit, MD    Allergies: Nsaids    Review of Systems  All other systems reviewed and are negative.   Updated Vital Signs BP 112/75   Pulse 72   Temp 98.5 F (36.9 C) (Oral)   Resp 14   LMP  (LMP Unknown)   SpO2 99%   Physical Exam Vitals and nursing note reviewed.  Constitutional:      General: She is not in acute distress.    Appearance: She is well-developed.   HENT:     Head: Normocephalic and atraumatic.  Eyes:     Conjunctiva/sclera: Conjunctivae normal.  Neck:     Comments: Right-sided neck with palpable swelling, no crepitus, tenderness to palpation with warmth present Cardiovascular:     Rate and Rhythm: Normal rate and regular rhythm.     Heart sounds: No murmur heard. Pulmonary:     Effort: Pulmonary effort is normal. No respiratory distress.     Breath sounds: Normal breath sounds.  Abdominal:     Palpations: Abdomen is soft.     Tenderness: There is no abdominal tenderness.  Musculoskeletal:        General: No swelling.     Cervical back: Neck supple.     Comments: Bilateral feet with 2+ DP pulses, no obvious erythema, no tenderness of the metacarpals, no tenderness about the plantar aspect bilaterally  Skin:    General: Skin is warm and dry.     Capillary Refill: Capillary refill takes less than 2 seconds.  Neurological:     Mental Status: She is alert.  Psychiatric:        Mood and Affect: Mood normal.     (all labs ordered are listed, but only abnormal results are displayed) Labs Reviewed  COMPREHENSIVE METABOLIC PANEL WITH GFR - Abnormal; Notable for the following components:      Result Value   Total Protein 6.3 (*)    All other components within normal limits  CBC WITH DIFFERENTIAL/PLATELET - Abnormal; Notable for the following components:   Platelets 87 (*)    All other components within normal limits  LACTIC ACID, PLASMA  HCG, SERUM, QUALITATIVE  URINALYSIS, W/ REFLEX TO CULTURE (INFECTION SUSPECTED)  PREGNANCY, URINE  HEPARIN  LEVEL (UNFRACTIONATED)    EKG: None  Radiology: CT Angio Chest PE W and/or Wo Contrast Result Date: 04/15/2024 EXAM: CTA CHEST 04/15/2024 03:56:03 AM TECHNIQUE: CTA of the chest was performed after the administration of intravenous contrast. Multiplanar reformatted images are provided for review. MIP images are provided for review. Automated exposure control, iterative  reconstruction, and/or weight based adjustment of the mA/kV was utilized to reduce the radiation dose to as low as reasonably achievable. COMPARISON: CT of the chest dated 04/04/2024. CLINICAL HISTORY: Pulmonary embolism (PE) suspected, high prob. FINDINGS: PULMONARY ARTERIES: Pulmonary arteries are adequately opacified for evaluation. No acute pulmonary embolus. Main pulmonary artery is normal in caliber. MEDIASTINUM: The heart and pericardium demonstrate no acute abnormality. There is no acute abnormality of the thoracic aorta. LYMPH NODES: No mediastinal, hilar or axillary lymphadenopathy. LUNGS AND PLEURA: There are ground-glass and reticular opacities present dependently within the lower lobes bilaterally, likely representing combination of edema and atelectasis. No evidence of pleural effusion or pneumothorax. UPPER ABDOMEN: Limited images of the upper abdomen are unremarkable. SOFT TISSUES AND BONES: A right internal jugular  chest port is present. There is a 13 mm round cystic lesion within the inferior pole of the left lobe of the thyroid. No acute bone abnormality. IMPRESSION: 1. No pulmonary embolism. 2. Ground-glass and reticular opacities in the lower lobes bilaterally, likely representing edema and atelectasis. Electronically signed by: Evalene Coho MD 04/15/2024 04:10 AM EST RP Workstation: [id number]   CT Soft Tissue Neck W Contrast Result Date: 04/15/2024 EXAM: CT NECK WITH CONTRAST 04/15/2024 03:56:03 AM TECHNIQUE: CT of the neck was performed with the administration of 100 mL of iohexol  (OMNIPAQUE ) 350 MG/ML injection. Multiplanar reformatted images are provided for review. Automated exposure control, iterative reconstruction, and/or weight based adjustment of the mA/kV was utilized to reduce the radiation dose to as low as reasonably achievable. COMPARISON: None available. CLINICAL HISTORY: Neck infection. FINDINGS: AERODIGESTIVE TRACT: No discrete mass. No edema. SALIVARY GLANDS: The  parotid and submandibular glands are unremarkable. THYROID: There is a 13 mm round cystic lesion present within the inferior pole of the left lobe of the thyroid. LYMPH NODES: No suspicious cervical lymphadenopathy. SOFT TISSUES: There is a right-sided internal jugular chest port present. There is intraluminal thrombus present within the internal jugular vein above the level of the catheter extending approximately 4.7 cm. No mass or fluid collection. BONES: No abnormality. OTHER: Visualized sinuses and mastoid air cells are well aerated. Visualized lungs are clear. IMPRESSION: 1. Right-sided internal jugular chest port with intraluminal thrombus above the level of the catheter extending approximately 4.7 cm. 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 EXAM: ULTRASOUND GUIDED RIGHT BREAST CORE NEEDLE BIOPSY COMPARISON:  Previous exam(s). PROCEDURE: I met with the patient and we discussed the procedure of ultrasound-guided biopsy, including benefits and alternatives. We discussed the high likelihood of a successful procedure. We discussed the risks of the procedure, including infection, bleeding, tissue injury, clip migration, and inadequate sampling. Informed written consent was given. The usual time-out protocol was performed immediately prior to the procedure. Lesion quadrant: Upper outer quadrant Using sterile technique and 1% Lidocaine  as local anesthetic, under direct ultrasound visualization, a 14 gauge spring-loaded device was used to perform biopsy of 0.9 cm RIGHT breast mass using a superior approach. At the conclusion of the procedure venus shaped tissue marker clip was deployed into the biopsy cavity.  Follow up 2 view mammogram was performed and dictated separately. IMPRESSION: 1. Ultrasound guided biopsy of 0.9 cm RIGHT breast mass (9 o'clock 2 CMFN). No apparent complications. 2. Ultrasound-guided biopsy of RIGHT axillary lymph node with borderline cortical thickening measuring up to 0.4 cm could not be performed due to its close proximity to a large artery. Electronically Signed   By: Aliene Lloyd M.D.   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 EXAM: ULTRASOUND OF THE RIGHT BREAST COMPARISON:  MRI breast 04/04/2024. Diagnostic mammogram 03/19/2024. FINDINGS: Targeted sonographic evaluation of the RIGHT breast and axilla was performed. 0.9 x 0.4 x 0.8 cm oval circumscribed hypoechoic mass seen  at 9 o'clock 2 CMFN corresponds to the mass seen on MRI. RIGHT axillary lymph node with borderline cortical thickening measuring 0.4 cm. IMPRESSION: 1. Indeterminate 0.9 cm RIGHT breast mass (9 o'clock 2 CMFN), corresponding to the mass seen on MRI. 2. Borderline thickening of RIGHT axillary lymph node measuring up to 0.4 cm. RECOMMENDATION: 1. Ultrasound-guided core needle biopsy 0.9 cm RIGHT breast mass (9 o'clock 2 CMFN). 2. Ultrasound-guided core needle biopsy of RIGHT axillary lymph node with cortical thickness measuring 0.4 cm. I have discussed the findings and recommendations with the patient. The biopsy procedure was explained to the patient and questions were answered. Patient expressed their understanding of the biopsy recommendation. Patient will be scheduled for biopsy at her earliest convenience by the schedulers. Ordering provider will be notified. If applicable, a reminder letter will be  sent to the patient regarding the next appointment. BI-RADS CATEGORY  4: Suspicious. Electronically Signed   By: Aliene Lloyd M.D.   On: 04/14/2024 12:29   MM CLIP PLACEMENT RIGHT Result Date: 04/14/2024 CLINICAL DATA:  Status post ultrasound-guided biopsy of RIGHT breast mass EXAM: 3D DIAGNOSTIC RIGHT MAMMOGRAM POST ULTRASOUND BIOPSY COMPARISON:  Previous exam(s). ACR Breast Density Category c: The breasts are heterogeneously dense, which may obscure small masses. FINDINGS: 3D Mammographic images were obtained following ultrasound guided biopsy of RIGHT breast mass. The biopsy marking clip is in expected position at the site of biopsy. IMPRESSION: Appropriate positioning of the Venus shaped biopsy marking clip at the site of biopsy in the anterior depth of the outer RIGHT breast. Final Assessment: Post Procedure Mammograms for Marker Placement Electronically Signed   By: Aliene Lloyd M.D.   On: 04/14/2024 12:25     Procedures   Medications Ordered in the ED  heparin  ADULT infusion 100 units/mL (25000 units/250mL) (1,350 Units/hr Intravenous New Bag/Given 04/15/24 0449)  vancomycin  (VANCOCIN ) IVPB 1000 mg/200 mL premix (1,000 mg Intravenous New Bag/Given 04/15/24 0558)  piperacillin -tazobactam (ZOSYN ) IVPB 3.375 g (0 g Intravenous Paused 04/15/24 0559)  HYDROmorphone  (DILAUDID ) injection 1 mg (1 mg Intravenous Given 04/15/24 0328)  iohexol  (OMNIPAQUE ) 350 MG/ML injection 100 mL (100 mLs Intravenous Contrast Given 04/15/24 0342)  heparin  bolus via infusion 4,000 Units (4,000 Units Intravenous Bolus from Bag 04/15/24 0447)                                    Medical Decision Making Amount and/or Complexity of Data Reviewed Labs: ordered. Radiology: ordered.  Risk Prescription drug management. Decision regarding hospitalization.    38 year old female with medical history significant for breast cancer on chemotherapy with a Port-A-Cath in place, DVT on Eliquis  presenting to the emergency  department with multiple complaints.  The patient states that she got her first round of chemotherapy 1 week ago.  She has had severe bilateral plantar foot pain that started on Sunday but became unbearable today.  Pain is like a burning severe pain on the bottom of both feet along the plantar aspect.  No trauma to the feet.  She has never had neuropathy before.  Unclear if this was a side effect of her chemotherapy.  The patient also states that she is having significant pain around her port cath placement site starting on Friday with associated neck swelling and pain and discomfort.  The right side of her neck is tender.  She denies any chest pain, no ripping or tearing pain, no radiation to the back.  On arrival, the patient was afebrile, not tachycardic or tachypneic, BP 144/103, saturating 100% on room air.  Patient on exam with swelling and tenderness of the right neck about the SCM, warmth palpated, no crepitus palpated.  Differential diagnosis includes cellulitis, IJ thrombus, Lemierre's syndrome, port site infection although chest wall port site appears to be C/D/I, swelling extends from more superior incision site above the clavicle up into the patient's right neck.  She is protecting her airway, no stridor.  No trismus.  IV access was obtained and the patient was administered Dilaudid  as she was also experiencing what sounds to be bilateral neuropathy in the feet status post chemotherapy.  She has intact symmetric pulses, no concern for acute limb ischemia, no evidence of infection in the feet bilaterally.  Feeling symptomatically improved after initial round of Dilaudid .  Labs: CBC without a leukocytosis or anemia, CMP generally unremarkable, lactic acid normal, hCG negative.  CTA PE study: IMPRESSION:  1. No pulmonary embolism.  2. Ground-glass and reticular opacities in the lower lobes bilaterally, likely  representing edema and atelectasis.   CT Neck: IMPRESSION:  1. Right-sided  internal jugular chest port with intraluminal thrombus above the  level of the catheter extending approximately 4.7 cm.   The patient was started on IV heparin  given the findings of right-sided IJ chest port with intraluminal thrombus measuring 4.7 cm.  General surgery: Spoke with Dr. Valentine, who recommended vascular surgery consultation.  Spoke with vascular surgery, Dr. Pearline, who recommended IR consultation as vascular usually does not manage this issue.   IR: Spoke with Dr. Karalee of IR, who agreed with plan for ABX, recommending heparin  and admission for observation. Pt may need port replaced if no longer flushing and functioning.   Discussed with Dr. Charlton of hospitalist medicine who accepted the patient in admission.  Patient updated regarding the plan of care.  Started on broad-spectrum antibiotics and continued on heparin  gtt.     Final diagnoses:  Obstruction of central line, initial encounter  Neck pain  Neck swelling  Pain in both feet    ED Discharge Orders     None          Jerrol Agent, MD 04/15/24 [phone]  "

## 2024-04-15 NOTE — ED Notes (Signed)
 Lab courier notified at this time to head this way for 12N Heparin  draw.

## 2024-04-15 NOTE — Progress Notes (Signed)
 HPI:  Monica Holland was previously seen in the [facility] due to a personal and family history of breast cancer and concerns regarding a hereditary predisposition to cancer. Please refer to our prior cancer genetics clinic note for more information regarding our discussion, assessment and recommendations, at the time. Monica Holland recent genetic test results were disclosed to her, as were recommendations warranted by these results. These results and recommendations are discussed in more detail below.  CANCER HISTORY:  Oncology History  Malignant neoplasm of upper-outer quadrant of left breast in female, estrogen receptor positive (HCC)  03/19/2024 Cancer Staging   Staging form: Breast, AJCC 8th Edition - Clinical stage from 03/19/2024: Stage IIA (cT2, cN1, cM0, G2, ER+, PR+, HER2-) - Signed by [name], MD on 03/26/2024 Stage prefix: Initial diagnosis Histologic grading system: 3 grade system   03/24/2024 Initial Diagnosis   Malignant neoplasm of upper-outer quadrant of left breast in female, estrogen receptor positive (HCC)   04/08/2024 -  Chemotherapy   Patient is on Treatment Plan : BREAST DOSE DENSE AC q14d / PACLitaxel q7d     04/08/2024 Genetic Testing   Negative genetic testing. AXIN2  p.S796G (c.2386A>G)  and SMAD4 p.I228V (c.682A>G)  VUS The report date is April 08, 2024.  The CancerNext-Expanded gene panel offered by [facility] and includes sequencing, rearrangement, and RNA analysis for the following 77 genes: AIP, ALK, APC, ATM, BAP1, BARD1, BMPR1A, BRCA1, BRCA2, BRIP1, CDC73, CDH1, CDK4, CDKN1B, CDKN2A, CEBPA, CHEK2, CTNNA1, DDX41, DICER1, ETV6, FH, FLCN, GATA2, LZTR1, MAX, MBD4, MEN1, MET, MLH1, MSH2, MSH3, MSH6, MUTYH, NF1, NF2, NTHL1, PALB2, PHOX2B, PMS2, POT1, PRKAR1A, PTCH1, PTEN, RAD51C, RAD51D, RB1, RET, RPS20, RUNX1, SDHA, SDHAF2, SDHB, SDHC, SDHD, SMAD4, SMARCA4, SMARCB1, SMARCE1, STK11, SUFU, TMEM127, TP53, TSC1, TSC2, VHL, and WT1 (sequencing and  deletion/duplication); AXIN2, CTNNA1, DDX41, EGFR, HOXB13, KIT, MBD4, MITF, MSH3, PDGFRA, POLD1 and POLE (sequencing only); EPCAM and GREM1 (deletion/duplication only). RNA data is routinely analyzed for use in variant interpretation for all genes.      FAMILY HISTORY:  We obtained a detailed, 4-generation family history.  Significant diagnoses are listed below: Family History  Problem Relation Age of Onset   Deep vein thrombosis Mother    Hyperlipidemia Mother    Diabetes Mother    High Cholesterol Mother    Heart attack Father    Breast cancer Paternal Aunt    Colon cancer Paternal Grandmother    Pancreatitis Half-Brother        paternal half brother   Down syndrome Half-Sister        maternal half sister   Learning disabilities Half-Sister        maternal half sister   Stomach cancer Neg Hx    Esophageal cancer Neg Hx    Rectal cancer Neg Hx        Significant family history reported includes: Paternal aunt with breast cancer Paternal half brother with pancreatitis Maternal half sister with learning disabilities/mental retardation Maternal half sister with Down Syndrome   Monica Holland is unaware of previous family history of genetic testing for hereditary cancer risks.  There is no reported Ashkenazi Jewish ancestry. There is no known consanguinity.  GENETIC TEST RESULTS: Genetic testing reported out on April 08, 2024 through the CancerNext-Expanded+RNAinsight cancer panel found no pathogenic mutations. The CancerNext-Expanded gene panel offered by [facility] and includes sequencing, rearrangement, and RNA analysis for the following 77 genes: AIP, ALK, APC, ATM, BAP1, BARD1, BMPR1A, BRCA1, BRCA2, BRIP1, CDC73, CDH1, CDK4, CDKN1B,  CDKN2A, CEBPA, CHEK2, CTNNA1, DDX41, DICER1, ETV6, FH, FLCN, GATA2, LZTR1, MAX, MBD4, MEN1, MET, MLH1, MSH2, MSH3, MSH6, MUTYH, NF1, NF2, NTHL1, PALB2, PHOX2B, PMS2, POT1, PRKAR1A, PTCH1, PTEN, RAD51C, RAD51D, RB1, RET, RPS20, RUNX1, SDHA, SDHAF2,  SDHB, SDHC, SDHD, SMAD4, SMARCA4, SMARCB1, SMARCE1, STK11, SUFU, TMEM127, TP53, TSC1, TSC2, VHL, and WT1 (sequencing and deletion/duplication); AXIN2, CTNNA1, DDX41, EGFR, HOXB13, KIT, MBD4, MITF, MSH3, PDGFRA, POLD1 and POLE (sequencing only); EPCAM and GREM1 (deletion/duplication only). RNA data is routinely analyzed for use in variant interpretation for all genes. . The test report has been scanned into EPIC and is located under the Molecular Pathology section of the Results Review tab.  A portion of the result report is included below for reference.     We discussed with Monica Holland that because current genetic testing is not perfect, it is possible there may be a gene mutation in one of these genes that current testing cannot detect, but that chance is small.  We also discussed, that there could be another gene that has not yet been discovered, or that we have not yet tested, that is responsible for the cancer diagnoses in the family. It is also possible there is a hereditary cause for the cancer in the family that Monica Holland did not inherit and therefore was not identified in her testing.  Therefore, it is important to remain in touch with cancer genetics in the future so that we can continue to offer Monica Holland the most up to date genetic testing.   Genetic testing did identify two Variants of uncertain significance (VUS) - one in the AXIN2 gene called  p.S796G (c.2386A>G) and a second in the SMARCA4 gene called p.I228V (c.682A>G).  At this time, it is unknown if these variants are associated with increased cancer risk or if they are normal findings, but most variants such as these get reclassified to being inconsequential. They should not be used to make medical management decisions. With time, we suspect the lab will determine the significance of these variants, if any. If we do learn more about them, we will try to contact Monica Holland to discuss it further. However, it is important to stay in touch with us   periodically and keep the address and phone number up to date.  ADDITIONAL GENETIC TESTING: We discussed with Monica Holland that her genetic testing was fairly extensive.  If there are genes identified to increase cancer risk that can be analyzed in the future, we would be happy to discuss and coordinate this testing at that time.    CANCER SCREENING RECOMMENDATIONS: Monica Holland test result is considered negative (normal).  This means that we have not identified a hereditary cause for her personal and family history of breast cancer at this time. Most cancers happen by chance and this negative test suggests that her personal and family history of breast cancer may fall into this category.    Possible reasons for Monica Holland's negative genetic test include:  1. There may be a gene mutation in one of these genes that current testing methods cannot detect but that chance is small.  2. There could be another gene that has not yet been discovered, or that we have not yet tested, that is responsible for the cancer diagnoses in the family.  3.  There may be no hereditary risk for cancer in the family. The cancers in Monica Holland and/or her family may be sporadic/familial or due to other genetic and environmental factors. 4. It is also possible  there is a hereditary cause for the cancer in the family that Monica Holland did not inherit.  Therefore, it is recommended she continue to follow the cancer management and screening guidelines provided by her oncology and primary healthcare provider. An individual's cancer risk and medical management are not determined by genetic test results alone. Overall cancer risk assessment incorporates additional factors, including personal medical history, family history, and any available genetic information that may result in a personalized plan for cancer prevention and surveillance  RECOMMENDATIONS FOR FAMILY MEMBERS:   Since she did not inherit a identifiable mutation in a cancer  predisposition gene included on this panel, her children could not have inherited a known mutation from her in one of these genes. Individuals in this family might be at some increased risk of developing cancer, over the general population risk, simply due to the family history of cancer.  We recommended women in this family have a yearly mammogram beginning at age 64, or 78 years younger than the earliest onset of cancer, an annual clinical breast exam, and perform monthly breast self-exams. Women in this family should also have a gynecological exam as recommended by their primary provider. All family members should be referred for colonoscopy starting at age 48, or 31 years younger than the earliest onset of cancer.  FOLLOW-UP: Lastly, we discussed with Monica Holland that cancer genetics is a rapidly advancing field and it is possible that new genetic tests will be appropriate for her and/or her family members in the future. We encouraged her to remain in contact with cancer genetics on an annual basis so we can update her personal and family histories and let her know of advances in cancer genetics that may benefit this family.   Our contact number was provided. Monica Holland questions were answered to her satisfaction, and she knows she is welcome to call us  at anytime with additional questions or concerns.   Monica Monte, MS, [facility] Licensed, Certified Genetic Counselor [email or web address]

## 2024-04-15 NOTE — Telephone Encounter (Signed)
Mailbox is full. Will send my chart message.

## 2024-04-15 NOTE — ED Notes (Signed)
 Lab tube for Heparin  level delivered to lab at this time. The courier is here.

## 2024-04-15 NOTE — Progress Notes (Signed)
 PHARMACY - ANTICOAGULATION CONSULT NOTE  Pharmacy Consult for Heparin  Indication: RIJ intraluminal thrombus  Vital Signs: Temp: 98.8 F (37.1 C) (12/30 1234) Temp Source: Oral (12/30 1234) BP: 132/93 (12/30 1130) Pulse Rate: 89 (12/30 1130)  Labs: Recent Labs    04/15/24 0237 04/15/24 1137 04/15/24 1315  HGB 12.5  --   --   HCT 36.7  --   --   PLT 87*  --   --   APTT  --   --  95*  HEPARINUNFRC  --  0.39  --   CREATININE 0.75  --   --     Estimated Creatinine Clearance: 112.9 mL/min (by C-G formula based on SCr of 0.75 mg/dL).  Assessment: 38 y.o. female presented to th e ED with foot pain and pain around her port. Found to have have an intraluminal thrombus and started on IV heparin . Pt is on apixaban  PTA. Initial heparin  level and aPTT are therapeutic. No bleeding noted. Of note, pt is thrombocytopenic with platelets of 87>>78 (previously normal). Will monitor closely.   Goal of Therapy:  Heparin  level 0.3-0.7 units/ml Monitor platelets by anticoagulation protocol: Yes   Plan:  Continue heparin  gtt 1350 units/hr Recheck heparin  level tonight to confirm dosing Daily heparin  level and CBC  Vernell Meier, PharmD, BCPS, BCEMP Clinical Pharmacist Please see AMION for all pharmacy numbers 04/15/2024 1:44 PM

## 2024-04-15 NOTE — Progress Notes (Signed)
 Plan of Care Note for accepted transfer   Patient: Monica Holland MRN: [id number]   DOA: 04/15/2024  Facility requesting transfer: [facility]   Requesting Provider: Dr. Jerrol   Reason for transfer: Right internal jugular thrombus   Facility course: 38 yr old female with hx of PE on Eliquis , tricuspid regurgitation, and breast cancer who had port placed on 12/15 and recently started chemo who now presents with pain around the port.   She is noted to have redness and swelling around the right IJ port and CT reveals thrombus. IR was consulted, IV heparin  was started, and she was also given vancomycin  and Zosyn .   Plan of care: The patient is accepted for admission to Telemetry unit, at [facility].   Author: Evalene GORMAN Sprinkles, MD 04/15/2024  Check [email or web address] for on-call coverage.  Nursing staff, Please call [facility] Admits & Consults System-Wide number on Amion as soon as patient's arrival, so appropriate admitting provider can evaluate the pt.

## 2024-04-15 NOTE — Progress Notes (Signed)
 PHARMACY - ANTICOAGULATION CONSULT NOTE  Pharmacy Consult for Heparin  Indication: internal jugular thrombus  Allergies[1]  Patient Measurements:    Vital Signs: Temp: 98.5 F (36.9 C) (12/30 0234) Temp Source: Oral (12/30 0234) BP: 134/94 (12/30 0338) Pulse Rate: 83 (12/30 0338)  Labs: Recent Labs    04/15/24 0237  HGB 12.5  HCT 36.7  PLT 87*  CREATININE 0.75    Estimated Creatinine Clearance: 112.9 mL/min (by C-G formula based on SCr of 0.75 mg/dL).   Medical History: Past Medical History:  Diagnosis Date   Abnormal uterine bleeding (AUB)    heavy vaginal bleeding   Anticoagulant long-term use    eliquis --- managed by dr timmy (hematologist)   Anxiety    Arthritis    Breast CA (HCC) 03/19/2024   left   Cancer (HCC)    Chronic nausea    per pt intermittant , without vomiting,  had normal EGD 12-29-2021 by dr federico   Chronic venous insufficiency of lower extremity    evaluted by dr debby robertson (vascular) 03-15-2021 note in epic, chronic venous insuff LLE causing edema/ post thombotic syndrome   Depression    Family history of breast cancer    GERD (gastroesophageal reflux disease)    History of pulmonary embolus (PE) 2014   nonocclusive and RLE DVT   History of recurrent deep vein thrombosis (DVT)    LLE in 2004, 2009, 2016, 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   Peripheral vascular disease    Wears glasses     Medications:  Medications Ordered Prior to Encounter[2]   Assessment: 38 y.o. female with RI internal jugular chest port thrombus for heparin  Goal of Therapy:  Heparin  level 0.3-0.7 units/ml Monitor platelets by anticoagulation protocol: Yes   Plan:  Heparin  4000 units IV bolus, then start heparin  1350 units/hr Check heparin  level in 6 hours.   Monica Holland, Monica Holland 04/15/2024,4:38 AM      [1]  Allergies Allergen Reactions   Nsaids Other (See Comments)    Eliquis    [2]  No current facility-administered medications on file prior to encounter.   Current Outpatient Medications on File Prior to Encounter  Medication Sig Dispense Refill   albuterol  (VENTOLIN  HFA) 108 (90 Base) MCG/ACT inhaler Inhale 1-2 puffs into the lungs every 6 (six) hours as needed for wheezing or shortness of breath. 8 g 0   apixaban  (ELIQUIS ) 5 MG TABS tablet Take 1 tablet (5 mg total) by mouth 2 (two) times daily. 60 tablet 3   dexamethasone  (DECADRON ) 4 MG tablet Take 2 tablets (8 mg total) by mouth daily for 3 days. Start the day after doxorubicin /cyclophosphamide  chemotherapy. Take with food. (Patient not taking: Reported on 04/08/2024) 30 tablet 1   dicyclomine  (BENTYL ) 20 MG tablet Take 1 tablet (20 mg total) by mouth 2 (two) times daily as needed. 20 tablet 0   fluticasone  (FLONASE ) 50 MCG/ACT nasal spray Place 2 sprays into both nostrils daily. 9.9 mL 2   lidocaine  (LIDODERM ) 5 % Place 1 patch onto the skin daily. Remove & Discard patch within 12 hours or as directed by MD 30 patch 0   lidocaine  (XYLOCAINE ) 5 % ointment Apply 1 Application topically as needed. 35.44 g 0   lidocaine -prilocaine  (EMLA ) cream Apply to affected area once (Patient not taking: Reported  on 04/08/2024) 30 g 3   ondansetron  (ZOFRAN ) 8 MG tablet Take 1 tab (8 mg) by mouth every 8 hrs as needed for nausea/vomiting. Start third day after doxorubicin /cyclophosphamide  chemotherapy. (Patient not taking: Reported on 04/08/2024) 30 tablet 1   oxyCODONE  (ROXICODONE ) 5 MG immediate release tablet Take 1 tablet (5 mg total) by mouth every 6 (six) hours as needed. 5 tablet 0   pantoprazole  (PROTONIX ) 40 MG tablet Take 1 tablet (40 mg total) by mouth 2 (two) times daily. 60 tablet 1   prochlorperazine  (COMPAZINE ) 10 MG tablet Take 1 tablet (10 mg total) by mouth every 6 (six) hours as needed for nausea or  vomiting. 30 tablet 1   tiZANidine  (ZANAFLEX ) 2 MG tablet Take 1 tablet (2 mg total) by mouth every 8 (eight) hours as needed for muscle spasms. 30 tablet 0   [DISCONTINUED] Rivaroxaban  15 & 20 MG TBPK Take as directed on package: Start with one 15mg  tablet by mouth twice a day with food. On Day 22, switch to one 20mg  tablet once a day with food. (Patient not taking: Reported on 04/02/2020) 51 each 0

## 2024-04-15 NOTE — ED Notes (Signed)
 [place] with cl called for transport

## 2024-04-15 NOTE — Progress Notes (Signed)
 PHARMACY - ANTICOAGULATION CONSULT NOTE  Pharmacy Consult for Heparin  Indication: RIJ intraluminal thrombus  Vital Signs: Temp: 98.4 F (36.9 C) (12/30 1854) Temp Source: Oral (12/30 1854) BP: 158/95 (12/30 1854) Pulse Rate: 82 (12/30 1854)  Labs: Recent Labs    04/15/24 0237 04/15/24 1137 04/15/24 1315 04/15/24 1900  HGB 12.5  --  12.2  --   HCT 36.7  --  36.2  --   PLT 87*  --  78*  --   APTT  --   --  95*  --   HEPARINUNFRC  --  0.39  --  0.23*  CREATININE 0.75  --   --   --     Estimated Creatinine Clearance: 112.9 mL/min (by C-G formula based on SCr of 0.75 mg/dL).  Assessment: 38 y.o. female presented to th e ED with foot pain and pain around her port. Found to have have an intraluminal thrombus and started on IV heparin . Pt is on apixaban  PTA. Of note, pt is thrombocytopenic with platelets of 87>>78 (previously normal).  04/15/2024: Heparin  level @ 1900 =0.23- now sub-therapeutic on IV heparin  1350 units/hr CBC: Hg WNL, pltc 78-low No bleeding or infusion related concerns reported by RN  Goal of Therapy:  Heparin  level 0.3-0.7 units/ml Monitor platelets by anticoagulation protocol: Yes   Plan:  Give small heparin  bolus 2000 units IV x1 Increase heparin  gtt to 1600 units/hr Recheck heparin  level in ~6h after rate change Daily heparin  level and CBC  Rosaline Millet, PharmD, BCPS 04/15/2024 8:35 PM

## 2024-04-15 NOTE — Telephone Encounter (Signed)
 I contacted  Monica Holland to discuss her genetic testing results. No pathogenic variants were identified in the 77 genes analyzed. Discussed that we do not know why she has breast cancer or why there is cancer in the family. It could be due to a different gene that we are not testing, or maybe our current technology may not be able to pick something up.  It will be important for her to keep in contact with genetics to keep up with whether additional testing may be needed.Detailed clinic note to follow.   The test report will be scanned into EPIC and will be located under the Molecular Pathology section of the Results Review tab.  A portion of the result report is included below for reference.    Two VUS identified.  These will not change medical management.

## 2024-04-16 DIAGNOSIS — R221 Localized swelling, mass and lump, neck: Secondary | ICD-10-CM | POA: Diagnosis not present

## 2024-04-16 DIAGNOSIS — M79671 Pain in right foot: Secondary | ICD-10-CM

## 2024-04-16 DIAGNOSIS — I82C11 Acute embolism and thrombosis of right internal jugular vein: Secondary | ICD-10-CM | POA: Diagnosis not present

## 2024-04-16 DIAGNOSIS — M542 Cervicalgia: Secondary | ICD-10-CM | POA: Diagnosis not present

## 2024-04-16 DIAGNOSIS — D696 Thrombocytopenia, unspecified: Secondary | ICD-10-CM | POA: Diagnosis not present

## 2024-04-16 DIAGNOSIS — M79672 Pain in left foot: Secondary | ICD-10-CM | POA: Diagnosis not present

## 2024-04-16 LAB — BASIC METABOLIC PANEL WITH GFR
Anion gap: 8 (ref 5–15)
BUN: 9 mg/dL (ref 6–20)
CO2: 25 mmol/L (ref 22–32)
Calcium: 9.4 mg/dL (ref 8.9–10.3)
Chloride: 104 mmol/L (ref 98–111)
Creatinine, Ser: 0.82 mg/dL (ref 0.44–1.00)
GFR, Estimated: >60 mL/min
Glucose, Bld: 106 mg/dL — ABNORMAL HIGH (ref 70–99)
Potassium: 4.5 mmol/L (ref 3.5–5.1)
Sodium: 138 mmol/L (ref 135–145)

## 2024-04-16 LAB — CBC
HCT: 36.8 % (ref 36.0–46.0)
Hemoglobin: 12.4 g/dL (ref 12.0–15.0)
MCH: 28.1 pg (ref 26.0–34.0)
MCHC: 33.7 g/dL (ref 30.0–36.0)
MCV: 83.4 fL (ref 80.0–100.0)
Platelets: 84 K/uL — ABNORMAL LOW (ref 150–400)
RBC: 4.41 MIL/uL (ref 3.87–5.11)
RDW: 14 % (ref 11.5–15.5)
WBC: 3.9 K/uL — ABNORMAL LOW (ref 4.0–10.5)
nRBC: 0 % (ref 0.0–0.2)

## 2024-04-16 LAB — HEPARIN LEVEL (UNFRACTIONATED)
Heparin Unfractionated: 0.46 [IU]/mL (ref 0.30–0.70)
Heparin Unfractionated: 0.46 [IU]/mL (ref 0.30–0.70)

## 2024-04-16 MED ORDER — POLYETHYLENE GLYCOL 3350 17 G PO PACK
17.0000 g | PACK | Freq: Every day | ORAL | Status: DC
  Administered 2024-04-16: 17 g via ORAL
  Filled 2024-04-16 (×3): qty 1

## 2024-04-16 MED ORDER — GABAPENTIN 300 MG PO CAPS
300.0000 mg | ORAL_CAPSULE | Freq: Once | ORAL | Status: AC
  Administered 2024-04-16: 300 mg via ORAL
  Filled 2024-04-16: qty 1

## 2024-04-16 MED ORDER — SENNA 8.6 MG PO TABS
2.0000 | ORAL_TABLET | Freq: Every day | ORAL | Status: DC
  Administered 2024-04-16 – 2024-04-17 (×2): 17.2 mg via ORAL
  Filled 2024-04-16 (×2): qty 2

## 2024-04-16 MED ORDER — BISACODYL 5 MG PO TBEC
10.0000 mg | DELAYED_RELEASE_TABLET | Freq: Every day | ORAL | Status: DC
  Administered 2024-04-16: 10 mg via ORAL
  Filled 2024-04-16 (×3): qty 2

## 2024-04-16 NOTE — Progress Notes (Signed)
 PHARMACY - ANTICOAGULATION CONSULT NOTE  Pharmacy Consult for Heparin  Indication: RIJ intraluminal thrombus  Vital Signs: Temp: 98.3 F (36.8 C) (12/31 0727) Temp Source: Oral (12/31 0727) BP: 128/87 (12/31 0727) Pulse Rate: 73 (12/31 0727)  Labs: Recent Labs    04/15/24 0237 04/15/24 1137 04/15/24 1315 04/15/24 1900 04/16/24 0508  HGB 12.5  --  12.2  --  12.4  HCT 36.7  --  36.2  --  36.8  PLT 87*  --  78*  --  84*  APTT  --   --  95*  --   --   HEPARINUNFRC  --  0.39  --  0.23* 0.46  CREATININE 0.75  --   --   --  0.82    Estimated Creatinine Clearance: 110.1 mL/min (by C-G formula based on SCr of 0.82 mg/dL).  Assessment: 38 y.o. female presented to the ED with foot pain and pain around her port. Found to have have an intraluminal thrombus and started on IV heparin . Pt is on apixaban  PTA.   Today, 04/16/2024 Confirmatory heparin  level = 0.46 remains therapeutic on heparin  infusion of 1600 units/hr CBC: Hgb WNL, Plt low S/p chemotherapy on 04/08/24 No bleeding or complications reported  Goal of Therapy:  Heparin  level 0.3-0.7 units/ml Monitor platelets by anticoagulation protocol: Yes   Plan:  Continue heparin  at current rate of 1600 units/hr CBC, heparin  level daily Monitor for signs of bleeding Follow for long-term anticoagulation plan  If heparin  needs to be held for any invasive procedures, will need orders for when to hold peri-operatively.   Ronal CHRISTELLA Rav, PharmD 04/16/2024 11:11 AM

## 2024-04-16 NOTE — Plan of Care (Signed)

## 2024-04-16 NOTE — Plan of Care (Signed)

## 2024-04-16 NOTE — Progress Notes (Signed)
 " PROGRESS NOTE    Monica Holland  FMW:[id number] DOB: [birth year] DOA: 04/15/2024 PCP: [facility]  Brief Narrative: 38 year old with a history of PE and DVT on Eliquis , tricuspid valve regurgitation, and breast cancer status post port placement 03/31/2024 (per Dr. Curvin) with recent initiation of chemotherapy approximately 1 week prior who [facility] with the acute onset of severe pain around her port associated with swelling of the neck with tenderness to palpation over the right neck.  On exam she was found to have redness and swelling at the right IJ port.  CTa revealed no evidence of PE but did confirm a right IJ chest port with intraluminal thrombus above the level of the catheter extending approximately 4.7 cm.  This finding was discussed with general surgery who recommended vascular surgery consultation.  Vascular surgery was called and they recommended IR consultation.  IR was called and they recommended IV heparin , empiric antibiotics, and admission for observation with possible need to replace the port if it is no longer functional.  ROS was also positive for severe burning type pain on the bottom of both feet with no recent history of injury or trauma. This pain essentially resolved after a dose of IV dilaudid .      Assessment & Plan:   Principal Problem:   Acute thrombosis of right internal jugular vein (HCC) Active Problems:   Acute thrombosis of internal jugular vein, right (HCC)  R internal jugular port-a-cath with peri-catheter thrombus - catheter associated venous thrombosis  Cont IV heparin    IR consulted  would recommend heparin  no other intervention recommended empiric abx as suggested by IR Consulted oncology Dr. Tina continue IV heparin  for now?  Need for another anticoagulation other than Eliquis  since she is developing clots on Eliquis .  She has not missed any Eliquis  doses at home.   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 Ileana Continue IV heparin    Estimated body mass index is 26.14 kg/m as calculated from the following:   Height as of 03/31/24: 5' 11 (1.803 m).   Weight as of 04/08/24: 85 kg.  DVT prophylaxis: heparin  Code Status:full Family Communication:family at bedside Disposition Plan:  Status is: Inpatient Remains inpatient appropriate because: acute illness   Consultants:  onc  Procedures:none Antimicrobials:none  Subjective: C/o pain right neck   Objective: Vitals:   04/15/24 1854 04/15/24 2320 04/16/24 0320 04/16/24 0727  BP: (!) 158/95 129/88 (!) 138/96 128/87  Pulse: 82 72 76 73  Resp: 17 16  15   Temp: 98.4 F (36.9 C) 98.3 F (36.8 C) 98.3 F (36.8 C) 98.3 F (36.8 C)  TempSrc: Oral Oral Oral Oral  SpO2: 100% 100% 100% 100%    Intake/Output Summary (Last 24 hours) at 04/16/2024 1202 Last data filed at 04/16/2024 0900 Gross per 24 hour  Intake 780.6 ml  Output --  Net 780.6 ml   There were no vitals filed for this visit.  Examination:  General exam: Appears in no acute distress Respiratory system: Clear to auscultation. Respiratory effort normal.  Right upper chest neck tender around the right IJ with swelling Cardiovascular system: Regular Gastrointestinal system: Abdomen is nondistended, soft and nontender. No organomegaly or masses felt. Normal bowel sounds heard. Central nervous system: Alert and oriented.  No focal neurological deficits. Extremities: No edema  Data Reviewed: I have personally reviewed following labs and imaging studies  CBC: Recent Labs  Lab 04/15/24 0237 04/15/24 1315 04/16/24 0508  WBC 5.2 4.4 3.9*  NEUTROABS 3.6  --   --   HGB 12.5 12.2 12.4  HCT 36.7 36.2 36.8  MCV 83.8 82.8 83.4  PLT 87* 78* 84*   Basic Metabolic Panel: Recent  Labs  Lab 04/15/24 0237 04/16/24 0508  NA 138 138  K 3.9 4.5  CL 103 104  CO2 25 25  GLUCOSE 99 106*  BUN 8 9  CREATININE 0.75 0.82  CALCIUM 9.2 9.4   GFR: Estimated Creatinine Clearance: 110.1 mL/min (by C-G formula based on SCr of 0.82 mg/dL). Liver Function Tests: Recent Labs  Lab 04/15/24 0237  AST 15  ALT 18  ALKPHOS 84  BILITOT 0.4  PROT 6.3*  ALBUMIN 4.0   No results for input(s): LIPASE, AMYLASE in the last 168 hours. No results for input(s): AMMONIA in the last 168 hours. Coagulation Profile: No results for input(s): INR, PROTIME in the last 168 hours. Cardiac Enzymes: No results for input(s): CKTOTAL, CKMB, CKMBINDEX, TROPONINI in the last 168 hours. BNP (last 3 results) No results for input(s): PROBNP in the last 8760 hours. HbA1C: No results for input(s): HGBA1C in the last 72 hours. CBG: No results for input(s): GLUCAP in the last 168 hours. Lipid Profile: No results for input(s): CHOL, HDL, LDLCALC, TRIG, CHOLHDL, LDLDIRECT in the last 72 hours. Thyroid Function Tests: No results for input(s): TSH, T4TOTAL, FREET4, T3FREE, THYROIDAB in the last 72 hours. Anemia Panel: No results for input(s): VITAMINB12, FOLATE, FERRITIN, TIBC, IRON, RETICCTPCT in the last 72 hours. Sepsis Labs: Recent Labs  Lab 04/15/24 0237  LATICACIDVEN 0.9    No results found for this or any previous visit (from the past 240 hours).       Radiology Studies: CT Angio Chest PE W and/or Wo Contrast Result Date: 04/15/2024 EXAM: CTA CHEST 04/15/2024 03:56:03 AM TECHNIQUE: CTA of the chest was performed after the administration of intravenous contrast. Multiplanar reformatted images are provided for review. MIP images are provided for review. Automated exposure control, iterative reconstruction, and/or weight based adjustment of the mA/kV was utilized to reduce the radiation dose to as low as reasonably achievable.  COMPARISON: CT of the chest dated 04/04/2024. CLINICAL HISTORY: Pulmonary embolism (PE) suspected, high prob. FINDINGS: PULMONARY ARTERIES: Pulmonary arteries are adequately opacified for evaluation. No acute pulmonary embolus. Main pulmonary artery is normal in caliber. MEDIASTINUM: The heart and pericardium demonstrate no acute abnormality. There is no acute abnormality of the thoracic aorta. LYMPH NODES: No mediastinal, hilar or axillary lymphadenopathy. LUNGS AND PLEURA: There are ground-glass and reticular opacities present dependently within the lower lobes bilaterally, likely representing combination of edema and atelectasis. No evidence of pleural effusion or pneumothorax. UPPER ABDOMEN: Limited images of the upper abdomen are unremarkable. SOFT TISSUES AND BONES: A right internal jugular chest port is present. There is a 13 mm round cystic lesion within the inferior pole of the left lobe of the thyroid. No acute bone abnormality. IMPRESSION: 1. No pulmonary embolism. 2. Ground-glass and reticular opacities in the lower lobes bilaterally, likely representing edema and atelectasis. Electronically signed by: Evalene Coho MD 04/15/2024 04:10 AM EST RP Workstation: [id number]   CT Soft Tissue Neck W Contrast Result Date: 04/15/2024 EXAM: CT NECK WITH CONTRAST 04/15/2024 03:56:03 AM TECHNIQUE: CT of the neck was performed with the administration of 100 mL  of iohexol  (OMNIPAQUE ) 350 MG/ML injection. Multiplanar reformatted images are provided for review. Automated exposure control, iterative reconstruction, and/or weight based adjustment of the mA/kV was utilized to reduce the radiation dose to as low as reasonably achievable. COMPARISON: None available. CLINICAL HISTORY: Neck infection. FINDINGS: AERODIGESTIVE TRACT: No discrete mass. No edema. SALIVARY GLANDS: The parotid and submandibular glands are unremarkable. THYROID: There is a 13 mm round cystic lesion present within the inferior pole of the left  lobe of the thyroid. LYMPH NODES: No suspicious cervical lymphadenopathy. SOFT TISSUES: There is a right-sided internal jugular chest port present. There is intraluminal thrombus present within the internal jugular vein above the level of the catheter extending approximately 4.7 cm. No mass or fluid collection. BONES: No abnormality. OTHER: Visualized sinuses and mastoid air cells are well aerated. Visualized lungs are clear. IMPRESSION: 1. Right-sided internal jugular chest port with intraluminal thrombus above the level of the catheter extending approximately 4.7 cm. Electronically signed by: Evalene Coho MD 04/15/2024 04:05 AM EST RP Workstation: [id number]     Scheduled Meds:  bisacodyl  10 mg Oral Daily   pantoprazole   40 mg Oral BID   polyethylene glycol  17 g Oral Daily   senna  2 tablet Oral QHS   Continuous Infusions:  heparin  1,600 Units/hr (04/15/24 2043)   piperacillin -tazobactam (ZOSYN )  IV 3.375 g (04/16/24 0554)     LOS: 1 day   Monica KANDICE Hoots, MD 04/16/2024, 12:02 PM   "

## 2024-04-16 NOTE — Progress Notes (Signed)
 Monica Holland   DOB:[birth year]   FM#:[id number]      ASSESSMENT:  Monica Holland is a 38 year old female patient who presented on 04/15/2024 with complaints of swelling in her neck and pain in her feet.  Oncologic history is significant for breast cancer for which she is on active chemotherapy.  Medical oncology following.   ASSESSMENT & PLAN:  Acute thrombus Generalized pain and neck pain - CT neck done 04/15/2024 shows right-sided intraluminal thrombus within IJ vein above level of Port-A-Cath.  No pulmonary embolism seen. - Port was placed on 03/31/2024 by surgery team. - Agree with IV heparin  initiated yesterday evening, continue per protocol. - Evaluated by IR, noted no role for their intervention at this time.  History of PE - Reports that she has been compliant with anticoagulation for over 20 years.  States she has more recently been on Eliquis  and has taken twice a day per instructions.  Previously on Coumadin  and Xarelto . -Continue IV heparin  at this time. - Will plan transition to other anticoagulation in 48 to 72 hours.    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 - Started on neoadjuvant chemotherapy AC regimen every 14 days x 4 cycles.  Status post cycle 1 on 04/08/2024. - Patient reports bone pain and generalized body pain especially bilateral lower extremities after receiving cycle 1 chemotherapy.  States that she did take Claritin  as she was instructed. - Medical oncology/Dr. Lanny is primary oncologist.  Dr. Tina covering and will make further recommendations.  Thrombocytopenia Leukopenia - WBC 3.9.  Platelets 84K - Likely secondary to medication/chemotherapy - No transfusional intervention required at this time - Monitor closely for bleeding - Continue to monitor CBC with differential    Code Status Full  Subjective:  Patient seen awake and alert sitting up in  bed.  Family members at bedside.  Patient relates that she began to have a lot of pain on the right side of her neck and pain all over her body especially her legs, could not stand up for over 24 hours.  Denies shortness of breath or any bleeding.  Objective:   Intake/Output Summary (Last 24 hours) at 04/16/2024 1154 Last data filed at 04/16/2024 0900 Gross per 24 hour  Intake 780.6 ml  Output --  Net 780.6 ml     PHYSICAL EXAMINATION: ECOG PERFORMANCE STATUS: 2 - Symptomatic, <50% confined to bed  Vitals:   04/16/24 0320 04/16/24 0727  BP: (!) 138/96 128/87  Pulse: 76 73  Resp:  15  Temp: 98.3 F (36.8 C) 98.3 F (36.8 C)  SpO2: 100% 100%   There were no vitals filed for this visit.  GENERAL: alert, no distress and comfortable SKIN: skin color, texture, turgor are normal, no rashes or significant lesions EYES: normal, conjunctiva are pink and non-injected, sclera clear OROPHARYNX: no exudate, no erythema and lips, buccal mucosa, and tongue normal  NECK: supple, thyroid normal size, non-tender, without nodularity LYMPH: no palpable lymphadenopathy in the cervical, axillary or inguinal LUNGS: clear to auscultation and percussion with normal breathing effort HEART: regular rate & rhythm and no murmurs and no lower extremity edema ABDOMEN: abdomen soft, non-tender and normal bowel sounds MUSCULOSKELETAL: no cyanosis of digits and no clubbing  PSYCH: alert & oriented x 3 with fluent speech NEURO: no focal motor/sensory deficits   All questions were answered. The patient knows to call the clinic with any problems, questions or  concerns.   I personally spent a total of 40 minutes minutes in the care of the patient today including preparing to see the patient, performing a medically appropriate exam/evaluation, counseling and educating, referring and communicating with other health care professionals, documenting clinical information in the EHR, communicating results, and  coordinating care.    Olam PARAS Nari Vannatter, NP 04/16/2024 11:54 AM    Labs Reviewed:  Lab Results  Component Value Date   WBC 3.9 (L) 04/16/2024   HGB 12.4 04/16/2024   HCT 36.8 04/16/2024   MCV 83.4 04/16/2024   PLT 84 (L) 04/16/2024   Recent Labs    03/26/24 1301 04/08/24 0754 04/15/24 0237 04/16/24 0508  NA 141 140 138 138  K 4.2 4.0 3.9 4.5  CL 106 106 103 104  CO2 30 26 25 25   GLUCOSE 93 100* 99 106*  BUN 9 13 8 9   CREATININE 0.85 0.72 0.75 0.82  CALCIUM 9.6 9.2 9.2 9.4  GFRNONAA >60 >60 >60 >60  PROT 7.2 7.1 6.3*  --   ALBUMIN 4.5 4.5 4.0  --   AST 20 18 15   --   ALT 16 14 18   --   ALKPHOS 64 67 84  --   BILITOT 0.8 0.4 0.4  --     Studies Reviewed:   CT Angio Chest PE W and/or Wo Contrast Result Date: 04/15/2024 EXAM: CTA CHEST 04/15/2024 03:56:03 AM TECHNIQUE: CTA of the chest was performed after the administration of intravenous contrast. Multiplanar reformatted images are provided for review. MIP images are provided for review. Automated exposure control, iterative reconstruction, and/or weight based adjustment of the mA/kV was utilized to reduce the radiation dose to as low as reasonably achievable. COMPARISON: CT of the chest dated 04/04/2024. CLINICAL HISTORY: Pulmonary embolism (PE) suspected, high prob. FINDINGS: PULMONARY ARTERIES: Pulmonary arteries are adequately opacified for evaluation. No acute pulmonary embolus. Main pulmonary artery is normal in caliber. MEDIASTINUM: The heart and pericardium demonstrate no acute abnormality. There is no acute abnormality of the thoracic aorta. LYMPH NODES: No mediastinal, hilar or axillary lymphadenopathy. LUNGS AND PLEURA: There are ground-glass and reticular opacities present dependently within the lower lobes bilaterally, likely representing combination of edema and atelectasis. No evidence of pleural effusion or pneumothorax. UPPER ABDOMEN: Limited images of the upper abdomen are unremarkable. SOFT TISSUES AND BONES: A  right internal jugular chest port is present. There is a 13 mm round cystic lesion within the inferior pole of the left lobe of the thyroid. No acute bone abnormality. IMPRESSION: 1. No pulmonary embolism. 2. Ground-glass and reticular opacities in the lower lobes bilaterally, likely representing edema and atelectasis. Electronically signed by: Evalene Coho MD 04/15/2024 04:10 AM EST RP Workstation: [id number]   CT Soft Tissue Neck W Contrast Result Date: 04/15/2024 EXAM: CT NECK WITH CONTRAST 04/15/2024 03:56:03 AM TECHNIQUE: CT of the neck was performed with the administration of 100 mL of iohexol  (OMNIPAQUE ) 350 MG/ML injection. Multiplanar reformatted images are provided for review. Automated exposure control, iterative reconstruction, and/or weight based adjustment of the mA/kV was utilized to reduce the radiation dose to as low as reasonably achievable. COMPARISON: None available. CLINICAL HISTORY: Neck infection. FINDINGS: AERODIGESTIVE TRACT: No discrete mass. No edema. SALIVARY GLANDS: The parotid and submandibular glands are unremarkable. THYROID: There is a 13 mm round cystic lesion present within the inferior pole of the left lobe of the thyroid. LYMPH NODES: No suspicious cervical lymphadenopathy. SOFT TISSUES: There is a right-sided internal jugular chest port present. There is intraluminal  thrombus present within the internal jugular vein above the level of the catheter extending approximately 4.7 cm. No mass or fluid collection. BONES: No abnormality. OTHER: Visualized sinuses and mastoid air cells are well aerated. Visualized lungs are clear. IMPRESSION: 1. Right-sided internal jugular chest port with intraluminal thrombus above the level of the catheter extending approximately 4.7 cm. Electronically signed by: Evalene Coho MD 04/15/2024 04:05 AM EST RP Workstation: [id number]   US  RT BREAST BX W LOC DEV 1ST LESION IMG BX SPEC US  GUIDE Result Date: 04/14/2024 CLINICAL DATA:   38 year old woman with multifocal LEFT breast IDC with LEFT axillary lymph node metastasis presents for biopsy of indeterminate 0.9 cm RIGHT breast mass. EXAM: ULTRASOUND GUIDED RIGHT BREAST CORE NEEDLE BIOPSY COMPARISON:  Previous exam(s). PROCEDURE: I met with the patient and we discussed the procedure of ultrasound-guided biopsy, including benefits and alternatives. We discussed the high likelihood of a successful procedure. We discussed the risks of the procedure, including infection, bleeding, tissue injury, clip migration, and inadequate sampling. Informed written consent was given. The usual time-out protocol was performed immediately prior to the procedure. Lesion quadrant: Upper outer quadrant Using sterile technique and 1% Lidocaine  as local anesthetic, under direct ultrasound visualization, a 14 gauge spring-loaded device was used to perform biopsy of 0.9 cm RIGHT breast mass using a superior approach. At the conclusion of the procedure venus shaped tissue marker clip was deployed into the biopsy cavity. Follow up 2 view mammogram was performed and dictated separately. IMPRESSION: 1. Ultrasound guided biopsy of 0.9 cm RIGHT breast mass (9 o'clock 2 CMFN). No apparent complications. 2. Ultrasound-guided biopsy of RIGHT axillary lymph node with borderline cortical thickening measuring up to 0.4 cm could not be performed due to its close proximity to a large artery. Electronically Signed   By: Aliene Lloyd M.D.   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 EXAM: ULTRASOUND OF THE RIGHT BREAST COMPARISON:  MRI breast 04/04/2024. Diagnostic mammogram 03/19/2024. FINDINGS: Targeted sonographic  evaluation of the RIGHT breast and axilla was performed. 0.9 x 0.4 x 0.8 cm oval circumscribed hypoechoic mass seen at 9 o'clock 2 CMFN corresponds to the mass seen on MRI. RIGHT axillary lymph node with borderline cortical thickening measuring 0.4 cm. IMPRESSION: 1. Indeterminate 0.9 cm RIGHT breast mass (9 o'clock 2 CMFN), corresponding to the mass seen on MRI. 2. Borderline thickening of RIGHT axillary lymph node measuring up to 0.4 cm. RECOMMENDATION: 1. Ultrasound-guided core needle biopsy 0.9 cm RIGHT breast mass (9 o'clock 2 CMFN). 2. Ultrasound-guided core needle biopsy of RIGHT axillary lymph node with cortical thickness measuring 0.4 cm. I have discussed the findings and recommendations with the patient. The biopsy procedure was explained to the patient and questions were answered. Patient expressed their understanding of the biopsy recommendation. Patient will be scheduled for biopsy at her earliest convenience by the schedulers. Ordering provider will be notified. If applicable, a reminder letter will be sent to the patient regarding the next appointment. BI-RADS CATEGORY  4: Suspicious. Electronically Signed   By: Aliene Lloyd M.D.   On: 04/14/2024 12:29   MM CLIP PLACEMENT RIGHT Result Date: 04/14/2024 CLINICAL DATA:  Status post ultrasound-guided biopsy of RIGHT breast mass EXAM: 3D DIAGNOSTIC RIGHT MAMMOGRAM POST ULTRASOUND  BIOPSY COMPARISON:  Previous exam(s). ACR Breast Density Category c: The breasts are heterogeneously dense, which may obscure small masses. FINDINGS: 3D Mammographic images were obtained following ultrasound guided biopsy of RIGHT breast mass. The biopsy marking clip is in expected position at the site of biopsy. IMPRESSION: Appropriate positioning of the Venus shaped biopsy marking clip at the site of biopsy in the anterior depth of the outer RIGHT breast. Final Assessment: Post Procedure Mammograms for Marker Placement Electronically Signed   By: Aliene Lloyd M.D.   On:  04/14/2024 12:25   NM Bone Scan Whole Body Result Date: 04/08/2024 CLINICAL DATA:  Invasive breast cancer.  Staging. EXAM: NUCLEAR MEDICINE WHOLE BODY BONE SCAN TECHNIQUE: Whole body anterior and posterior images were obtained approximately 3 hours after intravenous injection of radiopharmaceutical. RADIOPHARMACEUTICALS:  21.1 mCi Technetium-62m MDP IV COMPARISON:  Chest abdomen pelvis CT 04/04/2024. FINDINGS: No focal radiotracer accumulation within the skeletal anatomy to raise concern for bony metastatic involvement. IMPRESSION: No findings to suggest active skeletal metastases. Electronically Signed   By: Camellia Candle M.D.   On: 04/08/2024 09:10   CT ABDOMEN PELVIS W CONTRAST Result Date: 04/08/2024 CLINICAL DATA:  Breast cancer, invasive, stage I/II/III, initial workup new breast cancer with metastatic lymph nodes prior to starting chemo. * Tracking Code: BO * EXAM: CT CHEST, ABDOMEN, AND PELVIS WITH CONTRAST TECHNIQUE: Multidetector CT imaging of the chest, abdomen and pelvis was performed following the standard protocol during bolus administration of intravenous contrast. RADIATION DOSE REDUCTION: This exam was performed according to the departmental dose-optimization program which includes automated exposure control, adjustment of the mA and/or kV according to patient size and/or use of iterative reconstruction technique. CONTRAST:  OMNIPAQUE  IOHEXOL  300 MG/ML  SOLN COMPARISON:  CT scan chest, abdomen and pelvis from 03/17/2024. FINDINGS: CT CHEST FINDINGS Cardiovascular: Normal cardiac size. No pericardial effusion. No aortic aneurysm. Mediastinum/Nodes: Redemonstration of a 1.0 x 1.2 cm hypoattenuating nodule in the inferior left thyroid lobe, incompletely characterized on the current exam but unchanged since the prior study. The nodule does not meet the size criteria for follow-up ultrasound evaluation. No solid / cystic mediastinal masses. The esophagus is nondistended precluding optimal  assessment. No mediastinal or hilar lymphadenopathy by size criteria. Redemonstration of several enlarged heterogeneous rounded axillary lymph nodes with largest measuring up to 1.2 x 1.8 cm, which previously measured 1.2 x 1.6 cm. Overall no significant interval change over 3 weeks' duration. No right axillary lymphadenopathy by size criteria. Lungs/Pleura: The central tracheo-bronchial tree is patent. No mass or consolidation. No pleural effusion or pneumothorax. No suspicious lung nodules. Musculoskeletal: A CT Port-a-Cath is seen in the right upper chest wall with the catheter terminating in the cavo-atrial junction region. Visualized soft tissues of the chest wall are otherwise grossly unremarkable. No suspicious osseous lesions. CT ABDOMEN PELVIS FINDINGS Hepatobiliary: The liver is normal in size. Non-cirrhotic configuration. No suspicious mass. No intrahepatic or extrahepatic bile duct dilation. No calcified gallstones. Normal gallbladder wall thickness. No pericholecystic inflammatory changes. Pancreas: Unremarkable. No pancreatic ductal dilatation or surrounding inflammatory changes. Spleen: Within normal limits. No focal lesion. Adrenals/Urinary Tract: Adrenal glands are unremarkable. No suspicious renal mass. No hydronephrosis. There is a punctate nonobstructing calculus in the right kidney upper pole. No other renal or ureteric calculi. Unremarkable urinary bladder. Stomach/Bowel: No disproportionate dilation of the small or large bowel loops. No evidence of abnormal bowel wall thickening or inflammatory changes. The appendix is unremarkable. Vascular/Lymphatic: No ascites or pneumoperitoneum. No abdominal or pelvic lymphadenopathy, by  size criteria. No aneurysmal dilation of the major abdominal arteries. Reproductive: Not well evaluated on the CT scan exam. However, having said that note is made of bulky retroverted uterus with intrauterine device. The cervix appears bulky however, not well evaluated  on this exam. Correlate clinically to determine the need for additional imaging. Bilateral adnexa appears within normal limits. Other: There is a tiny fat containing umbilical hernia. Multiple venous collaterals noted in the subcutaneous tissue over the bilateral inguinal regions/lower anterior pelvis, nonspecific but typically seen as a sequela of chronic venous thrombosis of the upstream vein which are unfortunately not well evaluated on this phase of contrast. The soft tissues and abdominal wall are otherwise unremarkable. Musculoskeletal: No suspicious osseous lesions. IMPRESSION: 1. Redemonstration of multiple enlarged left axillary lymph nodes, concerning for metastases. No right axillary lymphadenopathy by size criteria. No mediastinal or hilar lymphadenopathy. No abdominal or pelvic lymphadenopathy by size criteria. 2. No other metastatic disease identified within the chest, abdomen or pelvis. 3. Multiple other nonacute observations, as described above. Electronically Signed   By: Ree Molt M.D.   On: 04/08/2024 08:35   CT Chest W Contrast Result Date: 04/08/2024 CLINICAL DATA:  Breast cancer, invasive, stage I/II/III, initial workup new breast cancer with metastatic lymph nodes prior to starting chemo. * Tracking Code: BO * EXAM: CT CHEST, ABDOMEN, AND PELVIS WITH CONTRAST TECHNIQUE: Multidetector CT imaging of the chest, abdomen and pelvis was performed following the standard protocol during bolus administration of intravenous contrast. RADIATION DOSE REDUCTION: This exam was performed according to the departmental dose-optimization program which includes automated exposure control, adjustment of the mA and/or kV according to patient size and/or use of iterative reconstruction technique. CONTRAST:  OMNIPAQUE  IOHEXOL  300 MG/ML  SOLN COMPARISON:  CT scan chest, abdomen and pelvis from 03/17/2024. FINDINGS: CT CHEST FINDINGS Cardiovascular: Normal cardiac size. No pericardial effusion. No  aortic aneurysm. Mediastinum/Nodes: Redemonstration of a 1.0 x 1.2 cm hypoattenuating nodule in the inferior left thyroid lobe, incompletely characterized on the current exam but unchanged since the prior study. The nodule does not meet the size criteria for follow-up ultrasound evaluation. No solid / cystic mediastinal masses. The esophagus is nondistended precluding optimal assessment. No mediastinal or hilar lymphadenopathy by size criteria. Redemonstration of several enlarged heterogeneous rounded axillary lymph nodes with largest measuring up to 1.2 x 1.8 cm, which previously measured 1.2 x 1.6 cm. Overall no significant interval change over 3 weeks' duration. No right axillary lymphadenopathy by size criteria. Lungs/Pleura: The central tracheo-bronchial tree is patent. No mass or consolidation. No pleural effusion or pneumothorax. No suspicious lung nodules. Musculoskeletal: A CT Port-a-Cath is seen in the right upper chest wall with the catheter terminating in the cavo-atrial junction region. Visualized soft tissues of the chest wall are otherwise grossly unremarkable. No suspicious osseous lesions. CT ABDOMEN PELVIS FINDINGS Hepatobiliary: The liver is normal in size. Non-cirrhotic configuration. No suspicious mass. No intrahepatic or extrahepatic bile duct dilation. No calcified gallstones. Normal gallbladder wall thickness. No pericholecystic inflammatory changes. Pancreas: Unremarkable. No pancreatic ductal dilatation or surrounding inflammatory changes. Spleen: Within normal limits. No focal lesion. Adrenals/Urinary Tract: Adrenal glands are unremarkable. No suspicious renal mass. No hydronephrosis. There is a punctate nonobstructing calculus in the right kidney upper pole. No other renal or ureteric calculi. Unremarkable urinary bladder. Stomach/Bowel: No disproportionate dilation of the small or large bowel loops. No evidence of abnormal bowel wall thickening or inflammatory changes. The appendix is  unremarkable. Vascular/Lymphatic: No ascites or pneumoperitoneum. No abdominal or  pelvic lymphadenopathy, by size criteria. No aneurysmal dilation of the major abdominal arteries. Reproductive: Not well evaluated on the CT scan exam. However, having said that note is made of bulky retroverted uterus with intrauterine device. The cervix appears bulky however, not well evaluated on this exam. Correlate clinically to determine the need for additional imaging. Bilateral adnexa appears within normal limits. Other: There is a tiny fat containing umbilical hernia. Multiple venous collaterals noted in the subcutaneous tissue over the bilateral inguinal regions/lower anterior pelvis, nonspecific but typically seen as a sequela of chronic venous thrombosis of the upstream vein which are unfortunately not well evaluated on this phase of contrast. The soft tissues and abdominal wall are otherwise unremarkable. Musculoskeletal: No suspicious osseous lesions. IMPRESSION: 1. Redemonstration of multiple enlarged left axillary lymph nodes, concerning for metastases. No right axillary lymphadenopathy by size criteria. No mediastinal or hilar lymphadenopathy. No abdominal or pelvic lymphadenopathy by size criteria. 2. No other metastatic disease identified within the chest, abdomen or pelvis. 3. Multiple other nonacute observations, as described above. Electronically Signed   By: Ree Molt M.D.   On: 04/08/2024 08:35   MR BREAST BILATERAL W WO CONTRAST INC CAD Result Date: 04/04/2024 CLINICAL DATA:  Recent diagnosis of multifocal left breast malignancy and a metastatic left axillary lymph node. Assess extent of disease. EXAM: BILATERAL BREAST MRI WITH AND WITHOUT CONTRAST TECHNIQUE: Multiplanar, multisequence MR images of both breasts were obtained prior to and following the intravenous administration of 8 ml of Gadavist  Three-dimensional MR images were rendered by post-processing of the original MR data on an independent  workstation. The three-dimensional MR images were interpreted, and findings are reported in the following complete MRI report for this study. Three dimensional images were evaluated at the independent interpreting workstation using the DynaCAD thin client. COMPARISON:  Diagnostic mammography and follow-up biopsy exams from 03/19/2024. FINDINGS: Breast composition: c. Heterogeneous fibroglandular tissue. Background parenchymal enhancement: Mild Right breast: There is an oval, circumscribed enhancing mass in the right breast measuring 9 mm in long axis, a portion of which demonstrates washout kinetics. This lies in the 7 o'clock retroareolar location, best appreciated on image 58, series 10. There are no other right breast masses or areas of abnormal enhancement. Left breast: Large irregular mass lies in the upper outer breast measuring approximately 4.4 x 2.2 x 3 cm, associated with the ribbon shaped post biopsy marker clip, which is best appreciated on image 70, series 3. Inferior to this mass is a second smaller mass measuring 1.3 cm, associated with artifact from the coil shaped post biopsy marker clip, best appreciated on image 51, series 10. There is a small focus of enhancement associated with the heart shaped post biopsy marker clip, image 67, series 10, reflecting the biopsy proven fibroadenoma. There is low level enhancement that extends from the dominant mass to the skin surface of the anterior upper outer breast, best appreciated on image 73, series 10. This may reflect post biopsy change, which is suspected, with malignancy not excluded. There is significant skin thickening with associated increased T2 signal, but only minimal diffuse enhancement. This is consistent with reactive edema. There are no other left breast masses or areas of abnormal enhancement. Lymph nodes: No abnormal appearing left axillary lymph nodes. The biopsy proven metastatic left axillary lymph node is not included in the field of  view. There are several adjacent prominent level 2 right axillary lymph nodes with cortical thickness is up to 4 mm. Ancillary findings:  None. IMPRESSION: 1. Biopsy-proven  multifocal left breast malignancy. The dominant mass, associated with the ribbon shaped post biopsy marker clip, measures 4.4 x 2.2 x 3 cm. The second biopsy-proven carcinoma lies inferior to the dominant mass, associated with the coil shaped post biopsy marker clip, measuring 1.3 cm. 2. Low level abnormal enhancement extends from the dominant mass to the anterior, upper outer quadrant skin surface. This may reflect post biopsy reactive enhancement, but additional malignancy is not excluded. 3. More diffuse left breast skin thickening with associated increased T2 signal, but only minimal enhancement, is suspected to be reactive edema. 4. No other evidence of left breast malignancy. 5. Indeterminate enhancing 9 mm mass in the right breast, anterior lower outer quadrant, image 58, series 10. Tissue sampling is recommended. 6. Prominent right axillary lymph nodes. RECOMMENDATION: 1. Targeted ultrasound of the right breast to attempt to localize the 9 mm mass in the anterior, lower outer quadrant. If this mass can be visualized sonographically, than ultrasound-guided core needle biopsy would be indicated. If this mass cannot be visualized sonographically, then MRI guided core needle biopsy would be indicated. 2. Recommend right axilla ultrasound as well to assess the prominent lymph node seen on MRI. BI-RADS CATEGORY  4: Suspicious. Electronically Signed   By: Alm Parkins M.D.   On: 04/04/2024 09:46   ECHOCARDIOGRAM COMPLETE Result Date: 04/03/2024    ECHOCARDIOGRAM REPORT   Patient Name:   NERI VIEYRA Date of Exam: 04/03/2024 Medical Rec #:  [id number]        Height:       71.0 in Accession #:    [id number]       Weight:       177.9 lb Date of Birth:  May 28, 1985       BSA:          2.006 m Patient Age:    38 years         BP:            146/90 mmHg Patient Gender: F                HR:           63 bpm. Exam Location:  Outpatient Procedure: 2D Echo, 3D Echo, Cardiac Doppler, Color Doppler and Strain Analysis            (Both Spectral and Color Flow Doppler were utilized during            procedure). Indications:    Z51.11 Encounter for antineoplastic chemotheraphy  History:        Patient has prior history of Echocardiogram examinations, most                 recent 08/31/2021. Risk Factors:Current Smoker. Breast cancer.  Sonographer:    Ellouise Mose RDCS Referring Phys: [id number] ONITA MATTOCK IMPRESSIONS  1. Left ventricular ejection fraction, by estimation, is 60 to 65%. Left ventricular ejection fraction by 3D volume is 56 %. The left ventricle has normal function. The left ventricle has no regional wall motion abnormalities. Left ventricular diastolic  parameters were normal. The average left ventricular global longitudinal strain is -19.0 %.  2. Right ventricular systolic function is normal. The right ventricular size is normal. There is normal pulmonary artery systolic pressure. The estimated right ventricular systolic pressure is 21.7 mmHg.  3. The mitral valve is normal in structure. Trivial mitral valve regurgitation. No evidence of mitral stenosis.  4. Tricuspid valve regurgitation is mild to moderate.  5. The aortic valve is  tricuspid. Aortic valve regurgitation is not visualized. No aortic stenosis is present.  6. The inferior vena cava is dilated in size with >50% respiratory variability, suggesting right atrial pressure of 8 mmHg. FINDINGS  Left Ventricle: Left ventricular ejection fraction, by estimation, is 60 to 65%. Left ventricular ejection fraction by 3D volume is 56 %. The left ventricle has normal function. The left ventricle has no regional wall motion abnormalities. The average left ventricular global longitudinal strain is -19.0 %. The left ventricular internal cavity size was normal in size. There is no left ventricular hypertrophy.  Left ventricular diastolic parameters were normal. Right Ventricle: The right ventricular size is normal. No increase in right ventricular wall thickness. Right ventricular systolic function is normal. There is normal pulmonary artery systolic pressure. The tricuspid regurgitant velocity is 1.85 m/s, and  with an assumed right atrial pressure of 8 mmHg, the estimated right ventricular systolic pressure is 21.7 mmHg. Left Atrium: Left atrial size was normal in size. Right Atrium: Right atrial size was normal in size. Pericardium: There is no evidence of pericardial effusion. Mitral Valve: The mitral valve is normal in structure. Trivial mitral valve regurgitation. No evidence of mitral valve stenosis. Tricuspid Valve: The tricuspid valve is normal in structure. Tricuspid valve regurgitation is mild to moderate. No evidence of tricuspid stenosis. Aortic Valve: The aortic valve is tricuspid. Aortic valve regurgitation is not visualized. No aortic stenosis is present. Pulmonic Valve: The pulmonic valve was normal in structure. Pulmonic valve regurgitation is trivial. No evidence of pulmonic stenosis. Aorta: The aortic root is normal in size and structure. Venous: The inferior vena cava is dilated in size with greater than 50% respiratory variability, suggesting right atrial pressure of 8 mmHg. IAS/Shunts: There is redundancy of the interatrial septum. No atrial level shunt detected by color flow Doppler. Additional Comments: 3D was performed not requiring image post processing on an independent workstation and was normal.  LEFT VENTRICLE PLAX 2D LVIDd:         4.90 cm         Diastology LVIDs:         3.15 cm         LV e' medial:    8.70 cm/s LV PW:         1.00 cm         LV E/e' medial:  7.7 LV IVS:        0.90 cm         LV e' lateral:   15.40 cm/s LVOT diam:     2.30 cm         LV E/e' lateral: 4.4 LV SV:         95 LV SV Index:   47              2D Longitudinal LVOT Area:     4.15 cm        Strain                                 2D Strain GLS   -19.0 %                                Avg: LV Volumes (MOD) LV vol d, MOD    112.5 ml      3D Volume EF A2C:  LV 3D EF:    Left LV vol d, MOD    97.3 ml                    ventricul A4C:                                        ar LV vol s, MOD    45.8 ml                    ejection A2C:                                        fraction LV vol s, MOD    45.0 ml                    by 3D A4C:                                        volume is LV SV MOD A2C:   66.7 ml                    56 %. LV SV MOD A4C:   97.3 ml LV SV MOD BP:    58.7 ml                                3D Volume EF:                                3D EF:        56 %                                LV EDV:       168 ml                                LV ESV:       73 ml                                LV SV:        95 ml RIGHT VENTRICLE             IVC RV S prime:     13.80 cm/s  IVC diam: 2.60 cm TAPSE (M-mode): 1.5 cm                             PULMONARY VEINS                             Diastolic Velocity: 42.90 cm/s                             S/D Velocity:       1.50  Systolic Velocity:  65.00 cm/s LEFT ATRIUM             Index        RIGHT ATRIUM           Index LA diam:        3.25 cm 1.62 cm/m   RA Area:     12.40 cm LA Vol (A2C):   43.8 ml 21.83 ml/m  RA Volume:   31.30 ml  15.60 ml/m LA Vol (A4C):   38.7 ml 19.29 ml/m LA Biplane Vol: 41.3 ml 20.59 ml/m  AORTIC VALVE LVOT Vmax:   113.00 cm/s LVOT Vmean:  73.200 cm/s LVOT VTI:    0.228 m  AORTA Ao Root diam: 3.10 cm Ao Asc diam:  3.30 cm MITRAL VALVE               TRICUSPID VALVE MV Area (PHT): 2.76 cm    TR Peak grad:   13.7 mmHg MV Decel Time: 275 msec    TR Vmax:        185.00 cm/s MV E velocity: 67.40 cm/s MV A velocity: 45.60 cm/s  SHUNTS MV E/A ratio:  1.48        Systemic VTI:  0.23 m                            Systemic Diam: 2.30 cm [name] [name] MD Electronically signed by [name] Balding MD Signature  Date/Time: 04/03/2024/1:44:38 PM    Final    DG Chest 2 View Result Date: 04/03/2024 CLINICAL DATA:  Mid chest pain after port placed 3 days ago. EXAM: CHEST - 2 VIEW COMPARISON:  03/31/2024 FINDINGS: Right IJ Port-A-Cath has tip at the level of the cavoatrial junction. Lungs are adequately inflated without acute airspace process or effusion. Cardiomediastinal silhouette and remainder the exam is unchanged. IMPRESSION: 1. No acute cardiopulmonary disease. 2. Right IJ Port-A-Cath with tip at the level of the cavoatrial junction. Electronically Signed   By: Toribio Agreste M.D.   On: 04/03/2024 12:00   DG CHEST PORT 1 VIEW Result Date: 03/31/2024 EXAM: 1 VIEW(S) XRAY OF THE CHEST 03/31/2024 09:10:00 AM COMPARISON: 03/17/2024 CLINICAL HISTORY: 38 year old female with left breast cancer. Port-A-Cath placed. FINDINGS: LINES, TUBES AND DEVICES: Right IJ Port-A-Cath placement. Port-A-Cath tip at the cavoatrial junction level. LUNGS AND PLEURA: No focal pulmonary opacity. No pleural effusion. No pneumothorax. HEART AND MEDIASTINUM: No acute abnormality of the cardiac and mediastinal silhouettes. BONES AND SOFT TISSUES: No acute osseous abnormality. IMPRESSION: 1. Right internal jugular Port-A-Cath with tip at the cavoatrial junction level. 2. No acute cardiopulmonary abnormality. Electronically signed by: Helayne Hurst MD 03/31/2024 09:29 AM EST RP Workstation: [id number]   DG C-Arm 1-60 Min-No Report Result Date: 03/31/2024 Fluoroscopy was utilized by the requesting physician.  No radiographic interpretation.   US  LT BREAST BX W LOC DEV 1ST LESION IMG BX SPEC US  GUIDE Addendum Date: 03/20/2024 ADDENDUM REPORT: 03/20/2024 11:21 ADDENDUM: Pathology revealed: Site 1. 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 LYMPHOVASCULAR INVASION: NOT IDENTIFIED. CANCER LENGTH: 11 MM / 1.1 CM. CALCIFICATIONS: NOT IDENTIFIED. Site 2. Breast, left 4 o'clock (coil clip) INTRAMAMMARY LYMPH NODE,  INVOLVED BY INVASIVE DUCTAL CARCINOMA (1/1). CANCER LENGTH: 6 MM / 0.6 CM. Site 3. 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 NEGATIVE FOR ATYPIA OR MALIGNANCY. Site 4. 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 METASTATIC FOCUS: 5  MM /0.5 CM. All sites were found to be concordant by Dr. Madeleine Satterfield. RECOMMENDATION: 1. Breast MRI to evaluate extent of disease. 2. Surgical and oncological consultation. The patient was referred to [facility] with appointment on Dec. 10, 2025. Pathology results were discussed with the patient by telephone. The patient reported doing well after the biopsy with tenderness at the site. Post biopsy instructions and care were reviewed and questions were answered. The patient was encouraged to call [facility] for any additional concerns. Addendum dictated by Conard Billing R.T. (R)(M) Electronically Signed   By: Debby Satterfield M.D.   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 Dominant 3.5 cm mass with calcifications at 2 o'clock. Satellite 1.3 cm mass at 4 o'clock. Adjacent satellite masses at 12 o'clock, the largest measuring 1.1 cm. At least 2 abnormal LEFT axillary lymph nodes. EXAM: ULTRASOUND GUIDED LEFT BREAST CORE NEEDLE BIOPSY x 3 ULTRASOUND GUIDED LEFT AXILLARY LYMPH NODE CORE NEEDLE BIOPSY COMPARISON:  Previous exam(s). PROCEDURE: I met with the patient and we discussed the procedure of ultrasound-guided biopsy, including benefits and alternatives. We discussed the high likelihood of a successful procedure. We discussed the risks of the procedure, including infection, bleeding, tissue injury, clip migration, and inadequate sampling. Informed written consent was given. The usual time-out  protocol was performed immediately prior to the procedure. #1) Mass, 2 o'clock, lesion quadrant: UPPER OUTER QUADRANT. Using sterile technique with chlorhexidine  as skin antisepsis, 1% lidocaine  and 1% lidocaine  with epinephrine  as local anesthetic, under direct ultrasound visualization, a 12 gauge Bard Marquee core needle device placed through an 11 gauge introducer needle was used to perform biopsy of the mass at 2 o'clock using a lateral approach. At the conclusion of the procedure, a ribbon shaped tissue marker clip was deployed into the biopsy cavity. # 2) Mass, 4 o'clock, lesion quadrant: LOWER OUTER QUADRANT. Using sterile technique with chlorhexidine  as skin antisepsis, 1% lidocaine  and 1% lidocaine  with epinephrine  as local anesthetic, under direct ultrasound visualization, a 14 gauge Bard Marquee core needle device placed through a 13 gauge introducer needle was used perform biopsy of the mass at 4 o'clock using a lateral approach. At the conclusion of the procedure, a coil shaped tissue marker clip was deployed into the biopsy cavity. # 3) Mass, 12 o'clock location: Using sterile technique with chlorhexidine  as skin antisepsis, 1% lidocaine  and 1% lidocaine  with epinephrine  as local anesthetic, under direct ultrasound visualization, a 14 gauge Bard Marquee core needle device placed through a 13 gauge introducer needle was used perform biopsy of the mass at 12 o'clock using a lateral approach. At the conclusion of the procedure, a heart shaped tissue marker clip was deployed into the biopsy cavity. # 4) Axillary lymph node: Using sterile technique with chlorhexidine  as skin antisepsis, 1% lidocaine  and 1% lidocaine  with epinephrine  as local anesthetic, under direct ultrasound visualization, a 14 gauge Bard Marquee core needle device placed through a 13 gauge introducer needle was used perform biopsy of the abnormal lymph node using an inferolateral approach. At the conclusion of the procedure, a Venus  shaped tissue marker clip was deployed into the biopsy cavity. The patient tolerated the procedures well without apparent immediate complications. Follow up 2 view mammogram was performed in order to confirm clip placement and was dictated separately. IMPRESSION: 1. Ultrasound-guided core needle biopsy  of a dominant mass in the UPPER OUTER QUADRANT of the LEFT breast at 2 o'clock. 2. Ultrasound-guided core needle biopsy of a satellite mass in the LOWER OUTER QUADRANT of the LEFT breast at 4 o'clock. 3. Ultrasound-guided core needle biopsy of a satellite mass in the upper retroareolar LEFT breast at 12 o'clock. 4. Ultrasound-guided core needle biopsy of an abnormal LEFT axillary lymph node. Electronically Signed: By: Debby Satterfield M.D. On: 03/19/2024 17:17   US  LT BREAST BX W LOC DEV EA ADD LESION IMG BX SPEC US  GUIDE Addendum Date: 03/20/2024 ADDENDUM REPORT: 03/20/2024 11:21 ADDENDUM: Pathology revealed: Site 1. 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 LYMPHOVASCULAR INVASION: NOT IDENTIFIED. CANCER LENGTH: 11 MM / 1.1 CM. CALCIFICATIONS: NOT IDENTIFIED. Site 2. Breast, left 4 o'clock (coil clip) INTRAMAMMARY LYMPH NODE, INVOLVED BY INVASIVE DUCTAL CARCINOMA (1/1). CANCER LENGTH: 6 MM / 0.6 CM. Site 3. 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 NEGATIVE FOR ATYPIA OR MALIGNANCY. Site 4. 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 METASTATIC FOCUS: 5 MM /0.5 CM. All sites were found to be concordant by Dr. Madeleine Satterfield. RECOMMENDATION: 1. Breast MRI to evaluate extent of disease. 2. Surgical and oncological consultation. The patient was referred to [facility] with appointment on Dec. 10, 2025. Pathology results were discussed with the patient by telephone. The patient reported doing well after the biopsy with tenderness at the site.  Post biopsy instructions and care were reviewed and questions were answered. The patient was encouraged to call [facility] for any additional concerns. Addendum dictated by Conard Billing R.T. (R)(M) Electronically Signed   By: Debby Satterfield M.D.   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 Dominant 3.5 cm mass with calcifications at 2 o'clock. Satellite 1.3 cm mass at 4 o'clock. Adjacent satellite masses at 12 o'clock, the largest measuring 1.1 cm. At least 2 abnormal LEFT axillary lymph nodes. EXAM: ULTRASOUND GUIDED LEFT BREAST CORE NEEDLE BIOPSY x 3 ULTRASOUND GUIDED LEFT AXILLARY LYMPH NODE CORE NEEDLE BIOPSY COMPARISON:  Previous exam(s). PROCEDURE: I met with the patient and we discussed the procedure of ultrasound-guided biopsy, including benefits and alternatives. We discussed the high likelihood of a successful procedure. We discussed the risks of the procedure, including infection, bleeding, tissue injury, clip migration, and inadequate sampling. Informed written consent was given. The usual time-out protocol was performed immediately prior to the procedure. #1) Mass, 2 o'clock, lesion quadrant: UPPER OUTER QUADRANT. Using sterile technique with chlorhexidine  as skin antisepsis, 1% lidocaine  and 1% lidocaine  with epinephrine  as local anesthetic, under direct ultrasound visualization, a 12 gauge Bard Marquee core needle device placed through an 11 gauge introducer needle was used to perform biopsy of the mass at 2 o'clock using a lateral approach. At the conclusion of the procedure, a ribbon shaped tissue marker clip was deployed into the biopsy cavity. # 2) Mass, 4 o'clock, lesion quadrant: LOWER OUTER QUADRANT. Using sterile technique with chlorhexidine  as skin antisepsis, 1% lidocaine  and 1% lidocaine  with epinephrine  as local anesthetic, under direct  ultrasound visualization, a 14 gauge Bard Marquee core needle device placed through a 13 gauge introducer needle was used perform biopsy of the mass at 4 o'clock using a lateral approach. At the conclusion of the procedure, a coil shaped tissue  marker clip was deployed into the biopsy cavity. # 3) Mass, 12 o'clock location: Using sterile technique with chlorhexidine  as skin antisepsis, 1% lidocaine  and 1% lidocaine  with epinephrine  as local anesthetic, under direct ultrasound visualization, a 14 gauge Bard Marquee core needle device placed through a 13 gauge introducer needle was used perform biopsy of the mass at 12 o'clock using a lateral approach. At the conclusion of the procedure, a heart shaped tissue marker clip was deployed into the biopsy cavity. # 4) Axillary lymph node: Using sterile technique with chlorhexidine  as skin antisepsis, 1% lidocaine  and 1% lidocaine  with epinephrine  as local anesthetic, under direct ultrasound visualization, a 14 gauge Bard Marquee core needle device placed through a 13 gauge introducer needle was used perform biopsy of the abnormal lymph node using an inferolateral approach. At the conclusion of the procedure, a Venus shaped tissue marker clip was deployed into the biopsy cavity. The patient tolerated the procedures well without apparent immediate complications. Follow up 2 view mammogram was performed in order to confirm clip placement and was dictated separately. IMPRESSION: 1. Ultrasound-guided core needle biopsy of a dominant mass in the UPPER OUTER QUADRANT of the LEFT breast at 2 o'clock. 2. Ultrasound-guided core needle biopsy of a satellite mass in the LOWER OUTER QUADRANT of the LEFT breast at 4 o'clock. 3. Ultrasound-guided core needle biopsy of a satellite mass in the upper retroareolar LEFT breast at 12 o'clock. 4. Ultrasound-guided core needle biopsy of an abnormal LEFT axillary lymph node. Electronically Signed: By: Debby Satterfield M.D. On: 03/19/2024 17:17    US  LT BREAST BX W LOC DEV EA ADD LESION IMG BX SPEC US  GUIDE Addendum Date: 03/20/2024 ADDENDUM REPORT: 03/20/2024 11:21 ADDENDUM: Pathology revealed: Site 1. 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 LYMPHOVASCULAR INVASION: NOT IDENTIFIED. CANCER LENGTH: 11 MM / 1.1 CM. CALCIFICATIONS: NOT IDENTIFIED. Site 2. Breast, left 4 o'clock (coil clip) INTRAMAMMARY LYMPH NODE, INVOLVED BY INVASIVE DUCTAL CARCINOMA (1/1). CANCER LENGTH: 6 MM / 0.6 CM. Site 3. 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 NEGATIVE FOR ATYPIA OR MALIGNANCY. Site 4. 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 METASTATIC FOCUS: 5 MM /0.5 CM. All sites were found to be concordant by Dr. Madeleine Satterfield. RECOMMENDATION: 1. Breast MRI to evaluate extent of disease. 2. Surgical and oncological consultation. The patient was referred to [facility] with appointment on Dec. 10, 2025. Pathology results were discussed with the patient by telephone. The patient reported doing well after the biopsy with tenderness at the site. Post biopsy instructions and care were reviewed and questions were answered. The patient was encouraged to call [facility] for any additional concerns. Addendum dictated by Conard Billing R.T. (R)(M) Electronically Signed   By: Debby Satterfield M.D.   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 Dominant 3.5 cm mass with calcifications at 2 o'clock. Satellite 1.3 cm mass at 4 o'clock. Adjacent satellite masses at 12 o'clock, the largest measuring 1.1 cm. At least 2 abnormal LEFT axillary lymph nodes. EXAM: ULTRASOUND GUIDED LEFT BREAST CORE NEEDLE BIOPSY x 3 ULTRASOUND GUIDED LEFT AXILLARY LYMPH NODE CORE NEEDLE  BIOPSY COMPARISON:  Previous exam(s). PROCEDURE: I met with the patient and we discussed the procedure of ultrasound-guided biopsy, including benefits and alternatives. We discussed the  high likelihood of a successful procedure. We discussed the risks of the procedure, including infection, bleeding, tissue injury, clip migration, and inadequate sampling. Informed written consent was given. The usual time-out protocol was performed immediately prior to the procedure. #1) Mass, 2 o'clock, lesion quadrant: UPPER OUTER QUADRANT. Using sterile technique with chlorhexidine  as skin antisepsis, 1% lidocaine  and 1% lidocaine  with epinephrine  as local anesthetic, under direct ultrasound visualization, a 12 gauge Bard Marquee core needle device placed through an 11 gauge introducer needle was used to perform biopsy of the mass at 2 o'clock using a lateral approach. At the conclusion of the procedure, a ribbon shaped tissue marker clip was deployed into the biopsy cavity. # 2) Mass, 4 o'clock, lesion quadrant: LOWER OUTER QUADRANT. Using sterile technique with chlorhexidine  as skin antisepsis, 1% lidocaine  and 1% lidocaine  with epinephrine  as local anesthetic, under direct ultrasound visualization, a 14 gauge Bard Marquee core needle device placed through a 13 gauge introducer needle was used perform biopsy of the mass at 4 o'clock using a lateral approach. At the conclusion of the procedure, a coil shaped tissue marker clip was deployed into the biopsy cavity. # 3) Mass, 12 o'clock location: Using sterile technique with chlorhexidine  as skin antisepsis, 1% lidocaine  and 1% lidocaine  with epinephrine  as local anesthetic, under direct ultrasound visualization, a 14 gauge Bard Marquee core needle device placed through a 13 gauge introducer needle was used perform biopsy of the mass at 12 o'clock using a lateral approach. At the conclusion of the procedure, a heart shaped tissue marker clip was deployed into the biopsy cavity. #  4) Axillary lymph node: Using sterile technique with chlorhexidine  as skin antisepsis, 1% lidocaine  and 1% lidocaine  with epinephrine  as local anesthetic, under direct ultrasound visualization, a 14 gauge Bard Marquee core needle device placed through a 13 gauge introducer needle was used perform biopsy of the abnormal lymph node using an inferolateral approach. At the conclusion of the procedure, a Venus shaped tissue marker clip was deployed into the biopsy cavity. The patient tolerated the procedures well without apparent immediate complications. Follow up 2 view mammogram was performed in order to confirm clip placement and was dictated separately. IMPRESSION: 1. Ultrasound-guided core needle biopsy of a dominant mass in the UPPER OUTER QUADRANT of the LEFT breast at 2 o'clock. 2. Ultrasound-guided core needle biopsy of a satellite mass in the LOWER OUTER QUADRANT of the LEFT breast at 4 o'clock. 3. Ultrasound-guided core needle biopsy of a satellite mass in the upper retroareolar LEFT breast at 12 o'clock. 4. Ultrasound-guided core needle biopsy of an abnormal LEFT axillary lymph node. Electronically Signed: By: Debby Satterfield M.D. On: 03/19/2024 17:17   US  AXILLARY NODE CORE BIOPSY LEFT Addendum Date: 03/20/2024 ADDENDUM REPORT: 03/20/2024 11:21 ADDENDUM: Pathology revealed: Site 1. 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 LYMPHOVASCULAR INVASION: NOT IDENTIFIED. CANCER LENGTH: 11 MM / 1.1 CM. CALCIFICATIONS: NOT IDENTIFIED. Site 2. Breast, left 4 o'clock (coil clip) INTRAMAMMARY LYMPH NODE, INVOLVED BY INVASIVE DUCTAL CARCINOMA (1/1). CANCER LENGTH: 6 MM / 0.6 CM. Site 3. 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

## 2024-04-16 NOTE — Progress Notes (Signed)
 PHARMACY - ANTICOAGULATION CONSULT NOTE  Pharmacy Consult for Heparin  Indication: RIJ intraluminal thrombus  Vital Signs: Temp: 98.3 F (36.8 C) (12/31 0320) Temp Source: Oral (12/31 0320) BP: 138/96 (12/31 0320) Pulse Rate: 76 (12/31 0320)  Labs: Recent Labs    04/15/24 0237 04/15/24 1137 04/15/24 1315 04/15/24 1900 04/16/24 0508  HGB 12.5  --  12.2  --   --   HCT 36.7  --  36.2  --   --   PLT 87*  --  78*  --   --   APTT  --   --  95*  --   --   HEPARINUNFRC  --  0.39  --  0.23* 0.46  CREATININE 0.75  --   --   --   --     Estimated Creatinine Clearance: 112.9 mL/min (by C-G formula based on SCr of 0.75 mg/dL).  Assessment: 38 y.o. female presented to th e ED with foot pain and pain around her port. Found to have have an intraluminal thrombus and started on IV heparin . Pt is on apixaban  PTA. Of note, pt is thrombocytopenic with platelets of 87>>78 (previously normal).  04/16/2024: Heparin  level =0.46 (therapeutic) on IV heparin  1600 units/hr CBC: in process  No bleeding or infusion related concerns noted  Goal of Therapy:  Heparin  level 0.3-0.7 units/ml Monitor platelets by anticoagulation protocol: Yes   Plan:  Continue heparin  gtt @ 1600 units/hr Recheck heparin  level in 6h to confirm therapeutic dose Daily heparin  level and CBC  Arvin Gauss, PharmD 04/16/2024 5:48 AM

## 2024-04-16 NOTE — Progress Notes (Signed)
 Patient ID: Monica Holland, female   DOB: November 13, 1985, 38 y.o.   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 Port was placed by [facility] on 03/31/24. Per Dr. Philip, no role for IR/endovascular intervention in this case. Would cont with anticoagulation (currently on IV heparin  ) for now along with empiric antibiotics; if no improvement and there is clinical concern for infected port then consult surgery for port removal.

## 2024-04-17 ENCOUNTER — Encounter: Payer: Self-pay | Admitting: Hematology

## 2024-04-17 DIAGNOSIS — M79672 Pain in left foot: Secondary | ICD-10-CM | POA: Diagnosis not present

## 2024-04-17 DIAGNOSIS — M79671 Pain in right foot: Secondary | ICD-10-CM | POA: Diagnosis not present

## 2024-04-17 DIAGNOSIS — M542 Cervicalgia: Secondary | ICD-10-CM | POA: Diagnosis not present

## 2024-04-17 DIAGNOSIS — I82C11 Acute embolism and thrombosis of right internal jugular vein: Secondary | ICD-10-CM | POA: Diagnosis not present

## 2024-04-17 DIAGNOSIS — D696 Thrombocytopenia, unspecified: Secondary | ICD-10-CM | POA: Diagnosis not present

## 2024-04-17 LAB — CBC
HCT: 38 % (ref 36.0–46.0)
Hemoglobin: 12.8 g/dL (ref 12.0–15.0)
MCH: 28.2 pg (ref 26.0–34.0)
MCHC: 33.7 g/dL (ref 30.0–36.0)
MCV: 83.7 fL (ref 80.0–100.0)
Platelets: 99 K/uL — ABNORMAL LOW (ref 150–400)
RBC: 4.54 MIL/uL (ref 3.87–5.11)
RDW: 14.3 % (ref 11.5–15.5)
WBC: 5.9 K/uL (ref 4.0–10.5)
nRBC: 0 % (ref 0.0–0.2)

## 2024-04-17 LAB — HEPARIN LEVEL (UNFRACTIONATED): Heparin Unfractionated: 0.75 [IU]/mL — ABNORMAL HIGH (ref 0.30–0.70)

## 2024-04-17 MED ORDER — GABAPENTIN 100 MG PO CAPS
200.0000 mg | ORAL_CAPSULE | Freq: Two times a day (BID) | ORAL | Status: DC
  Administered 2024-04-17 – 2024-04-18 (×3): 200 mg via ORAL
  Filled 2024-04-17 (×3): qty 2

## 2024-04-17 MED ORDER — DABIGATRAN ETEXILATE MESYLATE 150 MG PO CAPS
150.0000 mg | ORAL_CAPSULE | Freq: Two times a day (BID) | ORAL | Status: DC
  Filled 2024-04-17: qty 1

## 2024-04-17 MED ORDER — DABIGATRAN ETEXILATE MESYLATE 150 MG PO CAPS: 150.0000 mg | ORAL_CAPSULE | Freq: Two times a day (BID) | ORAL | Status: DC

## 2024-04-17 MED ORDER — ENOXAPARIN SODIUM 100 MG/ML IJ SOSY
1.0000 mg/kg | PREFILLED_SYRINGE | Freq: Two times a day (BID) | INTRAMUSCULAR | Status: DC
  Administered 2024-04-17 – 2024-04-18 (×3): 85 mg via SUBCUTANEOUS
  Filled 2024-04-17 (×3): qty 1

## 2024-04-17 NOTE — Progress Notes (Signed)
 " PROGRESS NOTE    Monica Holland  FMW:[id number] DOB: January 23, 1986 DOA: 04/15/2024 PCP: [facility]  Brief Narrative: 39 year old with a history of PE and DVT on Eliquis , tricuspid valve regurgitation, and breast cancer status post port placement 03/31/2024 (per Dr. Curvin) with recent initiation of chemotherapy approximately 1 week prior who [facility] with the acute onset of severe pain around her port associated with swelling of the neck with tenderness to palpation over the right neck.  On exam she was found to have redness and swelling at the right IJ port.  CTa revealed no evidence of PE but did confirm a right IJ chest port with intraluminal thrombus above the level of the catheter extending approximately 4.7 cm.  This finding was discussed with general surgery who recommended vascular surgery consultation.  Vascular surgery was called and they recommended IR consultation.  IR was called and they recommended IV heparin , empiric antibiotics, and admission for observation with possible need to replace the port if it is no longer functional.  ROS was also positive for severe burning type pain on the bottom of both feet with no recent history of injury or trauma. This pain essentially resolved after a dose of IV dilaudid .    Assessment & Plan:   Principal Problem:   Acute thrombosis of right internal jugular vein (HCC) Active Problems:   Acute thrombosis of internal jugular vein, right (HCC)   Neck pain   Neck swelling   Pain in both feet   Thrombocytopenia  R internal jugular port-a-cath with peri-catheter thrombus - catheter associated venous thrombosis.  Patient was started on IV heparin  on admission and has received 48 hours of heparin  switching to Lovenox  today till 04/19/2024 and then starting Pradaxa 150 mg every 12.  Will check the pricing of Pradaxa tomorrow.  She developed this clot while she was on Eliquis  5 mg twice a day at home without missing any  doses. On Zosyn  to cover any infection if at all present, patient had significant pain yesterday today her pain has improved and she is ambulating in the hallway.  The IJ right site decreased tenderness and swelling noted.   IR consulted  would recommend heparin  no other intervention recommended empiric abx as suggested by IR  B LE neuropathic pain she reports improvement in pain will start her on Neurontin twice daily.   Breast CA on active chemotherapy No active issues at present    20 year Hx of PE and prior DVTs on Eliquis  followed by Dr. Ileana Continue IV heparin    Estimated body mass index is 26.14 kg/m as calculated from the following:   Height as of 03/31/24: 5' 11 (1.803 m).   Weight as of 04/08/24: 85 kg.  DVT prophylaxis: heparin  Code Status:full Family Communication:family at bedside Disposition Plan:  Status is: Inpatient Remains inpatient appropriate because: acute illness   Consultants:  onc  Procedures:none Antimicrobials:none  Subjective: C/o pain right neck   Objective: Vitals:   04/16/24 1420 04/16/24 2031 04/17/24 0507 04/17/24 1329  BP: 103/74 115/73 (!) 92/53 129/73  Pulse: 77 74 79 93  Resp: 15 17 16 16   Temp: 98.4 F (36.9 C) 98.4 F (36.9 C) 98.6 F (37 C) 98.1 F (36.7 C)  TempSrc: Oral Oral Oral Oral  SpO2: 100% 98% 98% 97%    Intake/Output Summary (Last 24 hours) at 04/17/2024 1401 Last data filed at 04/17/2024 1102 Gross per 24 hour  Intake 1138.38 ml  Output --  Net 1138.38 ml   There were no vitals filed for this visit.  Examination:  General exam: Appears in no acute distress Respiratory system: Clear to auscultation. Respiratory effort normal.  Right upper chest neck tender around the right IJ with swelling Cardiovascular system: Regular Gastrointestinal system: Abdomen is nondistended, soft and nontender. No organomegaly or masses felt. Normal bowel sounds heard. Central nervous system: Alert and oriented. No focal  neurological deficits. Extremities: No edema  Data Reviewed: I have personally reviewed following labs and imaging studies  CBC: Recent Labs  Lab 04/15/24 0237 04/15/24 1315 04/16/24 0508 04/17/24 0651  WBC 5.2 4.4 3.9* 5.9  NEUTROABS 3.6  --   --   --   HGB 12.5 12.2 12.4 12.8  HCT 36.7 36.2 36.8 38.0  MCV 83.8 82.8 83.4 83.7  PLT 87* 78* 84* 99*   Basic Metabolic Panel: Recent Labs  Lab 04/15/24 0237 04/16/24 0508  NA 138 138  K 3.9 4.5  CL 103 104  CO2 25 25  GLUCOSE 99 106*  BUN 8 9  CREATININE 0.75 0.82  CALCIUM 9.2 9.4   GFR: Estimated Creatinine Clearance: 110.1 mL/min (by C-G formula based on SCr of 0.82 mg/dL). Liver Function Tests: Recent Labs  Lab 04/15/24 0237  AST 15  ALT 18  ALKPHOS 84  BILITOT 0.4  PROT 6.3*  ALBUMIN 4.0   No results for input(s): LIPASE, AMYLASE in the last 168 hours. No results for input(s): AMMONIA in the last 168 hours. Coagulation Profile: No results for input(s): INR, PROTIME in the last 168 hours. Cardiac Enzymes: No results for input(s): CKTOTAL, CKMB, CKMBINDEX, TROPONINI in the last 168 hours. BNP (last 3 results) No results for input(s): PROBNP in the last 8760 hours. HbA1C: No results for input(s): HGBA1C in the last 72 hours. CBG: No results for input(s): GLUCAP in the last 168 hours. Lipid Profile: No results for input(s): CHOL, HDL, LDLCALC, TRIG, CHOLHDL, LDLDIRECT in the last 72 hours. Thyroid Function Tests: No results for input(s): TSH, T4TOTAL, FREET4, T3FREE, THYROIDAB in the last 72 hours. Anemia Panel: No results for input(s): VITAMINB12, FOLATE, FERRITIN, TIBC, IRON, RETICCTPCT in the last 72 hours. Sepsis Labs: Recent Labs  Lab 04/15/24 0237  LATICACIDVEN 0.9    Recent Results (from the past 240 hours)  Culture, blood (Routine X 2) w Reflex to ID Panel     Status: None (Preliminary result)   Collection Time: 04/16/24  4:05 PM    Specimen: BLOOD RIGHT HAND  Result Value Ref Range Status   Specimen Description   Final    BLOOD RIGHT HAND Performed at [facility], [address], [place], [place] [address]    Special Requests   Final    BOTTLES DRAWN AEROBIC AND ANAEROBIC Blood Culture adequate volume Performed at [facility], [address] [place], [place] [address]    Culture   Final    NO GROWTH < 12 HOURS Performed at [facility], [address] [place], [place] [address]    Report Status PENDING  Incomplete  Culture, blood (Routine X 2) w Reflex to ID Panel     Status: None (Preliminary result)   Collection Time: 04/16/24  4:05 PM   Specimen: BLOOD RIGHT ARM  Result Value Ref Range Status   Specimen Description   Final    BLOOD RIGHT ARM Performed at [facility], [address], [place], [place] [address]    Special Requests  Final    BOTTLES DRAWN AEROBIC ONLY Blood Culture adequate volume Performed at [facility], [address], [place], [place] [address]    Culture   Final    NO GROWTH < 12 HOURS Performed at [facility], [address], [place], [place] [address]    Report Status PENDING  Incomplete     Radiology Studies: No results found.    Scheduled Meds:  bisacodyl  10 mg Oral Daily   [START ON 04/20/2024] dabigatran  150 mg Oral Q12H   enoxaparin  (LOVENOX ) injection  1 mg/kg Subcutaneous Q12H   gabapentin  200 mg Oral BID   pantoprazole   40 mg Oral BID   polyethylene glycol  17 g Oral Daily   senna  2 tablet Oral QHS   Continuous Infusions:  piperacillin -tazobactam (ZOSYN )  IV 3.375 g (04/17/24 1356)     LOS: 2 days   Almarie KANDICE Hoots, MD 04/17/2024, 2:01 PM   "

## 2024-04-17 NOTE — Plan of Care (Signed)

## 2024-04-17 NOTE — Plan of Care (Signed)

## 2024-04-17 NOTE — Assessment & Plan Note (Signed)
 Resolved

## 2024-04-17 NOTE — Progress Notes (Addendum)
 Monica Holland   DOB:01-21-1986   [id number]    ASSESSMENT & PLAN:  39 y.o.w with history of recurrent VTE including DVT, PE dated back to thousand 4, left breast in female, estrogen receptor positive cT2N1M0 stage IIA, G2, ER+/PR+/HER2-currently undergoing neoadjuvant chemotherapy presented with acute onset of swelling and pain over her neck and found to have catheter associated DVT.  Chart reviewed in detail.  Patient has history of recurrent DVT dating back to 2004, and 2018.  DVT involving left lower extremity and associated with pregnancy.  Reports she has been on and off from anticoagulation in between but has been off until 2021.  In 2021 she presented with bilateral lower extremity pain and swelling.  She was found to have chronic DVT involving left popliteal vein.  She was placed on Eliquis  and evaluated for chronic venous insufficiency by vascular surgery.  No intervention was found to be helpful.  She has no May-Thurner syndrome.  She saw Dr. Amadeo in 2022 and was already on Eliquis  at the time.  She was diagnosed of stage IIA left breast cancer in late 2025.  She underwent port placement on 03/31/2024.  She started neoadjuvant chemotherapy with AC on 12/23.  Catheter associated thrombus on the right side identified on 12/30.   Her renal function appeared normal.  She has multiple risk factors of DVT including active malignancy, recent port placement, initiation of chemotherapy, in addition to baseline heightened personal history and family risk factors. She was also smoking. It is not unusual that this may happen.  Given she has not been on Pradaxa before, this can be an option.  Recommend continue IV heparin  for total 48 hours.  After that, may transition to Pradaxa twice daily.   04/17/24.  Report feeling better.  Pain mostly resolved.  There is no redness around the incision site.    Patient also decided to stop smoking understanding as one of the many risk factors for DVT.  I have  applauded her for this.  Assessment & Plan Acute thrombosis of right internal jugular vein [facility]) May transition to Pradaxa when able Discuss precautions on anticoagulation which she understands and familiar with. Neck pain Mostly resolved.   Neck swelling No apparent swelling today Pain in both feet Resolved Thrombocytopenia Improving secondary to chemotherapy   Discharge planning Oncology follow-up is in place.  Currently scheduled for 1/6.  All questions were answered.   Thank you for the consult.  Will sign off.  Please call if any questions.  Monica JAYSON Chihuahua, MD 04/17/2024 9:56 AM  Subjective:  Monica Holland reports feeling better today.  Neck pain mostly resolved.  No swelling, drainage.  No fever or chills reported overnight.  Leg pain resolved.  She denies any bleeding.  Able to take deep breath without chest pain.  Walking on the hallway.  She told me she decided to quit smoking.  Past Surgical History:  Procedure Laterality Date   ANKLE SURGERY Right 2014   ARTHROSCOPY KNEE W/ DRILLING Left 10/2023   BREAST BIOPSY Left 03/19/2024   US  LT BREAST BX W LOC DEV EA ADD LESION IMG BX SPEC US  GUIDE 03/19/2024 [facility]   BREAST BIOPSY Left 03/19/2024   US  LT BREAST BX W LOC DEV 1ST LESION IMG BX SPEC US  GUIDE 03/19/2024 [facility]   BREAST BIOPSY Left 03/19/2024   US  LT BREAST BX W LOC DEV EA ADD LESION IMG BX SPEC US  GUIDE 03/19/2024 [facility]   BREAST BIOPSY Right 04/14/2024  US  RT BREAST BX W LOC DEV 1ST LESION IMG BX SPEC US  GUIDE 04/14/2024 [facility]   DILITATION & CURRETTAGE/HYSTROSCOPY WITH NOVASURE ABLATION N/A 01/04/2022   Procedure: DILATATION & CURETTAGE/HYSTEROSCOPY WITH NOVASURE ABLATION;  Surgeon: Cleatus Moccasin, MD;  Location: [facility] [place];  Service: Gynecology;  Laterality: N/A;   ESOPHAGOGASTRODUODENOSCOPY (EGD) WITH PROPOFOL   12/29/2021   by dr federico   INTRAUTERINE DEVICE (IUD) INSERTION N/A 01/04/2022    Procedure: INTRAUTERINE DEVICE (IUD) INSERTION;  Surgeon: Cleatus Moccasin, MD;  Location: [facility];  Service: Gynecology;  Laterality: N/A;   PORTACATH PLACEMENT N/A 03/31/2024   Procedure: INSERTION, TUNNELED CENTRAL VENOUS DEVICE, WITH PORT;  Surgeon: Curvin [name], MD;  Location: [facility];  Service: General;  Laterality: N/A;  PORT PLACEMENT WITH ULTRASOUND GUIDANCE   ROTATOR CUFF REPAIR Left 12/2023   SHOULDER ARTHROSCOPY Right 2024     Objective:  Vitals:   04/16/24 2031 04/17/24 0507  BP: 115/73 (!) 92/53  Pulse: 74 79  Resp: 17 16  Temp: 98.4 F (36.9 C) 98.6 F (37 C)  SpO2: 98% 98%     Intake/Output Summary (Last 24 hours) at 04/17/2024 0956 Last data filed at 04/17/2024 0600 Gross per 24 hour  Intake 1009.64 ml  Output --  Net 1009.64 ml    GENERAL: alert, no distress and comfortable SKIN: skin color normal NECK: No swelling, erythema, incisions appear healing and intact LUNGS: Able to take deep breaths without difficulty with normal breathing effort.  Musculoskeletal: no lower extremity edema NEURO: alert with fluent speech   Labs:  Recent Labs    03/26/24 1301 04/08/24 0754 04/15/24 0237 04/16/24 0508  NA 141 140 138 138  K 4.2 4.0 3.9 4.5  CL 106 106 103 104  CO2 30 26 25 25   GLUCOSE 93 100* 99 106*  BUN 9 13 8 9   CREATININE 0.85 0.72 0.75 0.82  CALCIUM 9.6 9.2 9.2 9.4  GFRNONAA >60 >60 >60 >60  PROT 7.2 7.1 6.3*  --   ALBUMIN 4.5 4.5 4.0  --   AST 20 18 15   --   ALT 16 14 18   --   ALKPHOS 64 67 84  --   BILITOT 0.8 0.4 0.4  --     Studies:  I reviewed her recent imaging related to thrombosis

## 2024-04-17 NOTE — Assessment & Plan Note (Signed)
 Improving secondary to chemotherapy

## 2024-04-17 NOTE — Assessment & Plan Note (Signed)
 No apparent swelling today

## 2024-04-17 NOTE — Discharge Instructions (Signed)
 Information on my medicine - Pradaxa (dabigatran)  This medication education was reviewed with me or my healthcare representative as part of my discharge preparation.  Why was Pradaxa prescribed for you? Pradaxa was prescribed to treat blood clots that may have been found in the veins of your legs (deep vein thrombosis) or in your lungs (pulmonary embolism) and to reduce the risk of them occurring again.  Pradaxa will take the place of the injectable anticoagulant medication you have been receiving for this condition for the last 5-10 days.  What do you Need to know about PradAXa? Take your Pradaxa TWICE DAILY - one capsule in the morning and one tablet in the evening with or without food.  It would be best to take the doses about the same time each day.  The capsules should not be broken, chewed or opened - they must be swallowed whole.  Do not store Pradaxa in other medication containers - once the bottle is opened the Pradaxa should be used within FOUR months; throw away any capsules that havent been by that time.  Take Pradaxa exactly as prescribed by your doctor.  DO NOT stop taking Pradaxa without talking to the doctor who prescribed the medication.  Refill your prescription before you run out.  After discharge, you should have regular check-up appointments with your healthcare provider that is prescribing your Pradaxa.  In the future your dose may need to be changed if your kidney function or weight changes by a significant amount.  What do you do if you miss a dose? If you miss a dose, take it as soon as you remember on the same day.  If your next dose is less than 6 hours away, skip the missed dose.  Do not take two doses of PRADAXA at the same time.  Important Safety Information A possible side effect of Pradaxa is bleeding. You should call your healthcare provider right away if you experience any of the following: ? Bleeding from an injury or your nose that does not  stop. ? Unusual colored urine (red or dark brown) or unusual colored stools (red or black). ? Unusual bruising for unknown reasons. ? A serious fall or if you hit your head (even if there is no bleeding).  Some medicines may interact with Pradaxa and might increase your risk of bleeding or clotting while on Pradaxa. To help avoid this, consult your healthcare provider or pharmacist prior to using any new prescription or non-prescription medications, including herbals, vitamins, non-steroidal anti-inflammatory drugs (NSAIDs) and supplements.  This website has more information on Pradaxa (dabigatran): www.HDTV[employer].dk.

## 2024-04-17 NOTE — Assessment & Plan Note (Signed)
"  Mostly resolved   "

## 2024-04-17 NOTE — Assessment & Plan Note (Addendum)
 May transition to Pradaxa when able Discuss precautions on anticoagulation which she understands and familiar with.

## 2024-04-18 ENCOUNTER — Telehealth (HOSPITAL_BASED_OUTPATIENT_CLINIC_OR_DEPARTMENT_OTHER): Payer: Self-pay | Admitting: Emergency Medicine

## 2024-04-18 ENCOUNTER — Encounter: Payer: Self-pay | Admitting: Hematology

## 2024-04-18 ENCOUNTER — Telehealth (HOSPITAL_COMMUNITY): Payer: Self-pay | Admitting: Pharmacy Technician

## 2024-04-18 ENCOUNTER — Other Ambulatory Visit (HOSPITAL_COMMUNITY): Payer: Self-pay

## 2024-04-18 ENCOUNTER — Other Ambulatory Visit: Payer: Self-pay | Admitting: Internal Medicine

## 2024-04-18 ENCOUNTER — Telehealth: Payer: Self-pay

## 2024-04-18 LAB — CBC
HCT: 42.1 % (ref 36.0–46.0)
Hemoglobin: 13.7 g/dL (ref 12.0–15.0)
MCH: 27.6 pg (ref 26.0–34.0)
MCHC: 32.5 g/dL (ref 30.0–36.0)
MCV: 84.7 fL (ref 80.0–100.0)
Platelets: 122 K/uL — ABNORMAL LOW (ref 150–400)
RBC: 4.97 MIL/uL (ref 3.87–5.11)
RDW: 14.6 % (ref 11.5–15.5)
WBC: 8.7 K/uL (ref 4.0–10.5)
nRBC: 0 % (ref 0.0–0.2)

## 2024-04-18 MED ORDER — GABAPENTIN 100 MG PO CAPS: 200.0000 mg | ORAL_CAPSULE | Freq: Two times a day (BID) | ORAL | 1 refills | Status: DC

## 2024-04-18 MED ORDER — ENOXAPARIN SODIUM 100 MG/ML IJ SOSY: 1.0000 mg/kg | PREFILLED_SYRINGE | Freq: Two times a day (BID) | INTRAMUSCULAR | 0 refills | Status: AC

## 2024-04-18 MED ORDER — OXYCODONE HCL 5 MG PO TABS: 5.0000 mg | ORAL_TABLET | Freq: Four times a day (QID) | ORAL | 0 refills | Status: AC | PRN

## 2024-04-18 MED ORDER — POLYETHYLENE GLYCOL 3350 17 G PO PACK: 17.0000 g | PACK | Freq: Every day | ORAL | 0 refills | Status: AC

## 2024-04-18 MED ORDER — DABIGATRAN ETEXILATE MESYLATE 150 MG PO CAPS: 150.0000 mg | ORAL_CAPSULE | Freq: Two times a day (BID) | ORAL | 2 refills | Status: AC

## 2024-04-18 MED ORDER — SENNA 8.6 MG PO TABS: 2.0000 | ORAL_TABLET | Freq: Every day | ORAL | 0 refills | Status: AC

## 2024-04-18 MED ORDER — ACETAMINOPHEN 325 MG PO TABS: 650.0000 mg | ORAL_TABLET | Freq: Four times a day (QID) | ORAL | Status: AC | PRN

## 2024-04-18 MED ORDER — AMOXICILLIN-POT CLAVULANATE 875-125 MG PO TABS: 1.0000 | ORAL_TABLET | Freq: Two times a day (BID) | ORAL | 0 refills | Status: AC

## 2024-04-18 NOTE — Plan of Care (Signed)
  Problem: Education: Goal: Knowledge of General Education information will improve Description: Including pain rating scale, medication(s)/side effects and non-pharmacologic comfort measures Outcome: Progressing   Problem: Health Behavior/Discharge Planning: Goal: Ability to manage health-related needs will improve Outcome: Progressing   Problem: Clinical Measurements: Goal: Ability to maintain clinical measurements within normal limits will improve Outcome: Progressing Goal: Will remain free from infection Outcome: Progressing Goal: Diagnostic test results will improve Outcome: Progressing Goal: Respiratory complications will improve Outcome: Progressing Goal: Cardiovascular complication will be avoided Outcome: Progressing   Problem: Activity: Goal: Risk for activity intolerance will decrease Outcome: Progressing   Problem: Nutrition: Goal: Adequate nutrition will be maintained Outcome: Progressing   Problem: Coping: Goal: Level of anxiety will decrease Outcome: Progressing   Problem: Safety: Goal: Ability to remain free from injury will improve Outcome: Progressing   Problem: Pain Managment: Goal: General experience of comfort will improve and/or be controlled Outcome: Progressing   Problem: Skin Integrity: Goal: Risk for impaired skin integrity will decrease Outcome: Progressing

## 2024-04-18 NOTE — Progress Notes (Unsigned)
 {  Select_TRH_Note:[id number]}

## 2024-04-18 NOTE — Plan of Care (Signed)

## 2024-04-18 NOTE — Telephone Encounter (Signed)
 [id number]: Complementary Options for Symptom Management In Cancer (COSMIC) Assessing Benefits and Harms of Cannabis and Cannabinoid Use Among a Cohort of Cancer Patients Treated in [facility]    Confirmed participant did not receive screener via email. Verified with participant and checked [employer]. Confirmed correct email was entered. Will follow up with study for survey resend.

## 2024-04-18 NOTE — Discharge Summary (Signed)
 Physician Discharge Summary  Monica Holland FMW:[id number] DOB: 1985/06/14 DOA: 04/15/2024  PCP: [facility]  Admit date: 04/15/2024 Discharge date: 04/18/2024  Admitted From: home Disposition:home Recommendations for Outpatient Follow-up:  Follow up with PCP in 1-2 weeks Please obtain BMP/CBC in one week Please follow up with dr lanny  Home Health:no Equipment/Devices:none Discharge Condition:stable CODE STATUS:full Diet recommendation: cardiac Brief/Interim Summary:  39 year old with a history of PE and DVT on Eliquis , tricuspid valve regurgitation, and breast cancer status post port placement 03/31/2024 (per Dr. Curvin) with recent initiation of chemotherapy approximately 1 week prior who [facility] with the acute onset of severe pain around her port associated with swelling of the neck with tenderness to palpation over the right neck.  On exam she was found to have redness and swelling at the right IJ port.  CTa revealed no evidence of PE but did confirm a right IJ chest port with intraluminal thrombus above the level of the catheter extending approximately 4.7 cm.  This finding was discussed with general surgery who recommended vascular surgery consultation.  Vascular surgery was called and they recommended IR consultation.  IR was called and they recommended IV heparin , empiric antibiotics, and admission for observation with possible need to replace the port if it is no longer functional.  ROS was also positive for severe burning type pain on the bottom of both feet with no recent history of injury or trauma. This pain essentially resolved after a dose of IV dilaudid .   Discharge Diagnoses:  Principal Problem:   Acute thrombosis of right internal jugular vein (HCC) Active Problems:   Acute thrombosis of internal jugular vein, right (HCC)   Neck pain   Neck swelling   Pain in both feet   Thrombocytopenia    R internal jugular port-a-cath with  peri-catheter thrombus - catheter associated venous thrombosis.  Patient was started on IV heparin  on admission and has received 48 hours of heparin  and then switched to Lovenox  for 3 days and then she will start Pradaxa 150 mg twice a day on 4 January.  Copayments were checked prior to discharge $4 per month for Pradaxa and Lovenox .   She developed this clot while she was on Eliquis  5 mg twice a day at home without missing any doses.  She was also treated with Zosyn  for any infection if at all present, cultures remain negative.  She was given prescription for Augmentin  on discharge.   B LE neuropathic pain improved with Neurontin.  Continue Neurontin on discharge.   Breast CA on active chemotherapy No active issues at present her next chemotherapy session will be next week she is aware she needs to call the office on Monday to see if she can have chemo on Tuesday, January 6.   39 year Hx of PE and prior DVTs was on Eliquis  prior to admission.  Discharged on Pradaxa twice daily.    Estimated body mass index is 26.14 kg/m as calculated from the following:   Height as of 03/31/24: 5' 11 (1.803 m).   Weight as of 04/08/24: 85 kg.  Discharge Instructions  Discharge Instructions     Increase activity slowly   Complete by: As directed       Allergies as of 04/18/2024       Reactions   Nsaids Other (See Comments)   Eliquis          Medication List     STOP taking these medications    apixaban  5  MG Tabs tablet Commonly known as: Eliquis    dexamethasone  4 MG tablet Commonly known as: DECADRON    dicyclomine  20 MG tablet Commonly known as: BENTYL    lidocaine  5 % Commonly known as: Lidoderm    lidocaine  5 % ointment Commonly known as: XYLOCAINE    lidocaine -prilocaine  cream Commonly known as: EMLA    prochlorperazine  10 MG tablet Commonly known as: COMPAZINE    tiZANidine  2 MG tablet Commonly known as: ZANAFLEX        TAKE these medications    acetaminophen  325 MG  tablet Commonly known as: TYLENOL  Take 2 tablets (650 mg total) by mouth every 6 (six) hours as needed for mild pain (pain score 1-3), fever or headache.   albuterol  108 (90 Base) MCG/ACT inhaler Commonly known as: VENTOLIN  HFA Inhale 1-2 puffs into the lungs every 6 (six) hours as needed for wheezing or shortness of breath.   amoxicillin -clavulanate 875-125 MG tablet Commonly known as: AUGMENTIN  Take 1 tablet by mouth 2 (two) times daily for 7 days. Notes to patient: Take with food   dabigatran 150 MG Caps capsule Commonly known as: PRADAXA Take 1 capsule (150 mg total) by mouth 2 (two) times daily. Start taking on: April 20, 2024   enoxaparin  100 MG/ML injection Commonly known as: LOVENOX  Inject 0.85 mLs (85 mg total) into the skin every 12 (twelve) hours. Notes to patient: 9 am & 9 pm   famotidine  20 MG tablet Commonly known as: PEPCID  Take 20 mg by mouth 2 (two) times daily.   fluticasone  50 MCG/ACT nasal spray Commonly known as: FLONASE  Place 2 sprays into both nostrils daily.   gabapentin 100 MG capsule Commonly known as: NEURONTIN Take 2 capsules (200 mg total) by mouth 2 (two) times daily.   loratadine  10 MG tablet Commonly known as: CLARITIN  Take 10 mg by mouth every morning.   ondansetron  8 MG tablet Commonly known as: Zofran  Take 1 tab (8 mg) by mouth every 8 hrs as needed for nausea/vomiting. Start third day after doxorubicin /cyclophosphamide  chemotherapy.   oxyCODONE  5 MG immediate release tablet Commonly known as: Roxicodone  Take 1 tablet (5 mg total) by mouth every 6 (six) hours as needed.   pantoprazole  40 MG tablet Commonly known as: PROTONIX  Take 1 tablet (40 mg total) by mouth 2 (two) times daily.   polyethylene glycol 17 g packet Commonly known as: MIRALAX / GLYCOLAX Take 17 g by mouth daily. Start taking on: April 19, 2024   senna 8.6 MG Tabs tablet Commonly known as: SENOKOT Take 2 tablets (17.2 mg total) by mouth at bedtime.         Follow-up Information     [facility]   Contact information: [address] [place] [address] [phone]         [name], MD Follow up.   Specialties: Hematology, Oncology Contact information: [address] [place] [address] [phone]                Allergies[1]  Consultations: Oncology, interventional radiology   Procedures/Studies: US  RT BREAST BX W LOC DEV 1ST LESION IMG BX SPEC US  GUIDE Addendum Date: 04/16/2024 ADDENDUM REPORT: 04/16/2024 10:31 ADDENDUM: PATHOLOGY revealed: Breast, right, needle core biopsy, 9 o'clock, 2 cmfn, venus- FIBROADENOMA Pathology results are CONCORDANT with imaging findings, per Dr. Aliene Mir. Pathology results and recommendations below were discussed with patient by telephone. Patient reported biopsy site within normal limits with slight tenderness at the site. Post biopsy care instructions were reviewed, questions were answered and my  direct phone number was provided to patient. Patient was instructed to call [facility] if any concerns or questions arise related to the biopsy. RECOMMENDATION: 1. Patient to follow current treatment plan / surgical management for recently diagnosed breast cancer. 2. Ultrasound-guided biopsy of RIGHT axillary lymph node, with borderline cortical thickening measuring up to 0.4 cm, could not be performed due to its close proximity to a large artery. If clinically relevant, this lymph node can be tagged with radioactive seed for excisional biopsy. Pathology results reported by Mliss CHARM Molt RN 04/16/2024. Electronically Signed   By: Aliene Lloyd M.D.   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 EXAM: ULTRASOUND GUIDED RIGHT BREAST CORE NEEDLE BIOPSY  COMPARISON:  Previous exam(s). PROCEDURE: I met with the patient and we discussed the procedure of ultrasound-guided biopsy, including benefits and alternatives. We discussed the high likelihood of a successful procedure. We discussed the risks of the procedure, including infection, bleeding, tissue injury, clip migration, and inadequate sampling. Informed written consent was given. The usual time-out protocol was performed immediately prior to the procedure. Lesion quadrant: Upper outer quadrant Using sterile technique and 1% Lidocaine  as local anesthetic, under direct ultrasound visualization, a 14 gauge spring-loaded device was used to perform biopsy of 0.9 cm RIGHT breast mass using a superior approach. At the conclusion of the procedure venus shaped tissue marker clip was deployed into the biopsy cavity. Follow up 2 view mammogram was performed and dictated separately. IMPRESSION: 1. Ultrasound guided biopsy of 0.9 cm RIGHT breast mass (9 o'clock 2 CMFN). No apparent complications. 2. Ultrasound-guided biopsy of RIGHT axillary lymph node with borderline cortical thickening measuring up to 0.4 cm could not be performed due to its close proximity to a large artery. Electronically Signed: By: Aliene Lloyd M.D. On: 04/14/2024 12:31   CT Angio Chest PE W and/or Wo Contrast Result Date: 04/15/2024 EXAM: CTA CHEST 04/15/2024 03:56:03 AM TECHNIQUE: CTA of the chest was performed after the administration of intravenous contrast. Multiplanar reformatted images are provided for review. MIP images are provided for review. Automated exposure control, iterative reconstruction, and/or weight based adjustment of the mA/kV was utilized to reduce the radiation dose to as low as reasonably achievable. COMPARISON: CT of the chest dated 04/04/2024. CLINICAL HISTORY: Pulmonary embolism (PE) suspected, high prob. FINDINGS: PULMONARY ARTERIES: Pulmonary arteries are adequately opacified for evaluation. No acute pulmonary embolus.  Main pulmonary artery is normal in caliber. MEDIASTINUM: The heart and pericardium demonstrate no acute abnormality. There is no acute abnormality of the thoracic aorta. LYMPH NODES: No mediastinal, hilar or axillary lymphadenopathy. LUNGS AND PLEURA: There are ground-glass and reticular opacities present dependently within the lower lobes bilaterally, likely representing combination of edema and atelectasis. No evidence of pleural effusion or pneumothorax. UPPER ABDOMEN: Limited images of the upper abdomen are unremarkable. SOFT TISSUES AND BONES: A right internal jugular chest port is present. There is a 13 mm round cystic lesion within the inferior pole of the left lobe of the thyroid. No acute bone abnormality. IMPRESSION: 1. No pulmonary embolism. 2. Ground-glass and reticular opacities in the lower lobes bilaterally, likely representing edema and atelectasis. Electronically signed by: Evalene Coho MD 04/15/2024 04:10 AM EST RP Workstation: [id number]   CT Soft Tissue Neck W Contrast Result Date: 04/15/2024 EXAM: CT NECK WITH CONTRAST 04/15/2024 03:56:03 AM TECHNIQUE: CT  of the neck was performed with the administration of 100 mL of iohexol  (OMNIPAQUE ) 350 MG/ML injection. Multiplanar reformatted images are provided for review. Automated exposure control, iterative reconstruction, and/or weight based adjustment of the mA/kV was utilized to reduce the radiation dose to as low as reasonably achievable. COMPARISON: None available. CLINICAL HISTORY: Neck infection. FINDINGS: AERODIGESTIVE TRACT: No discrete mass. No edema. SALIVARY GLANDS: The parotid and submandibular glands are unremarkable. THYROID: There is a 13 mm round cystic lesion present within the inferior pole of the left lobe of the thyroid. LYMPH NODES: No suspicious cervical lymphadenopathy. SOFT TISSUES: There is a right-sided internal jugular chest port present. There is intraluminal thrombus present within the internal jugular vein above  the level of the catheter extending approximately 4.7 cm. No mass or fluid collection. BONES: No abnormality. OTHER: Visualized sinuses and mastoid air cells are well aerated. Visualized lungs are clear. IMPRESSION: 1. Right-sided internal jugular chest port with intraluminal thrombus above the level of the catheter extending approximately 4.7 cm. 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 EXAM: ULTRASOUND OF THE RIGHT BREAST COMPARISON:  MRI breast 04/04/2024. Diagnostic mammogram 03/19/2024. FINDINGS: Targeted sonographic evaluation of the RIGHT breast and axilla was performed. 0.9 x 0.4 x 0.8 cm oval circumscribed hypoechoic mass seen at 9 o'clock 2 CMFN corresponds to the mass seen on MRI. RIGHT axillary lymph node with borderline cortical thickening measuring 0.4 cm. IMPRESSION: 1. Indeterminate 0.9 cm RIGHT breast mass (9 o'clock 2 CMFN), corresponding to the mass seen on MRI. 2. Borderline thickening of RIGHT axillary lymph node measuring up to 0.4 cm. RECOMMENDATION: 1. Ultrasound-guided core needle biopsy 0.9 cm RIGHT breast mass (9 o'clock 2 CMFN). 2. Ultrasound-guided core needle biopsy of RIGHT axillary lymph node with cortical thickness measuring 0.4 cm. I have discussed the findings and recommendations with the patient. The biopsy procedure was explained to the patient and questions were answered. Patient expressed their understanding of the biopsy recommendation. Patient will be scheduled for biopsy at her earliest convenience by the schedulers. Ordering provider will be notified. If applicable, a reminder letter  will be sent to the patient regarding the next appointment. BI-RADS CATEGORY  4: Suspicious. Electronically Signed   By: Aliene Lloyd M.D.   On: 04/14/2024 12:29   MM CLIP PLACEMENT RIGHT Result Date: 04/14/2024 CLINICAL DATA:  Status post ultrasound-guided biopsy of RIGHT breast mass EXAM: 3D DIAGNOSTIC RIGHT MAMMOGRAM POST ULTRASOUND BIOPSY COMPARISON:  Previous exam(s). ACR Breast Density Category c: The breasts are heterogeneously dense, which may obscure small masses. FINDINGS: 3D Mammographic images were obtained following ultrasound guided biopsy of RIGHT breast mass. The biopsy marking clip is in expected position at the site of biopsy. IMPRESSION: Appropriate positioning of the Venus shaped biopsy marking clip at the site of biopsy in the anterior depth of the outer RIGHT breast. Final Assessment: Post Procedure Mammograms for Marker Placement Electronically Signed   By: Aliene Lloyd M.D.   On: 04/14/2024 12:25   NM Bone Scan Whole Body Result Date: 04/08/2024 CLINICAL DATA:  Invasive breast cancer.  Staging. EXAM: NUCLEAR MEDICINE WHOLE BODY BONE SCAN TECHNIQUE: Whole body anterior and posterior images were obtained approximately 3 hours  after intravenous injection of radiopharmaceutical. RADIOPHARMACEUTICALS:  21.1 mCi Technetium-93m MDP IV COMPARISON:  Chest abdomen pelvis CT 04/04/2024. FINDINGS: No focal radiotracer accumulation within the skeletal anatomy to raise concern for bony metastatic involvement. IMPRESSION: No findings to suggest active skeletal metastases. Electronically Signed   By: Camellia Candle M.D.   On: 04/08/2024 09:10   CT ABDOMEN PELVIS W CONTRAST Result Date: 04/08/2024 CLINICAL DATA:  Breast cancer, invasive, stage I/II/III, initial workup new breast cancer with metastatic lymph nodes prior to starting chemo. * Tracking Code: BO * EXAM: CT CHEST, ABDOMEN, AND PELVIS WITH CONTRAST TECHNIQUE: Multidetector CT imaging of the chest, abdomen and pelvis was performed  following the standard protocol during bolus administration of intravenous contrast. RADIATION DOSE REDUCTION: This exam was performed according to the departmental dose-optimization program which includes automated exposure control, adjustment of the mA and/or kV according to patient size and/or use of iterative reconstruction technique. CONTRAST:  OMNIPAQUE  IOHEXOL  300 MG/ML  SOLN COMPARISON:  CT scan chest, abdomen and pelvis from 03/17/2024. FINDINGS: CT CHEST FINDINGS Cardiovascular: Normal cardiac size. No pericardial effusion. No aortic aneurysm. Mediastinum/Nodes: Redemonstration of a 1.0 x 1.2 cm hypoattenuating nodule in the inferior left thyroid lobe, incompletely characterized on the current exam but unchanged since the prior study. The nodule does not meet the size criteria for follow-up ultrasound evaluation. No solid / cystic mediastinal masses. The esophagus is nondistended precluding optimal assessment. No mediastinal or hilar lymphadenopathy by size criteria. Redemonstration of several enlarged heterogeneous rounded axillary lymph nodes with largest measuring up to 1.2 x 1.8 cm, which previously measured 1.2 x 1.6 cm. Overall no significant interval change over 3 weeks' duration. No right axillary lymphadenopathy by size criteria. Lungs/Pleura: The central tracheo-bronchial tree is patent. No mass or consolidation. No pleural effusion or pneumothorax. No suspicious lung nodules. Musculoskeletal: A CT Port-a-Cath is seen in the right upper chest wall with the catheter terminating in the cavo-atrial junction region. Visualized soft tissues of the chest wall are otherwise grossly unremarkable. No suspicious osseous lesions. CT ABDOMEN PELVIS FINDINGS Hepatobiliary: The liver is normal in size. Non-cirrhotic configuration. No suspicious mass. No intrahepatic or extrahepatic bile duct dilation. No calcified gallstones. Normal gallbladder wall thickness. No pericholecystic inflammatory changes.  Pancreas: Unremarkable. No pancreatic ductal dilatation or surrounding inflammatory changes. Spleen: Within normal limits. No focal lesion. Adrenals/Urinary Tract: Adrenal glands are unremarkable. No suspicious renal mass. No hydronephrosis. There is a punctate nonobstructing calculus in the right kidney upper pole. No other renal or ureteric calculi. Unremarkable urinary bladder. Stomach/Bowel: No disproportionate dilation of the small or large bowel loops. No evidence of abnormal bowel wall thickening or inflammatory changes. The appendix is unremarkable. Vascular/Lymphatic: No ascites or pneumoperitoneum. No abdominal or pelvic lymphadenopathy, by size criteria. No aneurysmal dilation of the major abdominal arteries. Reproductive: Not well evaluated on the CT scan exam. However, having said that note is made of bulky retroverted uterus with intrauterine device. The cervix appears bulky however, not well evaluated on this exam. Correlate clinically to determine the need for additional imaging. Bilateral adnexa appears within normal limits. Other: There is a tiny fat containing umbilical hernia. Multiple venous collaterals noted in the subcutaneous tissue over the bilateral inguinal regions/lower anterior pelvis, nonspecific but typically seen as a sequela of chronic venous thrombosis of the upstream vein which are unfortunately not well evaluated on this phase of contrast. The soft tissues and abdominal wall are otherwise unremarkable. Musculoskeletal: No suspicious osseous lesions. IMPRESSION: 1. Redemonstration of multiple enlarged left  axillary lymph nodes, concerning for metastases. No right axillary lymphadenopathy by size criteria. No mediastinal or hilar lymphadenopathy. No abdominal or pelvic lymphadenopathy by size criteria. 2. No other metastatic disease identified within the chest, abdomen or pelvis. 3. Multiple other nonacute observations, as described above. Electronically Signed   By: Ree Molt  M.D.   On: 04/08/2024 08:35   CT Chest W Contrast Result Date: 04/08/2024 CLINICAL DATA:  Breast cancer, invasive, stage I/II/III, initial workup new breast cancer with metastatic lymph nodes prior to starting chemo. * Tracking Code: BO * EXAM: CT CHEST, ABDOMEN, AND PELVIS WITH CONTRAST TECHNIQUE: Multidetector CT imaging of the chest, abdomen and pelvis was performed following the standard protocol during bolus administration of intravenous contrast. RADIATION DOSE REDUCTION: This exam was performed according to the departmental dose-optimization program which includes automated exposure control, adjustment of the mA and/or kV according to patient size and/or use of iterative reconstruction technique. CONTRAST:  OMNIPAQUE  IOHEXOL  300 MG/ML  SOLN COMPARISON:  CT scan chest, abdomen and pelvis from 03/17/2024. FINDINGS: CT CHEST FINDINGS Cardiovascular: Normal cardiac size. No pericardial effusion. No aortic aneurysm. Mediastinum/Nodes: Redemonstration of a 1.0 x 1.2 cm hypoattenuating nodule in the inferior left thyroid lobe, incompletely characterized on the current exam but unchanged since the prior study. The nodule does not meet the size criteria for follow-up ultrasound evaluation. No solid / cystic mediastinal masses. The esophagus is nondistended precluding optimal assessment. No mediastinal or hilar lymphadenopathy by size criteria. Redemonstration of several enlarged heterogeneous rounded axillary lymph nodes with largest measuring up to 1.2 x 1.8 cm, which previously measured 1.2 x 1.6 cm. Overall no significant interval change over 3 weeks' duration. No right axillary lymphadenopathy by size criteria. Lungs/Pleura: The central tracheo-bronchial tree is patent. No mass or consolidation. No pleural effusion or pneumothorax. No suspicious lung nodules. Musculoskeletal: A CT Port-a-Cath is seen in the right upper chest wall with the catheter terminating in the cavo-atrial junction region.  Visualized soft tissues of the chest wall are otherwise grossly unremarkable. No suspicious osseous lesions. CT ABDOMEN PELVIS FINDINGS Hepatobiliary: The liver is normal in size. Non-cirrhotic configuration. No suspicious mass. No intrahepatic or extrahepatic bile duct dilation. No calcified gallstones. Normal gallbladder wall thickness. No pericholecystic inflammatory changes. Pancreas: Unremarkable. No pancreatic ductal dilatation or surrounding inflammatory changes. Spleen: Within normal limits. No focal lesion. Adrenals/Urinary Tract: Adrenal glands are unremarkable. No suspicious renal mass. No hydronephrosis. There is a punctate nonobstructing calculus in the right kidney upper pole. No other renal or ureteric calculi. Unremarkable urinary bladder. Stomach/Bowel: No disproportionate dilation of the small or large bowel loops. No evidence of abnormal bowel wall thickening or inflammatory changes. The appendix is unremarkable. Vascular/Lymphatic: No ascites or pneumoperitoneum. No abdominal or pelvic lymphadenopathy, by size criteria. No aneurysmal dilation of the major abdominal arteries. Reproductive: Not well evaluated on the CT scan exam. However, having said that note is made of bulky retroverted uterus with intrauterine device. The cervix appears bulky however, not well evaluated on this exam. Correlate clinically to determine the need for additional imaging. Bilateral adnexa appears within normal limits. Other: There is a tiny fat containing umbilical hernia. Multiple venous collaterals noted in the subcutaneous tissue over the bilateral inguinal regions/lower anterior pelvis, nonspecific but typically seen as a sequela of chronic venous thrombosis of the upstream vein which are unfortunately not well evaluated on this phase of contrast. The soft tissues and abdominal wall are otherwise unremarkable. Musculoskeletal: No suspicious osseous lesions. IMPRESSION: 1. Redemonstration of multiple  enlarged left  axillary lymph nodes, concerning for metastases. No right axillary lymphadenopathy by size criteria. No mediastinal or hilar lymphadenopathy. No abdominal or pelvic lymphadenopathy by size criteria. 2. No other metastatic disease identified within the chest, abdomen or pelvis. 3. Multiple other nonacute observations, as described above. Electronically Signed   By: Ree Molt M.D.   On: 04/08/2024 08:35   MR BREAST BILATERAL W WO CONTRAST INC CAD Result Date: 04/04/2024 CLINICAL DATA:  Recent diagnosis of multifocal left breast malignancy and a metastatic left axillary lymph node. Assess extent of disease. EXAM: BILATERAL BREAST MRI WITH AND WITHOUT CONTRAST TECHNIQUE: Multiplanar, multisequence MR images of both breasts were obtained prior to and following the intravenous administration of 8 ml of Gadavist  Three-dimensional MR images were rendered by post-processing of the original MR data on an independent workstation. The three-dimensional MR images were interpreted, and findings are reported in the following complete MRI report for this study. Three dimensional images were evaluated at the independent interpreting workstation using the DynaCAD thin client. COMPARISON:  Diagnostic mammography and follow-up biopsy exams from 03/19/2024. FINDINGS: Breast composition: c. Heterogeneous fibroglandular tissue. Background parenchymal enhancement: Mild Right breast: There is an oval, circumscribed enhancing mass in the right breast measuring 9 mm in long axis, a portion of which demonstrates washout kinetics. This lies in the 7 o'clock retroareolar location, best appreciated on image 58, series 10. There are no other right breast masses or areas of abnormal enhancement. Left breast: Large irregular mass lies in the upper outer breast measuring approximately 4.4 x 2.2 x 3 cm, associated with the ribbon shaped post biopsy marker clip, which is best appreciated on image 70, series 3. Inferior to this mass is a second  smaller mass measuring 1.3 cm, associated with artifact from the coil shaped post biopsy marker clip, best appreciated on image 51, series 10. There is a small focus of enhancement associated with the heart shaped post biopsy marker clip, image 67, series 10, reflecting the biopsy proven fibroadenoma. There is low level enhancement that extends from the dominant mass to the skin surface of the anterior upper outer breast, best appreciated on image 73, series 10. This may reflect post biopsy change, which is suspected, with malignancy not excluded. There is significant skin thickening with associated increased T2 signal, but only minimal diffuse enhancement. This is consistent with reactive edema. There are no other left breast masses or areas of abnormal enhancement. Lymph nodes: No abnormal appearing left axillary lymph nodes. The biopsy proven metastatic left axillary lymph node is not included in the field of view. There are several adjacent prominent level 2 right axillary lymph nodes with cortical thickness is up to 4 mm. Ancillary findings:  None. IMPRESSION: 1. Biopsy-proven multifocal left breast malignancy. The dominant mass, associated with the ribbon shaped post biopsy marker clip, measures 4.4 x 2.2 x 3 cm. The second biopsy-proven carcinoma lies inferior to the dominant mass, associated with the coil shaped post biopsy marker clip, measuring 1.3 cm. 2. Low level abnormal enhancement extends from the dominant mass to the anterior, upper outer quadrant skin surface. This may reflect post biopsy reactive enhancement, but additional malignancy is not excluded. 3. More diffuse left breast skin thickening with associated increased T2 signal, but only minimal enhancement, is suspected to be reactive edema. 4. No other evidence of left breast malignancy. 5. Indeterminate enhancing 9 mm mass in the right breast, anterior lower outer quadrant, image 58, series 10. Tissue sampling is recommended. 6. Prominent  right axillary lymph nodes. RECOMMENDATION: 1. Targeted ultrasound of the right breast to attempt to localize the 9 mm mass in the anterior, lower outer quadrant. If this mass can be visualized sonographically, than ultrasound-guided core needle biopsy would be indicated. If this mass cannot be visualized sonographically, then MRI guided core needle biopsy would be indicated. 2. Recommend right axilla ultrasound as well to assess the prominent lymph node seen on MRI. BI-RADS CATEGORY  4: Suspicious. Electronically Signed   By: Alm Parkins M.D.   On: 04/04/2024 09:46   ECHOCARDIOGRAM COMPLETE Result Date: 04/03/2024    ECHOCARDIOGRAM REPORT   Patient Name:   EMBERLEIGH REILY Date of Exam: 04/03/2024 Medical Rec #:  [id number]        Height:       71.0 in Accession #:    [id number]       Weight:       177.9 lb Date of Birth:  1985-08-13       BSA:          2.006 m Patient Age:    38 years         BP:           146/90 mmHg Patient Gender: F                HR:           63 bpm. Exam Location:  Outpatient Procedure: 2D Echo, 3D Echo, Cardiac Doppler, Color Doppler and Strain Analysis            (Both Spectral and Color Flow Doppler were utilized during            procedure). Indications:    Z51.11 Encounter for antineoplastic chemotheraphy  History:        Patient has prior history of Echocardiogram examinations, most                 recent 08/31/2021. Risk Factors:Current Smoker. Breast cancer.  Sonographer:    Ellouise Mose RDCS Referring Phys: [id number] ONITA MATTOCK IMPRESSIONS  1. Left ventricular ejection fraction, by estimation, is 60 to 65%. Left ventricular ejection fraction by 3D volume is 56 %. The left ventricle has normal function. The left ventricle has no regional wall motion abnormalities. Left ventricular diastolic  parameters were normal. The average left ventricular global longitudinal strain is -19.0 %.  2. Right ventricular systolic function is normal. The right ventricular size is normal. There is normal  pulmonary artery systolic pressure. The estimated right ventricular systolic pressure is 21.7 mmHg.  3. The mitral valve is normal in structure. Trivial mitral valve regurgitation. No evidence of mitral stenosis.  4. Tricuspid valve regurgitation is mild to moderate.  5. The aortic valve is tricuspid. Aortic valve regurgitation is not visualized. No aortic stenosis is present.  6. The inferior vena cava is dilated in size with >50% respiratory variability, suggesting right atrial pressure of 8 mmHg. FINDINGS  Left Ventricle: Left ventricular ejection fraction, by estimation, is 60 to 65%. Left ventricular ejection fraction by 3D volume is 56 %. The left ventricle has normal function. The left ventricle has no regional wall motion abnormalities. The average left ventricular global longitudinal strain is -19.0 %. The left ventricular internal cavity size was normal in size. There is no left ventricular hypertrophy. Left ventricular diastolic parameters were normal. Right Ventricle: The right ventricular size is normal. No increase in right ventricular wall thickness. Right ventricular systolic function is normal. There is normal pulmonary artery  systolic pressure. The tricuspid regurgitant velocity is 1.85 m/s, and  with an assumed right atrial pressure of 8 mmHg, the estimated right ventricular systolic pressure is 21.7 mmHg. Left Atrium: Left atrial size was normal in size. Right Atrium: Right atrial size was normal in size. Pericardium: There is no evidence of pericardial effusion. Mitral Valve: The mitral valve is normal in structure. Trivial mitral valve regurgitation. No evidence of mitral valve stenosis. Tricuspid Valve: The tricuspid valve is normal in structure. Tricuspid valve regurgitation is mild to moderate. No evidence of tricuspid stenosis. Aortic Valve: The aortic valve is tricuspid. Aortic valve regurgitation is not visualized. No aortic stenosis is present. Pulmonic Valve: The pulmonic valve was  normal in structure. Pulmonic valve regurgitation is trivial. No evidence of pulmonic stenosis. Aorta: The aortic root is normal in size and structure. Venous: The inferior vena cava is dilated in size with greater than 50% respiratory variability, suggesting right atrial pressure of 8 mmHg. IAS/Shunts: There is redundancy of the interatrial septum. No atrial level shunt detected by color flow Doppler. Additional Comments: 3D was performed not requiring image post processing on an independent workstation and was normal.  LEFT VENTRICLE PLAX 2D LVIDd:         4.90 cm         Diastology LVIDs:         3.15 cm         LV e' medial:    8.70 cm/s LV PW:         1.00 cm         LV E/e' medial:  7.7 LV IVS:        0.90 cm         LV e' lateral:   15.40 cm/s LVOT diam:     2.30 cm         LV E/e' lateral: 4.4 LV SV:         95 LV SV Index:   47              2D Longitudinal LVOT Area:     4.15 cm        Strain                                2D Strain GLS   -19.0 %                                Avg: LV Volumes (MOD) LV vol d, MOD    112.5 ml      3D Volume EF A2C:                           LV 3D EF:    Left LV vol d, MOD    97.3 ml                    ventricul A4C:                                        ar LV vol s, MOD    45.8 ml                    ejection A2C:  fraction LV vol s, MOD    45.0 ml                    by 3D A4C:                                        volume is LV SV MOD A2C:   66.7 ml                    56 %. LV SV MOD A4C:   97.3 ml LV SV MOD BP:    58.7 ml                                3D Volume EF:                                3D EF:        56 %                                LV EDV:       168 ml                                LV ESV:       73 ml                                LV SV:        95 ml RIGHT VENTRICLE             IVC RV S prime:     13.80 cm/s  IVC diam: 2.60 cm TAPSE (M-mode): 1.5 cm                             PULMONARY VEINS                              Diastolic Velocity: 42.90 cm/s                             S/D Velocity:       1.50                             Systolic Velocity:  65.00 cm/s LEFT ATRIUM             Index        RIGHT ATRIUM           Index LA diam:        3.25 cm 1.62 cm/m   RA Area:     12.40 cm LA Vol (A2C):   43.8 ml 21.83 ml/m  RA Volume:   31.30 ml  15.60 ml/m LA Vol (A4C):   38.7 ml 19.29 ml/m LA Biplane Vol: 41.3 ml 20.59 ml/m  AORTIC VALVE LVOT Vmax:   113.00 cm/s LVOT Vmean:  73.200 cm/s LVOT VTI:    0.228 m  AORTA Ao Root diam: 3.10 cm Ao Asc diam:  3.30 cm MITRAL VALVE               TRICUSPID VALVE MV Area (PHT): 2.76 cm    TR Peak grad:   13.7 mmHg MV Decel Time: 275 msec    TR Vmax:        185.00 cm/s MV E velocity: 67.40 cm/s MV A velocity: 45.60 cm/s  SHUNTS MV E/A ratio:  1.48        Systemic VTI:  0.23 m                            Systemic Diam: 2.30 cm [name] [name] MD Electronically signed by [name] Balding MD Signature Date/Time: 04/03/2024/1:44:38 PM    Final    DG Chest 2 View Result Date: 04/03/2024 CLINICAL DATA:  Mid chest pain after port placed 3 days ago. EXAM: CHEST - 2 VIEW COMPARISON:  03/31/2024 FINDINGS: Right IJ Port-A-Cath has tip at the level of the cavoatrial junction. Lungs are adequately inflated without acute airspace process or effusion. Cardiomediastinal silhouette and remainder the exam is unchanged. IMPRESSION: 1. No acute cardiopulmonary disease. 2. Right IJ Port-A-Cath with tip at the level of the cavoatrial junction. Electronically Signed   By: Toribio Agreste M.D.   On: 04/03/2024 12:00   DG CHEST PORT 1 VIEW Result Date: 03/31/2024 EXAM: 1 VIEW(S) XRAY OF THE CHEST 03/31/2024 09:10:00 AM COMPARISON: 03/17/2024 CLINICAL HISTORY: 39 year old female with left breast cancer. Port-A-Cath placed. FINDINGS: LINES, TUBES AND DEVICES: Right IJ Port-A-Cath placement. Port-A-Cath tip at the cavoatrial junction level. LUNGS AND PLEURA: No focal pulmonary opacity. No pleural effusion. No  pneumothorax. HEART AND MEDIASTINUM: No acute abnormality of the cardiac and mediastinal silhouettes. BONES AND SOFT TISSUES: No acute osseous abnormality. IMPRESSION: 1. Right internal jugular Port-A-Cath with tip at the cavoatrial junction level. 2. No acute cardiopulmonary abnormality. Electronically signed by: Helayne Hurst MD 03/31/2024 09:29 AM EST RP Workstation: [id number]   DG C-Arm 1-60 Min-No Report Result Date: 03/31/2024 Fluoroscopy was utilized by the requesting physician.  No radiographic interpretation.   US  LT BREAST BX W LOC DEV 1ST LESION IMG BX SPEC US  GUIDE Addendum Date: 03/20/2024 ADDENDUM REPORT: 03/20/2024 11:21 ADDENDUM: Pathology revealed: Site 1. 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 LYMPHOVASCULAR INVASION: NOT IDENTIFIED. CANCER LENGTH: 11 MM / 1.1 CM. CALCIFICATIONS: NOT IDENTIFIED. Site 2. Breast, left 4 o'clock (coil clip) INTRAMAMMARY LYMPH NODE, INVOLVED BY INVASIVE DUCTAL CARCINOMA (1/1). CANCER LENGTH: 6 MM / 0.6 CM. Site 3. 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 NEGATIVE FOR ATYPIA OR MALIGNANCY. Site 4. 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 METASTATIC FOCUS: 5 MM /0.5 CM. All sites were found to be concordant by Dr. Madeleine Satterfield. RECOMMENDATION: 1. Breast MRI to evaluate extent of disease. 2. Surgical and oncological consultation. The patient was referred to [facility] with appointment on Dec. 10, 2025. Pathology results were discussed with the patient by telephone. The patient reported doing well after the biopsy with tenderness at the site. Post biopsy instructions and care were reviewed and questions were answered. The patient was encouraged to call [facility] for any additional concerns. Addendum dictated by Conard Billing R.T. (R)(M) Electronically  Signed   By: Debby Satterfield M.D.   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 Dominant 3.5 cm mass with calcifications at 2 o'clock. Satellite 1.3 cm mass at 4 o'clock. Adjacent satellite masses at 12 o'clock, the largest measuring 1.1 cm. At least 2 abnormal LEFT axillary lymph nodes. EXAM: ULTRASOUND GUIDED LEFT BREAST CORE NEEDLE BIOPSY x 3 ULTRASOUND GUIDED LEFT AXILLARY LYMPH NODE CORE NEEDLE BIOPSY COMPARISON:  Previous exam(s). PROCEDURE: I met with the patient and we discussed the procedure of ultrasound-guided biopsy, including benefits and alternatives. We discussed the high likelihood of a successful procedure. We discussed the risks of the procedure, including infection, bleeding, tissue injury, clip migration, and inadequate sampling. Informed written consent was given. The usual time-out protocol was performed immediately prior to the procedure. #1) Mass, 2 o'clock, lesion quadrant: UPPER OUTER QUADRANT. Using sterile technique with chlorhexidine  as skin antisepsis, 1% lidocaine  and 1% lidocaine  with epinephrine  as local anesthetic, under direct ultrasound visualization, a 12 gauge Bard Marquee core needle device placed through an 11 gauge introducer needle was used to perform biopsy of the mass at 2 o'clock using a lateral approach. At the conclusion of the procedure, a ribbon shaped tissue marker clip was deployed into the biopsy cavity. # 2) Mass, 4 o'clock, lesion quadrant: LOWER OUTER QUADRANT. Using sterile technique with chlorhexidine  as skin antisepsis, 1% lidocaine  and 1% lidocaine  with epinephrine  as local anesthetic, under direct ultrasound visualization, a 14 gauge Bard Marquee core needle device placed through a 13 gauge introducer needle was used perform biopsy of the mass at 4 o'clock using a lateral approach. At the conclusion of the procedure, a coil shaped  tissue marker clip was deployed into the biopsy cavity. # 3) Mass, 12 o'clock location: Using sterile technique with chlorhexidine  as skin antisepsis, 1% lidocaine  and 1% lidocaine  with epinephrine  as local anesthetic, under direct ultrasound visualization, a 14 gauge Bard Marquee core needle device placed through a 13 gauge introducer needle was used perform biopsy of the mass at 12 o'clock using a lateral approach. At the conclusion of the procedure, a heart shaped tissue marker clip was deployed into the biopsy cavity. # 4) Axillary lymph node: Using sterile technique with chlorhexidine  as skin antisepsis, 1% lidocaine  and 1% lidocaine  with epinephrine  as local anesthetic, under direct ultrasound visualization, a 14 gauge Bard Marquee core needle device placed through a 13 gauge introducer needle was used perform biopsy of the abnormal lymph node using an inferolateral approach. At the conclusion of the procedure, a Venus shaped tissue marker clip was deployed into the biopsy cavity. The patient tolerated the procedures well without apparent immediate complications. Follow up 2 view mammogram was performed in order to confirm clip placement and was dictated separately. IMPRESSION: 1. Ultrasound-guided core needle biopsy of a dominant mass in the UPPER OUTER QUADRANT of the LEFT breast at 2 o'clock. 2. Ultrasound-guided core needle biopsy of a satellite mass in the LOWER OUTER QUADRANT of the LEFT breast at 4 o'clock. 3. Ultrasound-guided core needle biopsy of a satellite mass in the upper retroareolar LEFT breast at 12 o'clock. 4. Ultrasound-guided core needle biopsy of an abnormal LEFT axillary lymph node. Electronically Signed: By: Debby Satterfield M.D. On: 03/19/2024 17:17   US  LT BREAST BX W LOC DEV EA ADD LESION IMG BX SPEC US  GUIDE Addendum Date: 03/20/2024 ADDENDUM REPORT: 03/20/2024 11:21 ADDENDUM: Pathology revealed: Site 1. Breast, left 2 o'clock (ribbon clip) INVASIVE DUCTAL CARCINOMA OVERALL GRADE:  2. LYMPHOVASCULAR INVASION:  NOT IDENTIFIED. CANCER LENGTH: 11 MM / 1.1 CM. CALCIFICATIONS: NOT IDENTIFIED. Site 2. Breast, left 4 o'clock (coil clip) INTRAMAMMARY LYMPH NODE, INVOLVED BY INVASIVE DUCTAL CARCINOMA (1/1). CANCER LENGTH: 6 MM / 0.6 CM. Site 3. 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 NEGATIVE FOR ATYPIA OR MALIGNANCY. Site 4. 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 METASTATIC FOCUS: 5 MM /0.5 CM. All sites were found to be concordant by Dr. Madeleine Satterfield. RECOMMENDATION: 1. Breast MRI to evaluate extent of disease. 2. Surgical and oncological consultation. The patient was referred to [facility] with appointment on Dec. 10, 2025. Pathology results were discussed with the patient by telephone. The patient reported doing well after the biopsy with tenderness at the site. Post biopsy instructions and care were reviewed and questions were answered. The patient was encouraged to call [facility] for any additional concerns. Addendum dictated by Conard Billing R.T. (R)(M) Electronically Signed   By: Debby Satterfield M.D.   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 Dominant 3.5 cm mass with calcifications at 2 o'clock. Satellite 1.3 cm mass at 4 o'clock. Adjacent satellite masses at 12 o'clock, the largest measuring 1.1 cm. At least 2 abnormal LEFT axillary lymph nodes. EXAM: ULTRASOUND GUIDED LEFT BREAST CORE NEEDLE BIOPSY x 3 ULTRASOUND GUIDED LEFT AXILLARY LYMPH NODE CORE NEEDLE BIOPSY COMPARISON:  Previous exam(s). PROCEDURE: I met with the patient and we discussed the procedure of ultrasound-guided biopsy, including benefits and alternatives. We discussed the high likelihood of a successful procedure. We discussed  the risks of the procedure, including infection, bleeding, tissue injury, clip migration, and inadequate sampling. Informed written consent was given. The usual time-out protocol was performed immediately prior to the procedure. #1) Mass, 2 o'clock, lesion quadrant: UPPER OUTER QUADRANT. Using sterile technique with chlorhexidine  as skin antisepsis, 1% lidocaine  and 1% lidocaine  with epinephrine  as local anesthetic, under direct ultrasound visualization, a 12 gauge Bard Marquee core needle device placed through an 11 gauge introducer needle was used to perform biopsy of the mass at 2 o'clock using a lateral approach. At the conclusion of the procedure, a ribbon shaped tissue marker clip was deployed into the biopsy cavity. # 2) Mass, 4 o'clock, lesion quadrant: LOWER OUTER QUADRANT. Using sterile technique with chlorhexidine  as skin antisepsis, 1% lidocaine  and 1% lidocaine  with epinephrine  as local anesthetic, under direct ultrasound visualization, a 14 gauge Bard Marquee core needle device placed through a 13 gauge introducer needle was used perform biopsy of the mass at 4 o'clock using a lateral approach. At the conclusion of the procedure, a coil shaped tissue marker clip was deployed into the biopsy cavity. # 3) Mass, 12 o'clock location: Using sterile technique with chlorhexidine  as skin antisepsis, 1% lidocaine  and 1% lidocaine  with epinephrine  as local anesthetic, under direct ultrasound visualization, a 14 gauge Bard Marquee core needle device placed through a 13 gauge introducer needle was used perform biopsy of the mass at 12 o'clock using a lateral approach. At the conclusion of the procedure, a heart shaped tissue marker clip was deployed into the biopsy cavity. # 4) Axillary lymph node: Using sterile technique with chlorhexidine  as skin antisepsis, 1% lidocaine  and 1% lidocaine  with epinephrine  as local anesthetic, under direct ultrasound visualization, a 14 gauge Bard Marquee core needle device placed  through  a 13 gauge introducer needle was used perform biopsy of the abnormal lymph node using an inferolateral approach. At the conclusion of the procedure, a Venus shaped tissue marker clip was deployed into the biopsy cavity. The patient tolerated the procedures well without apparent immediate complications. Follow up 2 view mammogram was performed in order to confirm clip placement and was dictated separately. IMPRESSION: 1. Ultrasound-guided core needle biopsy of a dominant mass in the UPPER OUTER QUADRANT of the LEFT breast at 2 o'clock. 2. Ultrasound-guided core needle biopsy of a satellite mass in the LOWER OUTER QUADRANT of the LEFT breast at 4 o'clock. 3. Ultrasound-guided core needle biopsy of a satellite mass in the upper retroareolar LEFT breast at 12 o'clock. 4. Ultrasound-guided core needle biopsy of an abnormal LEFT axillary lymph node. Electronically Signed: By: Debby Satterfield M.D. On: 03/19/2024 17:17   US  LT BREAST BX W LOC DEV EA ADD LESION IMG BX SPEC US  GUIDE Addendum Date: 03/20/2024 ADDENDUM REPORT: 03/20/2024 11:21 ADDENDUM: Pathology revealed: Site 1. 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 LYMPHOVASCULAR INVASION: NOT IDENTIFIED. CANCER LENGTH: 11 MM / 1.1 CM. CALCIFICATIONS: NOT IDENTIFIED. Site 2. Breast, left 4 o'clock (coil clip) INTRAMAMMARY LYMPH NODE, INVOLVED BY INVASIVE DUCTAL CARCINOMA (1/1). CANCER LENGTH: 6 MM / 0.6 CM. Site 3. 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 NEGATIVE FOR ATYPIA OR MALIGNANCY. Site 4. 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 METASTATIC FOCUS: 5 MM /0.5 CM. All sites were found to be concordant by Dr. Madeleine Satterfield. RECOMMENDATION: 1. Breast MRI to evaluate extent of disease. 2. Surgical and oncological consultation. The patient was referred to [facility] with  appointment on Dec. 10, 2025. Pathology results were discussed with the patient by telephone. The patient reported doing well after the biopsy with tenderness at the site. Post biopsy instructions and care were reviewed and questions were answered. The patient was encouraged to call [facility] for any additional concerns. Addendum dictated by Conard Billing R.T. (R)(M) Electronically Signed   By: Debby Satterfield M.D.   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 Dominant 3.5 cm mass with calcifications at 2 o'clock. Satellite 1.3 cm mass at 4 o'clock. Adjacent satellite masses at 12 o'clock, the largest measuring 1.1 cm. At least 2 abnormal LEFT axillary lymph nodes. EXAM: ULTRASOUND GUIDED LEFT BREAST CORE NEEDLE BIOPSY x 3 ULTRASOUND GUIDED LEFT AXILLARY LYMPH NODE CORE NEEDLE BIOPSY COMPARISON:  Previous exam(s). PROCEDURE: I met with the patient and we discussed the procedure of ultrasound-guided biopsy, including benefits and alternatives. We discussed the high likelihood of a successful procedure. We discussed the risks of the procedure, including infection, bleeding, tissue injury, clip migration, and inadequate sampling. Informed written consent was given. The usual time-out protocol was performed immediately prior to the procedure. #1) Mass, 2 o'clock, lesion quadrant: UPPER OUTER QUADRANT. Using sterile technique with chlorhexidine  as skin antisepsis, 1% lidocaine  and 1% lidocaine  with epinephrine  as local anesthetic, under direct ultrasound visualization, a 12 gauge Bard Marquee core needle device placed through an 11 gauge introducer needle was used to perform biopsy of the mass at 2 o'clock using a lateral approach. At the conclusion of the procedure, a ribbon shaped tissue marker clip was deployed into the biopsy cavity. # 2) Mass,  4 o'clock, lesion  quadrant: LOWER OUTER QUADRANT. Using sterile technique with chlorhexidine  as skin antisepsis, 1% lidocaine  and 1% lidocaine  with epinephrine  as local anesthetic, under direct ultrasound visualization, a 14 gauge Bard Marquee core needle device placed through a 13 gauge introducer needle was used perform biopsy of the mass at 4 o'clock using a lateral approach. At the conclusion of the procedure, a coil shaped tissue marker clip was deployed into the biopsy cavity. # 3) Mass, 12 o'clock location: Using sterile technique with chlorhexidine  as skin antisepsis, 1% lidocaine  and 1% lidocaine  with epinephrine  as local anesthetic, under direct ultrasound visualization, a 14 gauge Bard Marquee core needle device placed through a 13 gauge introducer needle was used perform biopsy of the mass at 12 o'clock using a lateral approach. At the conclusion of the procedure, a heart shaped tissue marker clip was deployed into the biopsy cavity. # 4) Axillary lymph node: Using sterile technique with chlorhexidine  as skin antisepsis, 1% lidocaine  and 1% lidocaine  with epinephrine  as local anesthetic, under direct ultrasound visualization, a 14 gauge Bard Marquee core needle device placed through a 13 gauge introducer needle was used perform biopsy of the abnormal lymph node using an inferolateral approach. At the conclusion of the procedure, a Venus shaped tissue marker clip was deployed into the biopsy cavity. The patient tolerated the procedures well without apparent immediate complications. Follow up 2 view mammogram was performed in order to confirm clip placement and was dictated separately. IMPRESSION: 1. Ultrasound-guided core needle biopsy of a dominant mass in the UPPER OUTER QUADRANT of the LEFT breast at 2 o'clock. 2. Ultrasound-guided core needle biopsy of a satellite mass in the LOWER OUTER QUADRANT of the LEFT breast at 4 o'clock. 3. Ultrasound-guided core needle biopsy of a satellite mass in the upper retroareolar LEFT  breast at 12 o'clock. 4. Ultrasound-guided core needle biopsy of an abnormal LEFT axillary lymph node. Electronically Signed: By: Debby Satterfield M.D. On: 03/19/2024 17:17   US  AXILLARY NODE CORE BIOPSY LEFT Addendum Date: 03/20/2024 ADDENDUM REPORT: 03/20/2024 11:21 ADDENDUM: Pathology revealed: Site 1. 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 LYMPHOVASCULAR INVASION: NOT IDENTIFIED. CANCER LENGTH: 11 MM / 1.1 CM. CALCIFICATIONS: NOT IDENTIFIED. Site 2. Breast, left 4 o'clock (coil clip) INTRAMAMMARY LYMPH NODE, INVOLVED BY INVASIVE DUCTAL CARCINOMA (1/1). CANCER LENGTH: 6 MM / 0.6 CM. Site 3. 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 NEGATIVE FOR ATYPIA OR MALIGNANCY. Site 4. 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 METASTATIC FOCUS: 5 MM /0.5 CM. All sites were found to be concordant by Dr. Madeleine Satterfield. RECOMMENDATION: 1. Breast MRI to evaluate extent of disease. 2. Surgical and oncological consultation. The patient was referred to [facility] with appointment on Dec. 10, 2025. Pathology results were discussed with the patient by telephone. The patient reported doing well after the biopsy with tenderness at the site. Post biopsy instructions and care were reviewed and questions were answered. The patient was encouraged to call [facility] for any additional concerns. Addendum dictated by Conard Billing R.T. (R)(M) Electronically Signed   By: Debby Satterfield M.D.   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 Dominant 3.5 cm mass with calcifications at 2 o'clock. Satellite 1.3 cm mass at 4 o'clock. Adjacent satellite masses at  12 o'clock, the largest measuring 1.1 cm. At least 2  abnormal LEFT axillary lymph nodes. EXAM: ULTRASOUND GUIDED LEFT BREAST CORE NEEDLE BIOPSY x 3 ULTRASOUND GUIDED LEFT AXILLARY LYMPH NODE CORE NEEDLE BIOPSY COMPARISON:  Previous exam(s). PROCEDURE: I met with the patient and we discussed the procedure of ultrasound-guided biopsy, including benefits and alternatives. We discussed the high likelihood of a successful procedure. We discussed the risks of the procedure, including infection, bleeding, tissue injury, clip migration, and inadequate sampling. Informed written consent was given. The usual time-out protocol was performed immediately prior to the procedure. #1) Mass, 2 o'clock, lesion quadrant: UPPER OUTER QUADRANT. Using sterile technique with chlorhexidine  as skin antisepsis, 1% lidocaine  and 1% lidocaine  with epinephrine  as local anesthetic, under direct ultrasound visualization, a 12 gauge Bard Marquee core needle device placed through an 11 gauge introducer needle was used to perform biopsy of the mass at 2 o'clock using a lateral approach. At the conclusion of the procedure, a ribbon shaped tissue marker clip was deployed into the biopsy cavity. # 2) Mass, 4 o'clock, lesion quadrant: LOWER OUTER QUADRANT. Using sterile technique with chlorhexidine  as skin antisepsis, 1% lidocaine  and 1% lidocaine  with epinephrine  as local anesthetic, under direct ultrasound visualization, a 14 gauge Bard Marquee core needle device placed through a 13 gauge introducer needle was used perform biopsy of the mass at 4 o'clock using a lateral approach. At the conclusion of the procedure, a coil shaped tissue marker clip was deployed into the biopsy cavity. # 3) Mass, 12 o'clock location: Using sterile technique with chlorhexidine  as skin antisepsis, 1% lidocaine  and 1% lidocaine  with epinephrine  as local anesthetic, under direct ultrasound visualization, a 14 gauge Bard Marquee core needle device placed through a 13 gauge introducer needle was used perform biopsy of the mass  at 12 o'clock using a lateral approach. At the conclusion of the procedure, a heart shaped tissue marker clip was deployed into the biopsy cavity. # 4) Axillary lymph node: Using sterile technique with chlorhexidine  as skin antisepsis, 1% lidocaine  and 1% lidocaine  with epinephrine  as local anesthetic, under direct ultrasound visualization, a 14 gauge Bard Marquee core needle device placed through a 13 gauge introducer needle was used perform biopsy of the abnormal lymph node using an inferolateral approach. At the conclusion of the procedure, a Venus shaped tissue marker clip was deployed into the biopsy cavity. The patient tolerated the procedures well without apparent immediate complications. Follow up 2 view mammogram was performed in order to confirm clip placement and was dictated separately. IMPRESSION: 1. Ultrasound-guided core needle biopsy of a dominant mass in the UPPER OUTER QUADRANT of the LEFT breast at 2 o'clock. 2. Ultrasound-guided core needle biopsy of a satellite mass in the LOWER OUTER QUADRANT of the LEFT breast at 4 o'clock. 3. Ultrasound-guided core needle biopsy of a satellite mass in the upper retroareolar LEFT breast at 12 o'clock. 4. Ultrasound-guided core needle biopsy of an abnormal LEFT axillary lymph node. Electronically Signed: By: Debby Satterfield M.D. On: 03/19/2024 17:17   MM CLIP PLACEMENT LEFT Result Date: 03/19/2024 CLINICAL DATA:  Confirmation of clip placement after ultrasound-guided core needle biopsy of 3 LEFT breast masses and an abnormal LEFT axillary lymph node. EXAM: 2D and 3D DIAGNOSTIC LEFT MAMMOGRAM POST ULTRASOUND BIOPSY COMPARISON:  Previous exam(s). ACR Breast Density Category c: The breasts are heterogeneously dense, which may obscure small masses. FINDINGS: 2D and 3D full field CC, mediolateral and MLO mammographic images were obtained following ultrasound guided biopsy of 3 LEFT breast  masses and an abnormal LEFT axillary lymph node. The ribbon shaped tissue  marking clip is appropriately positioned at the anterior superior margin of the biopsied dominant mass with calcifications in the UPPER OUTER QUADRANT of the LEFT breast at 2 o'clock. The coil shaped tissue marking clip is appropriately positioned at the site of the biopsied satellite mass in the LOWER OUTER QUADRANT of the LEFT breast at 4 o'clock. The heart shaped tissue marking clip is appropriately positioned at the site of the biopsied satellite mass at the 12 o'clock retroareolar location at middle depth. Of note, the biopsied mass was not the mass identified in the central breast on mammography. The ribbon clip in the heart clip are separated by approximately 5 cm. The Venus shaped tissue marking clip placed in the biopsied LEFT axillary lymph node is not visible due to the deep location of the node. IMPRESSION: 1. Appropriate positioning of the ribbon shaped tissue marking clip at the site of the biopsied dominant mass in the UPPER OUTER QUADRANT of the LEFT breast. 2. Appropriate positioning of the coil shaped tissue marking clip at the site of the biopsied satellite mass in the LOWER OUTER QUADRANT of the LEFT breast. 3. Appropriate positioning of the heart shaped tissue marking clip at the site of the biopsied satellite mass in the retroareolar breast at middle depth. Of note, this mass is not the mass that was identified on mammography in the central breast. 4. The Venus shaped tissue marking clip placed into the biopsied abnormal LEFT axillary lymph node was not visible due to its depth. Final Assessment: Post Procedure Mammograms for Marker Placement Electronically Signed   By: Debby Satterfield M.D.   On: 03/19/2024 17:21   MM 3D DIAGNOSTIC MAMMOGRAM BILATERAL BREAST Result Date: 03/19/2024 CLINICAL DATA:  39 year old presenting with a 2 week history of a palpable lump in the outer LEFT breast associated with skin erythema. The patient recently started antibiotic therapy. This is the patient's  initial baseline mammogram. EXAM: DIGITAL DIAGNOSTIC BILATERAL MAMMOGRAM WITH TOMOSYNTHESIS AND CAD; ULTRASOUND LEFT BREAST LIMITED TECHNIQUE: Bilateral digital diagnostic mammography and breast tomosynthesis was performed. The images were evaluated with computer-aided detection. ; Targeted ultrasound examination of the left breast was performed. COMPARISON:  None. ACR Breast Density Category c: The breasts are heterogeneously dense, which may obscure small masses. FINDINGS: Full field CC and MLO views of both breasts and a spot tangential view of the palpable concern in the LEFT breast were obtained. RIGHT: No findings suspicious malignancy. LEFT: Focal asymmetry and/or mass with associated architectural distortion and suspicious calcifications involving the outer breast at posterior depth. The suspicious calcifications span approximately 3 cm. Superficial isodense mass in the outer breast at middle depth, most conspicuous on the CC view, measuring just over 1 cm in size. Isodense mass in the central breast, directly behind the nipple at middle depth, measuring approximately 0.6 cm. These masses are not associated with architectural distortion or suspicious calcifications. Skin thickening involving the lower and outer breast. Targeted ultrasound is performed, demonstrating multiple irregular hypoechoic masses including: -Dominant palpable mass at 2 o'clock 6 cm from the nipple measuring approximately 3.5 x 1.6 x 2.6 cm, demonstrating posterior acoustic shadowing and demonstrating internal power Doppler flow. -Satellite mass at 4 o'clock 4 cm from the nipple measuring approximately 1.3 x 1.1 x 1.2 cm, demonstrating no posterior characteristics and demonstrating internal power Doppler flow. -Adjacent satellite masses at the 12 o'clock subareolar location at middle depth (central breast), the larger and more superficial  of which measures approximately 1.1 x 0.5 x 0.5 cm, and the smaller of which measures approximately  0.5 x 0.5 x 0.4 cm, both demonstrating no posterior characteristics and no internal power Doppler flow. Sonographic evaluation of the axilla demonstrates at least 2 pathologic lymph nodes. The largest node measures 2.5 cm in length and has diffuse cortical thickening up to 0.7 cm. On correlative physical examination, there is a firm palpable 3-4 cm mass in the UPPER OUTER QUADRANT corresponding to the dominant mass. There is a firm palpable 1 cm mass in the LOWER OUTER QUADRANT corresponding to the satellite mass at 4 o'clock. There is erythema involving the skin of the outer breast and the lower breast. IMPRESSION: 1. 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 2. No mammographic evidence of malignancy involving the RIGHT breast. RECOMMENDATION: Ultrasound-guided core needle biopsies of the dominant mass in the UPPER OUTER QUADRANT of the LEFT breast, the satellite mass in the LOWER OUTER QUADRANT, the larger of the adjacent satellite masses in the central breast, and the largest abnormal LEFT axillary lymph node. I have discussed the findings and recommendations with the patient. The ultrasound core needle biopsy procedure was discussed with the patient and her questions were answered. She wishes to proceed with the biopsies which will be performed subsequently and reported separately. BI-RADS CATEGORY  5: Highly suggestive of malignancy. Electronically Signed   By: Debby Satterfield M.D.   On: 03/19/2024 15:59   US  LIMITED ULTRASOUND INCLUDING AXILLA LEFT BREAST  Result Date: 03/19/2024 CLINICAL DATA:  38 year old presenting with a 2 week history of a palpable lump in the outer LEFT breast associated with skin erythema. The patient recently started antibiotic therapy. This is the patient's initial baseline mammogram. EXAM: DIGITAL DIAGNOSTIC BILATERAL MAMMOGRAM WITH TOMOSYNTHESIS AND CAD; ULTRASOUND LEFT  BREAST LIMITED TECHNIQUE: Bilateral digital diagnostic mammography and breast tomosynthesis was performed. The images were evaluated with computer-aided detection. ; Targeted ultrasound examination of the left breast was performed. COMPARISON:  None. ACR Breast Density Category c: The breasts are heterogeneously dense, which may obscure small masses. FINDINGS: Full field CC and MLO views of both breasts and a spot tangential view of the palpable concern in the LEFT breast were obtained. RIGHT: No findings suspicious malignancy. LEFT: Focal asymmetry and/or mass with associated architectural distortion and suspicious calcifications involving the outer breast at posterior depth. The suspicious calcifications span approximately 3 cm. Superficial isodense mass in the outer breast at middle depth, most conspicuous on the CC view, measuring just over 1 cm in size. Isodense mass in the central breast, directly behind the nipple at middle depth, measuring approximately 0.6 cm. These masses are not associated with architectural distortion or suspicious calcifications. Skin thickening involving the lower and outer breast. Targeted ultrasound is performed, demonstrating multiple irregular hypoechoic masses including: -Dominant palpable mass at 2 o'clock 6 cm from the nipple measuring approximately 3.5 x 1.6 x 2.6 cm, demonstrating posterior acoustic shadowing and demonstrating internal power Doppler flow. -Satellite mass at 4 o'clock 4 cm from the nipple measuring approximately 1.3 x 1.1 x 1.2 cm, demonstrating no posterior characteristics and demonstrating internal power Doppler flow. -Adjacent satellite masses at the 12 o'clock subareolar location at middle depth (central breast), the larger and more superficial of which measures approximately 1.1 x 0.5 x 0.5 cm, and the smaller of which measures approximately 0.5 x 0.5 x 0.4 cm, both demonstrating no posterior characteristics and no internal  power Doppler flow. Sonographic  evaluation of the axilla demonstrates at least 2 pathologic lymph nodes. The largest node measures 2.5 cm in length and has diffuse cortical thickening up to 0.7 cm. On correlative physical examination, there is a firm palpable 3-4 cm mass in the UPPER OUTER QUADRANT corresponding to the dominant mass. There is a firm palpable 1 cm mass in the LOWER OUTER QUADRANT corresponding to the satellite mass at 4 o'clock. There is erythema involving the skin of the outer breast and the lower breast. IMPRESSION: 1. 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 2. No mammographic evidence of malignancy involving the RIGHT breast. RECOMMENDATION: Ultrasound-guided core needle biopsies of the dominant mass in the UPPER OUTER QUADRANT of the LEFT breast, the satellite mass in the LOWER OUTER QUADRANT, the larger of the adjacent satellite masses in the central breast, and the largest abnormal LEFT axillary lymph node. I have discussed the findings and recommendations with the patient. The ultrasound core needle biopsy procedure was discussed with the patient and her questions were answered. She wishes to proceed with the biopsies which will be performed subsequently and reported separately. BI-RADS CATEGORY  5: Highly suggestive of malignancy. Electronically Signed   By: Debby Satterfield M.D.   On: 03/19/2024 15:59   (Echo, Carotid, EGD, Colonoscopy, ERCP)    Subjective:  Feels better ambulating in hallway decreased swelling and redness to the right side of the neck noted Discharge Exam: Vitals:   04/17/24 2050 04/18/24 0440  BP: 109/77 108/70  Pulse: 83 75  Resp: 18 18  Temp: 99 F (37.2 C) 98.1 F (36.7 C)  SpO2: 99% 98%   Vitals:   04/17/24 0507 04/17/24 1329 04/17/24 2050 04/18/24 0440  BP: (!) 92/53 129/73 109/77 108/70  Pulse: 79 93 83 75  Resp: 16 16 18 18   Temp: 98.6 F (37 C) 98.1 F (36.7 C) 99  F (37.2 C) 98.1 F (36.7 C)  TempSrc: Oral Oral Oral Oral  SpO2: 98% 97% 99% 98%    General: Pt is alert, awake, not in acute distress Cardiovascular: RRR, S1/S2 +, no rubs, no gallops Respiratory: CTA bilaterally, no wheezing, no rhonchi Abdominal: Soft, NT, ND, bowel sounds + Extremities: no edema, no cyanosis    The results of significant diagnostics from this hospitalization (including imaging, microbiology, ancillary and laboratory) are listed below for reference.     Microbiology: Recent Results (from the past 240 hours)  Culture, blood (Routine X 2) w Reflex to ID Panel     Status: None (Preliminary result)   Collection Time: 04/16/24  4:05 PM   Specimen: BLOOD RIGHT HAND  Result Value Ref Range Status   Specimen Description   Final    BLOOD RIGHT HAND Performed at [facility], [address], [place], [place] [address]    Special Requests   Final    BOTTLES DRAWN AEROBIC AND ANAEROBIC Blood Culture adequate volume Performed at [facility], [address] [place], [place] [address]    Culture   Final    NO GROWTH 2 DAYS Performed at [facility], [address], [place], [place] [address]    Report Status PENDING  Incomplete  Culture, blood (Routine X 2) w Reflex to ID Panel     Status: None (Preliminary result)   Collection Time: 04/16/24  4:05 PM   Specimen: BLOOD RIGHT ARM  Result Value Ref Range  Status   Specimen Description   Final    BLOOD RIGHT ARM Performed at [facility], [address], [place], [place] [address]    Special Requests   Final    BOTTLES DRAWN AEROBIC ONLY Blood Culture adequate volume Performed at [facility], [address], [place], [place] [address]    Culture   Final    NO GROWTH 2 DAYS Performed at [facility], [address], [place], [place] [address]    Report Status PENDING  Incomplete     Labs: BNP (last 3 results) No  results for input(s): BNP in the last 8760 hours. Basic Metabolic Panel: Recent Labs  Lab 04/15/24 0237 04/16/24 0508  NA 138 138  K 3.9 4.5  CL 103 104  CO2 25 25  GLUCOSE 99 106*  BUN 8 9  CREATININE 0.75 0.82  CALCIUM 9.2 9.4   Liver Function Tests: Recent Labs  Lab 04/15/24 0237  AST 15  ALT 18  ALKPHOS 84  BILITOT 0.4  PROT 6.3*  ALBUMIN 4.0   No results for input(s): LIPASE, AMYLASE in the last 168 hours. No results for input(s): AMMONIA in the last 168 hours. CBC: Recent Labs  Lab 04/15/24 0237 04/15/24 1315 04/16/24 0508 04/17/24 0651 04/18/24 0727  WBC 5.2 4.4 3.9* 5.9 8.7  NEUTROABS 3.6  --   --   --   --   HGB 12.5 12.2 12.4 12.8 13.7  HCT 36.7 36.2 36.8 38.0 42.1  MCV 83.8 82.8 83.4 83.7 84.7  PLT 87* 78* 84* 99* 122*   Cardiac Enzymes: No results for input(s): CKTOTAL, CKMB, CKMBINDEX, TROPONINI in the last 168 hours. BNP: Invalid input(s): POCBNP CBG: No results for input(s): GLUCAP in the last 168 hours. D-Dimer No results for input(s): DDIMER in the last 72 hours. Hgb A1c No results for input(s): HGBA1C in the last 72 hours. Lipid Profile No results for input(s): CHOL, HDL, LDLCALC, TRIG, CHOLHDL, LDLDIRECT in the last 72 hours. Thyroid function studies No results for input(s): TSH, T4TOTAL, T3FREE, THYROIDAB in the last 72 hours.  Invalid input(s): FREET3 Anemia work up No results for input(s): VITAMINB12, FOLATE, FERRITIN, TIBC, IRON, RETICCTPCT in the last 72 hours. Urinalysis    Component Value Date/Time   COLORURINE YELLOW 09/14/2023 1327   APPEARANCEUR CLEAR 09/14/2023 1327   LABSPEC 1.016 09/14/2023 1327   PHURINE 6.0 09/14/2023 1327   GLUCOSEU NEGATIVE 09/14/2023 1327   HGBUR NEGATIVE 09/14/2023 1327   BILIRUBINUR NEGATIVE 09/14/2023 1327   BILIRUBINUR negative 09/13/2023 1603   KETONESUR NEGATIVE 09/14/2023 1327   PROTEINUR NEGATIVE 09/14/2023 1327    UROBILINOGEN 0.2 09/13/2023 1603   UROBILINOGEN 1.0 03/01/2008 1415   NITRITE NEGATIVE 09/14/2023 1327   LEUKOCYTESUR LARGE (A) 09/14/2023 1327   Sepsis Labs Recent Labs  Lab 04/15/24 1315 04/16/24 0508 04/17/24 0651 04/18/24 0727  WBC 4.4 3.9* 5.9 8.7   Microbiology Recent Results (from the past 240 hours)  Culture, blood (Routine X 2) w Reflex to ID Panel     Status: None (Preliminary result)   Collection Time: 04/16/24  4:05 PM   Specimen: BLOOD RIGHT HAND  Result Value Ref Range Status   Specimen Description   Final    BLOOD RIGHT HAND Performed at [facility], [address], [place], [place] [address]    Special Requests   Final    BOTTLES DRAWN AEROBIC AND ANAEROBIC Blood Culture adequate volume Performed at [facility],  [address], [place], [place] [address]    Culture   Final    NO GROWTH 2 DAYS Performed at [facility], [address] [place], [place] [address]    Report Status PENDING  Incomplete  Culture, blood (Routine X 2) w Reflex to ID Panel     Status: None (Preliminary result)   Collection Time: 04/16/24  4:05 PM   Specimen: BLOOD RIGHT ARM  Result Value Ref Range Status   Specimen Description   Final    BLOOD RIGHT ARM Performed at [facility], [address], [place], [place] [address]    Special Requests   Final    BOTTLES DRAWN AEROBIC ONLY Blood Culture adequate volume Performed at [facility], [address], [place], [place] [address]    Culture   Final    NO GROWTH 2 DAYS Performed at [facility], [address] [place], [place] [address]    Report Status PENDING  Incomplete     Time coordinating discharge: 40 min SIGNED:   Almarie KANDICE Hoots, MD  [facility] 04/18/2024, 2:18 PM     [1]  Allergies Allergen Reactions   Nsaids Other (See Comments)    Eliquis 

## 2024-04-18 NOTE — Telephone Encounter (Signed)
 Patient Product/process Development Scientist completed.    The patient is insured through [facility] MEDICAID.     Ran test claim for Pradaxa 150 mg and the current 30 day co-pay is $4.00.  Ran test claim for enoxaparin  (Lovenox ) 100 mg/ml mg and the current 30 day co-pay is $4.00.   This test claim was processed through [facility]- copay amounts may vary at other pharmacies due to pharmacy/plan contracts, or as the patient moves through the different stages of their insurance plan.     Reyes Sharps, CPHT Pharmacy Technician Patient Advocate Specialist Lead [facility] Patient Advocate Team Direct Number: [phone]  Fax: [phone]

## 2024-04-21 ENCOUNTER — Encounter: Payer: Self-pay | Admitting: *Deleted

## 2024-04-21 ENCOUNTER — Encounter: Payer: Self-pay | Admitting: Hematology

## 2024-04-21 LAB — CULTURE, BLOOD (ROUTINE X 2)
Culture: NO GROWTH
Culture: NO GROWTH
Special Requests: ADEQUATE
Special Requests: ADEQUATE

## 2024-04-21 NOTE — Progress Notes (Unsigned)
 " Patient Care Team: [facility] as PCP - General [name], [name], MD as PCP - Cardiology (Cardiology) Franchot [name], NP as Nurse Practitioner (Nurse Practitioner) Tyree [name], RN as Oncology Nurse Navigator Curvin [name], MD as Consulting Physician (General Surgery) [name], MD as Consulting Physician (Hematology) [name], MD as Consulting Physician (Radiation Oncology)  Clinic Day:  04/23/2024  Referring physician: [name], MD  ASSESSMENT & PLAN:   Assessment & Plan: Malignant neoplasm of upper-outer quadrant of left breast in female, 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  -Due to her young age and node positive disease, Neoadjuvant chemotherapy AC-T was recommended. - She began neoadjuvant chemotherapy AC-T on 04/08/2024.   - 02/20/2025 -she presents for cycle 2 6-day 1 chemotherapy AC T.    Thrombus formation Patient hospitalized from 04/15/2024 through 04/18/2024.  Reviewed CT scan of soft tissue in the neck.  This showed a probable intraluminal thrombus extending 4.7 cm superior of the Port-A-Cath.  Surgery and IR were both consulted while patient was in the hospital.  It was recommended she be treated with IV heparin  and then bridged with Lovenox .  Clot formed while Eliquis .  So she was changed to Pradaxa  and discharged with that medication.  There was also concern about infection, possibly sepsis.  All blood cultures have come back negative.  She is currently on Augmentin  twice daily.  Port has  not been accessed since hospitalization due to concern of infection and/or thrombus formation.  Labs drawn peripherally today.  Discussed with Dr. [name].  She also reviewed patient's hospitalization record.  Due to negative blood culture results, recommended she discontinue antibiotics.  Recommended we assess for blood return from the port.  If blood return is easy, okay to use port for chemotherapy AC today.  Talked with RN and infusion suite, blood return from port was robust and was used for chemotherapy today, AC.  She will return in 2 days for G-CSF injection.  Left breast cancer, ER + Patient presenting for cycle 2 day 1 neoadjuvant chemotherapy with AC.  Shortly after cycle 1, she did develop generalized bone pain, not relieved by usual pain medications.  She was also having shortness of breath.  Was hospitalized for infection and thrombus formation intraluminal of right jugular vein, superior to the Port-A-Cath.  Prior to that, patient did tolerate chemotherapy well.  Did have some cold sensitivity and neuropathy in the bottoms of her feet.  This has resolved since hospitalization.  Labs were unremarkable today.  Spoke with Dr. [name] regarding hospitalization.  Advised to for blood return from Port-A-Cath.  Since patient is on blood thinner and there was no evidence of PE or other clots, may use Port-A-Cath if blood return is robust.  Patient was released to infusion suite.  Discussed with [name], RN, who was able to access patient's port and good blood return was present.  Patient proceeded with chemotherapy, AC, with no problems during treatment.  Plan Labs reviewed. -CBC and CMP unremarkable. Reviewed hospitalization records. -All blood cultures negative.  Per  Dr.[name], advised patient to discontinue antibiotics. Patient now on Pradaxa  as anticoagulant and tolerating well. She is feeling well today and ready to with treatment. Are appropriate for treatment today. Cycle 2 day 1  neoadjuvant chemotherapy AC.  Good blood return from Port-A-Cath be administered through Port-A-Cath. G-CSF injection as scheduled. /Flush, follow-up, and cycle 3 therapy as scheduled.    The patient understands the plans discussed today and is in agreement with them.  She knows to contact our office if she develops concerns prior to her next appointment.  I provided 30 minutes of face-to-face time during this encounter and > 50% was spent counseling as documented under my assessment and plan.    [name], NP  [facility] - A DEPT OF [name] [address] [place] [address] Dept: [phone] Dept Fax: [phone]   No orders of the defined types were placed in this encounter.     CHIEF COMPLAINT:  CC: Left breast cancer, ER +  Current Treatment: Neoadjuvant chemotherapy AC every 2 weeks  INTERVAL HISTORY:  Monica Holland is here today for repeat clinical assessment.  She last saw Dr. Lanny on 04/08/2024.  Today, she presents for cycle 2 day 1 neoadjuvant chemotherapy AC.  She will receive G-CSF injection on day 3 of every cycle.  Since her initial cycle of chemotherapy, she developed thrombus formation intraluminal of right jugular vein.  This is superior to Port-A-Cath.  IR and surgery both consulted during the hospital visit.  She was treated with heparin .  This was then bridged to Lovenox  and then bridged to Pradaxa  which patient is not taking twice daily.  She was also started on empiric antibiotics.  All blood cultures were negative.  Advised to discontinue antibiotic use.  Patient reports feeling much better since discharge from hospital.  She denies chest pain, chest pressure, or shortness of breath. She denies headaches or visual disturbances. She denies abdominal pain, nausea, vomiting, or changes in bowel or bladder habits.   She denies fevers or chills. She denies pain. Her appetite is good. Her weight  has been stable.  I have reviewed the past medical history, past surgical history, social history and family history with the patient and they are unchanged from previous note.  ALLERGIES:  is allergic to nsaids.  MEDICATIONS:  Current Outpatient Medications  Medication Sig Dispense Refill   acetaminophen  (TYLENOL ) 325 MG tablet Take 2 tablets (650 mg total) by mouth every 6 (six) hours as needed for mild pain (pain score 1-3), fever or headache.     albuterol  (VENTOLIN  HFA) 108 (90 Base) MCG/ACT inhaler Inhale 1-2 puffs into the lungs every 6 (six) hours as needed for wheezing or shortness of breath. 8 g 0   amoxicillin -clavulanate (AUGMENTIN ) 875-125 MG tablet Take 1 tablet by mouth 2 (two) times daily for 7 days. 14 tablet 0   dabigatran  (PRADAXA ) 150 MG CAPS capsule Take 1 capsule (150 mg total) by mouth 2 (two) times daily. 60 capsule 2   enoxaparin  (LOVENOX ) 100 MG/ML injection Inject 0.85 mLs (85 mg total) into the skin every 12 (twelve) hours. 2.55 mL 0   famotidine  (PEPCID ) 20 MG tablet Take 20 mg by mouth 2 (two) times daily.     fluticasone  (FLONASE ) 50 MCG/ACT nasal spray Place 2 sprays into both nostrils daily. 9.9 mL 2   gabapentin  (NEURONTIN ) 100 MG capsule Take 2 capsules (200 mg total) by mouth 2 (two) times  daily. 120 capsule 1   loratadine  (CLARITIN ) 10 MG tablet Take 10 mg by mouth every morning.     ondansetron  (ZOFRAN ) 8 MG tablet Take 1 tab (8 mg) by mouth every 8 hrs as needed for nausea/vomiting. Start third day after doxorubicin /cyclophosphamide  chemotherapy. 30 tablet 1   oxyCODONE  (ROXICODONE ) 5 MG immediate release tablet Take 1 tablet (5 mg total) by mouth every 6 (six) hours as needed. 30 tablet 0   pantoprazole  (PROTONIX ) 40 MG tablet Take 1 tablet (40 mg total) by mouth 2 (two) times daily. 60 tablet 1   polyethylene glycol (MIRALAX  / GLYCOLAX ) 17 g packet Take 17 g by mouth daily. 14 each 0   senna (SENOKOT) 8.6 MG TABS tablet Take 2 tablets (17.2 mg total) by  mouth at bedtime. 120 tablet 0   No current facility-administered medications for this visit.    HISTORY OF PRESENT ILLNESS:   Oncology History  Malignant neoplasm of upper-outer quadrant of left breast in female, estrogen receptor positive (HCC)  03/19/2024 Cancer Staging   Staging form: Breast, AJCC 8th Edition - Clinical stage from 03/19/2024: Stage IIA (cT2, cN1, cM0, G2, ER+, PR+, HER2-) - Signed by [name], MD on 03/26/2024 Stage prefix: Initial diagnosis Histologic grading system: 3 grade system   03/24/2024 Initial Diagnosis   Malignant neoplasm of upper-outer quadrant of left breast in female, estrogen receptor positive (HCC)   04/08/2024 -  Chemotherapy   Patient is on Treatment Plan : BREAST DOSE DENSE AC q14d / PACLitaxel q7d     04/08/2024 Genetic Testing   Negative genetic testing. AXIN2  p.S796G (c.2386A>G)  and SMAD4 p.I228V (c.682A>G)  VUS The report date is April 08, 2024.  The CancerNext-Expanded gene panel offered by [facility] and includes sequencing, rearrangement, and RNA analysis for the following 77 genes: AIP, ALK, APC, ATM, BAP1, BARD1, BMPR1A, BRCA1, BRCA2, BRIP1, CDC73, CDH1, CDK4, CDKN1B, CDKN2A, CEBPA, CHEK2, CTNNA1, DDX41, DICER1, ETV6, FH, FLCN, GATA2, LZTR1, MAX, MBD4, MEN1, MET, MLH1, MSH2, MSH3, MSH6, MUTYH, NF1, NF2, NTHL1, PALB2, PHOX2B, PMS2, POT1, PRKAR1A, PTCH1, PTEN, RAD51C, RAD51D, RB1, RET, RPS20, RUNX1, SDHA, SDHAF2, SDHB, SDHC, SDHD, SMAD4, SMARCA4, SMARCB1, SMARCE1, STK11, SUFU, TMEM127, TP53, TSC1, TSC2, VHL, and WT1 (sequencing and deletion/duplication); AXIN2, CTNNA1, DDX41, EGFR, HOXB13, KIT, MBD4, MITF, MSH3, PDGFRA, POLD1 and POLE (sequencing only); EPCAM and GREM1 (deletion/duplication only). RNA data is routinely analyzed for use in variant interpretation for all genes.        REVIEW OF SYSTEMS:   Constitutional: Denies fevers, chills or abnormal weight loss Eyes: Denies blurriness of vision Ears, nose, mouth, throat, and  face: Denies mucositis or sore throat Respiratory: Denies cough, dyspnea or wheezes Cardiovascular: Denies palpitation, chest discomfort or lower extremity swelling Gastrointestinal:  Denies nausea, heartburn or change in bowel habits Skin: Denies abnormal skin rashes Lymphatics: Denies new lymphadenopathy or easy bruising Neurological:Denies numbness, tingling or new weaknesses Behavioral/Psych: Mood is stable, no new changes  All other systems were reviewed with the patient and are negative.   VITALS:   Today's Vitals   04/22/24 1320 04/22/24 1321  BP: 116/80   Pulse: 87   Resp: 17   Temp: 98.4 F (36.9 C)   SpO2: 99%   Weight: 188 lb 6.4 oz (85.5 kg)   PainSc:  4    Body mass index is 26.28 kg/m.   Wt Readings from Last 3 Encounters:  04/22/24 188 lb 6.4 oz (85.5 kg)  04/08/24 187 lb 6.4 oz (85 kg)  03/31/24 177  lb 14.6 oz (80.7 kg)    Body mass index is 26.28 kg/m.  Performance status (ECOG): 2 - Symptomatic, <50% confined to bed  PHYSICAL EXAM:   GENERAL:alert, no distress and comfortable SKIN: skin color, texture, turgor are normal, no rashes or significant lesions EYES: normal, Conjunctiva are pink and non-injected, sclera clear OROPHARYNX:no exudate, no erythema and lips, buccal mucosa, and tongue normal  NECK: supple, thyroid normal size, non-tender, without nodularity LYMPH:  no palpable lymphadenopathy in the cervical, axillary or inguinal LUNGS: clear to auscultation and percussion with normal breathing effort HEART: regular rate & rhythm and no murmurs and no lower extremity edema ABDOMEN:abdomen soft, non-tender and normal bowel sounds Musculoskeletal:no cyanosis of digits and no clubbing  NEURO: alert & oriented x 3 with fluent speech, no focal motor/sensory deficits  LABORATORY DATA:  I have reviewed the data as listed    Component Value Date/Time   NA 138 04/22/2024 1256   K 3.7 04/22/2024 1256   CL 106 04/22/2024 1256   CO2 24 04/22/2024  1256   GLUCOSE 111 (H) 04/22/2024 1256   BUN 11 04/22/2024 1256   CREATININE 0.81 04/22/2024 1256   CALCIUM 9.4 04/22/2024 1256   PROT 7.0 04/22/2024 1256   ALBUMIN 4.5 04/22/2024 1256   AST 29 04/22/2024 1256   ALT 68 (H) 04/22/2024 1256   ALKPHOS 75 04/22/2024 1256   BILITOT <0.2 04/22/2024 1256   GFRNONAA >60 04/22/2024 1256   GFRAA 88 (L) 03/10/2013 0635    Lab Results  Component Value Date   WBC 7.6 04/22/2024   NEUTROABS 4.8 04/22/2024   HGB 12.4 04/22/2024   HCT 36.9 04/22/2024   MCV 82.9 04/22/2024   PLT 228 04/22/2024    RADIOGRAPHIC STUDIES: US  RT BREAST BX W LOC DEV 1ST LESION IMG BX SPEC US  GUIDE Addendum Date: 04/16/2024 ADDENDUM REPORT: 04/16/2024 10:31 ADDENDUM: PATHOLOGY revealed: Breast, right, needle core biopsy, 9 o'clock, 2 cmfn, venus- FIBROADENOMA Pathology results are CONCORDANT with imaging findings, per Dr. Aliene Mir. Pathology results and recommendations below were discussed with patient by telephone. Patient reported biopsy site within normal limits with slight tenderness at the site. Post biopsy care instructions were reviewed, questions were answered and my direct phone number was provided to patient. Patient was instructed to call [facility] if any concerns or questions arise related to the biopsy. RECOMMENDATION: 1. Patient to follow current treatment plan / surgical management for recently diagnosed breast cancer. 2. Ultrasound-guided biopsy of RIGHT axillary lymph node, with borderline cortical thickening measuring up to 0.4 cm, could not be performed due to its close proximity to a large artery. If clinically relevant, this lymph node can be tagged with radioactive seed for excisional biopsy. Pathology results reported by Mliss CHARM Molt RN 04/16/2024. Electronically Signed   By: Aliene Lloyd M.D.   On: 04/16/2024 10:31   Result Date: 04/16/2024 CLINICAL DATA:  39 year old woman with multifocal LEFT breast IDC with LEFT  axillary lymph node metastasis presents for biopsy of indeterminate 0.9 cm RIGHT breast mass. EXAM: ULTRASOUND GUIDED RIGHT BREAST CORE NEEDLE BIOPSY COMPARISON:  Previous exam(s). PROCEDURE: I met with the patient and we discussed the procedure of ultrasound-guided biopsy, including benefits and alternatives. We discussed the high likelihood of a successful procedure. We discussed the risks of the procedure, including infection, bleeding, tissue injury, clip migration, and inadequate sampling. Informed written consent was given. The usual time-out protocol was performed immediately prior to the procedure. Lesion quadrant: Upper outer quadrant  Using sterile technique and 1% Lidocaine  as local anesthetic, under direct ultrasound visualization, a 14 gauge spring-loaded device was used to perform biopsy of 0.9 cm RIGHT breast mass using a superior approach. At the conclusion of the procedure venus shaped tissue marker clip was deployed into the biopsy cavity. Follow up 2 view mammogram was performed and dictated separately. IMPRESSION: 1. Ultrasound guided biopsy of 0.9 cm RIGHT breast mass (9 o'clock 2 CMFN). No apparent complications. 2. Ultrasound-guided biopsy of RIGHT axillary lymph node with borderline cortical thickening measuring up to 0.4 cm could not be performed due to its close proximity to a large artery. Electronically Signed: By: Aliene Lloyd M.D. On: 04/14/2024 12:31   CT Angio Chest PE W and/or Wo Contrast Result Date: 04/15/2024 EXAM: CTA CHEST 04/15/2024 03:56:03 AM TECHNIQUE: CTA of the chest was performed after the administration of intravenous contrast. Multiplanar reformatted images are provided for review. MIP images are provided for review. Automated exposure control, iterative reconstruction, and/or weight based adjustment of the mA/kV was utilized to reduce the radiation dose to as low as reasonably achievable. COMPARISON: CT of the chest dated 04/04/2024. CLINICAL HISTORY: Pulmonary  embolism (PE) suspected, high prob. FINDINGS: PULMONARY ARTERIES: Pulmonary arteries are adequately opacified for evaluation. No acute pulmonary embolus. Main pulmonary artery is normal in caliber. MEDIASTINUM: The heart and pericardium demonstrate no acute abnormality. There is no acute abnormality of the thoracic aorta. LYMPH NODES: No mediastinal, hilar or axillary lymphadenopathy. LUNGS AND PLEURA: There are ground-glass and reticular opacities present dependently within the lower lobes bilaterally, likely representing combination of edema and atelectasis. No evidence of pleural effusion or pneumothorax. UPPER ABDOMEN: Limited images of the upper abdomen are unremarkable. SOFT TISSUES AND BONES: A right internal jugular chest port is present. There is a 13 mm round cystic lesion within the inferior pole of the left lobe of the thyroid. No acute bone abnormality. IMPRESSION: 1. No pulmonary embolism. 2. Ground-glass and reticular opacities in the lower lobes bilaterally, likely representing edema and atelectasis. Electronically signed by: Evalene Coho MD 04/15/2024 04:10 AM EST RP Workstation: [id number]   CT Soft Tissue Neck W Contrast Result Date: 04/15/2024 EXAM: CT NECK WITH CONTRAST 04/15/2024 03:56:03 AM TECHNIQUE: CT of the neck was performed with the administration of 100 mL of iohexol  (OMNIPAQUE ) 350 MG/ML injection. Multiplanar reformatted images are provided for review. Automated exposure control, iterative reconstruction, and/or weight based adjustment of the mA/kV was utilized to reduce the radiation dose to as low as reasonably achievable. COMPARISON: None available. CLINICAL HISTORY: Neck infection. FINDINGS: AERODIGESTIVE TRACT: No discrete mass. No edema. SALIVARY GLANDS: The parotid and submandibular glands are unremarkable. THYROID: There is a 13 mm round cystic lesion present within the inferior pole of the left lobe of the thyroid. LYMPH NODES: No suspicious cervical lymphadenopathy.  SOFT TISSUES: There is a right-sided internal jugular chest port present. There is intraluminal thrombus present within the internal jugular vein above the level of the catheter extending approximately 4.7 cm. No mass or fluid collection. BONES: No abnormality. OTHER: Visualized sinuses and mastoid air cells are well aerated. Visualized lungs are clear. IMPRESSION: 1. Right-sided internal jugular chest port with intraluminal thrombus above the level of the catheter extending approximately 4.7 cm. Electronically signed by: Evalene Coho MD 04/15/2024 04:05 AM EST RP Workstation: [id number]   US  LIMITED ULTRASOUND INCLUDING AXILLA RIGHT BREAST Result Date: 04/14/2024 CLINICAL DATA:  39 year old woman with biopsy-proven multifocal LEFT breast IDC with LEFT axillary lymph node metastasis presents for second-look ultrasound  for evaluation of 0.9 cm RIGHT breast mass and prominent RIGHT axillary lymph nodes identified on MRI. EXAM: ULTRASOUND OF THE RIGHT BREAST COMPARISON:  MRI breast 04/04/2024. Diagnostic mammogram 03/19/2024. FINDINGS: Targeted sonographic evaluation of the RIGHT breast and axilla was performed. 0.9 x 0.4 x 0.8 cm oval circumscribed hypoechoic mass seen at 9 o'clock 2 CMFN corresponds to the mass seen on MRI. RIGHT axillary lymph node with borderline cortical thickening measuring 0.4 cm. IMPRESSION: 1. Indeterminate 0.9 cm RIGHT breast mass (9 o'clock 2 CMFN), corresponding to the mass seen on MRI. 2. Borderline thickening of RIGHT axillary lymph node measuring up to 0.4 cm. RECOMMENDATION: 1. Ultrasound-guided core needle biopsy 0.9 cm RIGHT breast mass (9 o'clock 2 CMFN). 2. Ultrasound-guided core needle biopsy of RIGHT axillary lymph node with cortical thickness measuring 0.4 cm. I have discussed the findings and recommendations with the patient. The biopsy procedure was explained to the patient and questions were answered. Patient expressed their understanding of the biopsy recommendation.  Patient will be scheduled for biopsy at her earliest convenience by the schedulers. Ordering provider will be notified. If applicable, a reminder letter will be sent to the patient regarding the next appointment. BI-RADS CATEGORY  4: Suspicious. Electronically Signed   By: Aliene Lloyd M.D.   On: 04/14/2024 12:29   MM CLIP PLACEMENT RIGHT Result Date: 04/14/2024 CLINICAL DATA:  Status post ultrasound-guided biopsy of RIGHT breast mass EXAM: 3D DIAGNOSTIC RIGHT MAMMOGRAM POST ULTRASOUND BIOPSY COMPARISON:  Previous exam(s). ACR Breast Density Category c: The breasts are heterogeneously dense, which may obscure small masses. FINDINGS: 3D Mammographic images were obtained following ultrasound guided biopsy of RIGHT breast mass. The biopsy marking clip is in expected position at the site of biopsy. IMPRESSION: Appropriate positioning of the Venus shaped biopsy marking clip at the site of biopsy in the anterior depth of the outer RIGHT breast. Final Assessment: Post Procedure Mammograms for Marker Placement Electronically Signed   By: Aliene Lloyd M.D.   On: 04/14/2024 12:25   NM Bone Scan Whole Body Result Date: 04/08/2024 CLINICAL DATA:  Invasive breast cancer.  Staging. EXAM: NUCLEAR MEDICINE WHOLE BODY BONE SCAN TECHNIQUE: Whole body anterior and posterior images were obtained approximately 3 hours after intravenous injection of radiopharmaceutical. RADIOPHARMACEUTICALS:  21.1 mCi Technetium-34m MDP IV COMPARISON:  Chest abdomen pelvis CT 04/04/2024. FINDINGS: No focal radiotracer accumulation within the skeletal anatomy to raise concern for bony metastatic involvement. IMPRESSION: No findings to suggest active skeletal metastases. Electronically Signed   By: Camellia Candle M.D.   On: 04/08/2024 09:10   CT ABDOMEN PELVIS W CONTRAST Result Date: 04/08/2024 CLINICAL DATA:  Breast cancer, invasive, stage I/II/III, initial workup new breast cancer with metastatic lymph nodes prior to starting chemo. * Tracking  Code: BO * EXAM: CT CHEST, ABDOMEN, AND PELVIS WITH CONTRAST TECHNIQUE: Multidetector CT imaging of the chest, abdomen and pelvis was performed following the standard protocol during bolus administration of intravenous contrast. RADIATION DOSE REDUCTION: This exam was performed according to the departmental dose-optimization program which includes automated exposure control, adjustment of the mA and/or kV according to patient size and/or use of iterative reconstruction technique. CONTRAST:  OMNIPAQUE  IOHEXOL  300 MG/ML  SOLN COMPARISON:  CT scan chest, abdomen and pelvis from 03/17/2024. FINDINGS: CT CHEST FINDINGS Cardiovascular: Normal cardiac size. No pericardial effusion. No aortic aneurysm. Mediastinum/Nodes: Redemonstration of a 1.0 x 1.2 cm hypoattenuating nodule in the inferior left thyroid lobe, incompletely characterized on the current exam but unchanged since the prior study. The  nodule does not meet the size criteria for follow-up ultrasound evaluation. No solid / cystic mediastinal masses. The esophagus is nondistended precluding optimal assessment. No mediastinal or hilar lymphadenopathy by size criteria. Redemonstration of several enlarged heterogeneous rounded axillary lymph nodes with largest measuring up to 1.2 x 1.8 cm, which previously measured 1.2 x 1.6 cm. Overall no significant interval change over 3 weeks' duration. No right axillary lymphadenopathy by size criteria. Lungs/Pleura: The central tracheo-bronchial tree is patent. No mass or consolidation. No pleural effusion or pneumothorax. No suspicious lung nodules. Musculoskeletal: A CT Port-a-Cath is seen in the right upper chest wall with the catheter terminating in the cavo-atrial junction region. Visualized soft tissues of the chest wall are otherwise grossly unremarkable. No suspicious osseous lesions. CT ABDOMEN PELVIS FINDINGS Hepatobiliary: The liver is normal in size. Non-cirrhotic configuration. No suspicious mass. No  intrahepatic or extrahepatic bile duct dilation. No calcified gallstones. Normal gallbladder wall thickness. No pericholecystic inflammatory changes. Pancreas: Unremarkable. No pancreatic ductal dilatation or surrounding inflammatory changes. Spleen: Within normal limits. No focal lesion. Adrenals/Urinary Tract: Adrenal glands are unremarkable. No suspicious renal mass. No hydronephrosis. There is a punctate nonobstructing calculus in the right kidney upper pole. No other renal or ureteric calculi. Unremarkable urinary bladder. Stomach/Bowel: No disproportionate dilation of the small or large bowel loops. No evidence of abnormal bowel wall thickening or inflammatory changes. The appendix is unremarkable. Vascular/Lymphatic: No ascites or pneumoperitoneum. No abdominal or pelvic lymphadenopathy, by size criteria. No aneurysmal dilation of the major abdominal arteries. Reproductive: Not well evaluated on the CT scan exam. However, having said that note is made of bulky retroverted uterus with intrauterine device. The cervix appears bulky however, not well evaluated on this exam. Correlate clinically to determine the need for additional imaging. Bilateral adnexa appears within normal limits. Other: There is a tiny fat containing umbilical hernia. Multiple venous collaterals noted in the subcutaneous tissue over the bilateral inguinal regions/lower anterior pelvis, nonspecific but typically seen as a sequela of chronic venous thrombosis of the upstream vein which are unfortunately not well evaluated on this phase of contrast. The soft tissues and abdominal wall are otherwise unremarkable. Musculoskeletal: No suspicious osseous lesions. IMPRESSION: 1. Redemonstration of multiple enlarged left axillary lymph nodes, concerning for metastases. No right axillary lymphadenopathy by size criteria. No mediastinal or hilar lymphadenopathy. No abdominal or pelvic lymphadenopathy by size criteria. 2. No other metastatic disease  identified within the chest, abdomen or pelvis. 3. Multiple other nonacute observations, as described above. Electronically Signed   By: Ree Molt M.D.   On: 04/08/2024 08:35   CT Chest W Contrast Result Date: 04/08/2024 CLINICAL DATA:  Breast cancer, invasive, stage I/II/III, initial workup new breast cancer with metastatic lymph nodes prior to starting chemo. * Tracking Code: BO * EXAM: CT CHEST, ABDOMEN, AND PELVIS WITH CONTRAST TECHNIQUE: Multidetector CT imaging of the chest, abdomen and pelvis was performed following the standard protocol during bolus administration of intravenous contrast. RADIATION DOSE REDUCTION: This exam was performed according to the departmental dose-optimization program which includes automated exposure control, adjustment of the mA and/or kV according to patient size and/or use of iterative reconstruction technique. CONTRAST:  OMNIPAQUE  IOHEXOL  300 MG/ML  SOLN COMPARISON:  CT scan chest, abdomen and pelvis from 03/17/2024. FINDINGS: CT CHEST FINDINGS Cardiovascular: Normal cardiac size. No pericardial effusion. No aortic aneurysm. Mediastinum/Nodes: Redemonstration of a 1.0 x 1.2 cm hypoattenuating nodule in the inferior left thyroid lobe, incompletely characterized on the current exam but unchanged since the prior  study. The nodule does not meet the size criteria for follow-up ultrasound evaluation. No solid / cystic mediastinal masses. The esophagus is nondistended precluding optimal assessment. No mediastinal or hilar lymphadenopathy by size criteria. Redemonstration of several enlarged heterogeneous rounded axillary lymph nodes with largest measuring up to 1.2 x 1.8 cm, which previously measured 1.2 x 1.6 cm. Overall no significant interval change over 3 weeks' duration. No right axillary lymphadenopathy by size criteria. Lungs/Pleura: The central tracheo-bronchial tree is patent. No mass or consolidation. No pleural effusion or pneumothorax. No suspicious lung  nodules. Musculoskeletal: A CT Port-a-Cath is seen in the right upper chest wall with the catheter terminating in the cavo-atrial junction region. Visualized soft tissues of the chest wall are otherwise grossly unremarkable. No suspicious osseous lesions. CT ABDOMEN PELVIS FINDINGS Hepatobiliary: The liver is normal in size. Non-cirrhotic configuration. No suspicious mass. No intrahepatic or extrahepatic bile duct dilation. No calcified gallstones. Normal gallbladder wall thickness. No pericholecystic inflammatory changes. Pancreas: Unremarkable. No pancreatic ductal dilatation or surrounding inflammatory changes. Spleen: Within normal limits. No focal lesion. Adrenals/Urinary Tract: Adrenal glands are unremarkable. No suspicious renal mass. No hydronephrosis. There is a punctate nonobstructing calculus in the right kidney upper pole. No other renal or ureteric calculi. Unremarkable urinary bladder. Stomach/Bowel: No disproportionate dilation of the small or large bowel loops. No evidence of abnormal bowel wall thickening or inflammatory changes. The appendix is unremarkable. Vascular/Lymphatic: No ascites or pneumoperitoneum. No abdominal or pelvic lymphadenopathy, by size criteria. No aneurysmal dilation of the major abdominal arteries. Reproductive: Not well evaluated on the CT scan exam. However, having said that note is made of bulky retroverted uterus with intrauterine device. The cervix appears bulky however, not well evaluated on this exam. Correlate clinically to determine the need for additional imaging. Bilateral adnexa appears within normal limits. Other: There is a tiny fat containing umbilical hernia. Multiple venous collaterals noted in the subcutaneous tissue over the bilateral inguinal regions/lower anterior pelvis, nonspecific but typically seen as a sequela of chronic venous thrombosis of the upstream vein which are unfortunately not well evaluated on this phase of contrast. The soft tissues and  abdominal wall are otherwise unremarkable. Musculoskeletal: No suspicious osseous lesions. IMPRESSION: 1. Redemonstration of multiple enlarged left axillary lymph nodes, concerning for metastases. No right axillary lymphadenopathy by size criteria. No mediastinal or hilar lymphadenopathy. No abdominal or pelvic lymphadenopathy by size criteria. 2. No other metastatic disease identified within the chest, abdomen or pelvis. 3. Multiple other nonacute observations, as described above. Electronically Signed   By: Ree Molt M.D.   On: 04/08/2024 08:35   MR BREAST BILATERAL W WO CONTRAST INC CAD Result Date: 04/04/2024 CLINICAL DATA:  Recent diagnosis of multifocal left breast malignancy and a metastatic left axillary lymph node. Assess extent of disease. EXAM: BILATERAL BREAST MRI WITH AND WITHOUT CONTRAST TECHNIQUE: Multiplanar, multisequence MR images of both breasts were obtained prior to and following the intravenous administration of 8 ml of Gadavist  Three-dimensional MR images were rendered by post-processing of the original MR data on an independent workstation. The three-dimensional MR images were interpreted, and findings are reported in the following complete MRI report for this study. Three dimensional images were evaluated at the independent interpreting workstation using the DynaCAD thin client. COMPARISON:  Diagnostic mammography and follow-up biopsy exams from 03/19/2024. FINDINGS: Breast composition: c. Heterogeneous fibroglandular tissue. Background parenchymal enhancement: Mild Right breast: There is an oval, circumscribed enhancing mass in the right breast measuring 9 mm in long axis, a portion of  which demonstrates washout kinetics. This lies in the 7 o'clock retroareolar location, best appreciated on image 58, series 10. There are no other right breast masses or areas of abnormal enhancement. Left breast: Large irregular mass lies in the upper outer breast measuring approximately 4.4 x 2.2 x  3 cm, associated with the ribbon shaped post biopsy marker clip, which is best appreciated on image 70, series 3. Inferior to this mass is a second smaller mass measuring 1.3 cm, associated with artifact from the coil shaped post biopsy marker clip, best appreciated on image 51, series 10. There is a small focus of enhancement associated with the heart shaped post biopsy marker clip, image 67, series 10, reflecting the biopsy proven fibroadenoma. There is low level enhancement that extends from the dominant mass to the skin surface of the anterior upper outer breast, best appreciated on image 73, series 10. This may reflect post biopsy change, which is suspected, with malignancy not excluded. There is significant skin thickening with associated increased T2 signal, but only minimal diffuse enhancement. This is consistent with reactive edema. There are no other left breast masses or areas of abnormal enhancement. Lymph nodes: No abnormal appearing left axillary lymph nodes. The biopsy proven metastatic left axillary lymph node is not included in the field of view. There are several adjacent prominent level 2 right axillary lymph nodes with cortical thickness is up to 4 mm. Ancillary findings:  None. IMPRESSION: 1. Biopsy-proven multifocal left breast malignancy. The dominant mass, associated with the ribbon shaped post biopsy marker clip, measures 4.4 x 2.2 x 3 cm. The second biopsy-proven carcinoma lies inferior to the dominant mass, associated with the coil shaped post biopsy marker clip, measuring 1.3 cm. 2. Low level abnormal enhancement extends from the dominant mass to the anterior, upper outer quadrant skin surface. This may reflect post biopsy reactive enhancement, but additional malignancy is not excluded. 3. More diffuse left breast skin thickening with associated increased T2 signal, but only minimal enhancement, is suspected to be reactive edema. 4. No other evidence of left breast malignancy. 5.  Indeterminate enhancing 9 mm mass in the right breast, anterior lower outer quadrant, image 58, series 10. Tissue sampling is recommended. 6. Prominent right axillary lymph nodes. RECOMMENDATION: 1. Targeted ultrasound of the right breast to attempt to localize the 9 mm mass in the anterior, lower outer quadrant. If this mass can be visualized sonographically, than ultrasound-guided core needle biopsy would be indicated. If this mass cannot be visualized sonographically, then MRI guided core needle biopsy would be indicated. 2. Recommend right axilla ultrasound as well to assess the prominent lymph node seen on MRI. BI-RADS CATEGORY  4: Suspicious. Electronically Signed   By: Alm Parkins M.D.   On: 04/04/2024 09:46   ECHOCARDIOGRAM COMPLETE Result Date: 04/03/2024    ECHOCARDIOGRAM REPORT   Patient Name:   Monica Holland Date of Exam: 04/03/2024 Medical Rec #:  [id number]        Height:       71.0 in Accession #:    [id number]       Weight:       177.9 lb Date of Birth:  Jun 08, 1985       BSA:          2.006 m Patient Age:    38 years         BP:           146/90 mmHg Patient Gender: F  HR:           63 bpm. Exam Location:  Outpatient Procedure: 2D Echo, 3D Echo, Cardiac Doppler, Color Doppler and Strain Analysis            (Both Spectral and Color Flow Doppler were utilized during            procedure). Indications:    Z51.11 Encounter for antineoplastic chemotheraphy  History:        Patient has prior history of Echocardiogram examinations, most                 recent 08/31/2021. Risk Factors:Current Smoker. Breast cancer.  Sonographer:    Ellouise Mose RDCS Referring Phys: [id number] ONITA MATTOCK IMPRESSIONS  1. Left ventricular ejection fraction, by estimation, is 60 to 65%. Left ventricular ejection fraction by 3D volume is 56 %. The left ventricle has normal function. The left ventricle has no regional wall motion abnormalities. Left ventricular diastolic  parameters were normal. The average left  ventricular global longitudinal strain is -19.0 %.  2. Right ventricular systolic function is normal. The right ventricular size is normal. There is normal pulmonary artery systolic pressure. The estimated right ventricular systolic pressure is 21.7 mmHg.  3. The mitral valve is normal in structure. Trivial mitral valve regurgitation. No evidence of mitral stenosis.  4. Tricuspid valve regurgitation is mild to moderate.  5. The aortic valve is tricuspid. Aortic valve regurgitation is not visualized. No aortic stenosis is present.  6. The inferior vena cava is dilated in size with >50% respiratory variability, suggesting right atrial pressure of 8 mmHg. FINDINGS  Left Ventricle: Left ventricular ejection fraction, by estimation, is 60 to 65%. Left ventricular ejection fraction by 3D volume is 56 %. The left ventricle has normal function. The left ventricle has no regional wall motion abnormalities. The average left ventricular global longitudinal strain is -19.0 %. The left ventricular internal cavity size was normal in size. There is no left ventricular hypertrophy. Left ventricular diastolic parameters were normal. Right Ventricle: The right ventricular size is normal. No increase in right ventricular wall thickness. Right ventricular systolic function is normal. There is normal pulmonary artery systolic pressure. The tricuspid regurgitant velocity is 1.85 m/s, and  with an assumed right atrial pressure of 8 mmHg, the estimated right ventricular systolic pressure is 21.7 mmHg. Left Atrium: Left atrial size was normal in size. Right Atrium: Right atrial size was normal in size. Pericardium: There is no evidence of pericardial effusion. Mitral Valve: The mitral valve is normal in structure. Trivial mitral valve regurgitation. No evidence of mitral valve stenosis. Tricuspid Valve: The tricuspid valve is normal in structure. Tricuspid valve regurgitation is mild to moderate. No evidence of tricuspid stenosis. Aortic  Valve: The aortic valve is tricuspid. Aortic valve regurgitation is not visualized. No aortic stenosis is present. Pulmonic Valve: The pulmonic valve was normal in structure. Pulmonic valve regurgitation is trivial. No evidence of pulmonic stenosis. Aorta: The aortic root is normal in size and structure. Venous: The inferior vena cava is dilated in size with greater than 50% respiratory variability, suggesting right atrial pressure of 8 mmHg. IAS/Shunts: There is redundancy of the interatrial septum. No atrial level shunt detected by color flow Doppler. Additional Comments: 3D was performed not requiring image post processing on an independent workstation and was normal.  LEFT VENTRICLE PLAX 2D LVIDd:         4.90 cm         Diastology LVIDs:  3.15 cm         LV e' medial:    8.70 cm/s LV PW:         1.00 cm         LV E/e' medial:  7.7 LV IVS:        0.90 cm         LV e' lateral:   15.40 cm/s LVOT diam:     2.30 cm         LV E/e' lateral: 4.4 LV SV:         95 LV SV Index:   47              2D Longitudinal LVOT Area:     4.15 cm        Strain                                2D Strain GLS   -19.0 %                                Avg: LV Volumes (MOD) LV vol d, MOD    112.5 ml      3D Volume EF A2C:                           LV 3D EF:    Left LV vol d, MOD    97.3 ml                    ventricul A4C:                                        ar LV vol s, MOD    45.8 ml                    ejection A2C:                                        fraction LV vol s, MOD    45.0 ml                    by 3D A4C:                                        volume is LV SV MOD A2C:   66.7 ml                    56 %. LV SV MOD A4C:   97.3 ml LV SV MOD BP:    58.7 ml                                3D Volume EF:                                3D EF:        56 %  LV EDV:       168 ml                                LV ESV:       73 ml                                LV SV:        95 ml RIGHT VENTRICLE              IVC RV S prime:     13.80 cm/s  IVC diam: 2.60 cm TAPSE (M-mode): 1.5 cm                             PULMONARY VEINS                             Diastolic Velocity: 42.90 cm/s                             S/D Velocity:       1.50                             Systolic Velocity:  65.00 cm/s LEFT ATRIUM             Index        RIGHT ATRIUM           Index LA diam:        3.25 cm 1.62 cm/m   RA Area:     12.40 cm LA Vol (A2C):   43.8 ml 21.83 ml/m  RA Volume:   31.30 ml  15.60 ml/m LA Vol (A4C):   38.7 ml 19.29 ml/m LA Biplane Vol: 41.3 ml 20.59 ml/m  AORTIC VALVE LVOT Vmax:   113.00 cm/s LVOT Vmean:  73.200 cm/s LVOT VTI:    0.228 m  AORTA Ao Root diam: 3.10 cm Ao Asc diam:  3.30 cm MITRAL VALVE               TRICUSPID VALVE MV Area (PHT): 2.76 cm    TR Peak grad:   13.7 mmHg MV Decel Time: 275 msec    TR Vmax:        185.00 cm/s MV E velocity: 67.40 cm/s MV A velocity: 45.60 cm/s  SHUNTS MV E/A ratio:  1.48        Systemic VTI:  0.23 m                            Systemic Diam: 2.30 cm [name] [name] MD Electronically signed by [name] Balding MD Signature Date/Time: 04/03/2024/1:44:38 PM    Final    DG Chest 2 View Result Date: 04/03/2024 CLINICAL DATA:  Mid chest pain after port placed 3 days ago. EXAM: CHEST - 2 VIEW COMPARISON:  03/31/2024 FINDINGS: Right IJ Port-A-Cath has tip at the level of the cavoatrial junction. Lungs are adequately inflated without acute airspace process or effusion. Cardiomediastinal silhouette and remainder the exam is unchanged. IMPRESSION: 1. No acute cardiopulmonary disease. 2. Right IJ Port-A-Cath with tip at the level of the cavoatrial junction. Electronically Signed   By:  Toribio Agreste M.D.   On: 04/03/2024 12:00   DG CHEST PORT 1 VIEW Result Date: 03/31/2024 EXAM: 1 VIEW(S) XRAY OF THE CHEST 03/31/2024 09:10:00 AM COMPARISON: 03/17/2024 CLINICAL HISTORY: 39 year old female with left breast cancer. Port-A-Cath placed. FINDINGS: LINES, TUBES AND DEVICES: Right IJ  Port-A-Cath placement. Port-A-Cath tip at the cavoatrial junction level. LUNGS AND PLEURA: No focal pulmonary opacity. No pleural effusion. No pneumothorax. HEART AND MEDIASTINUM: No acute abnormality of the cardiac and mediastinal silhouettes. BONES AND SOFT TISSUES: No acute osseous abnormality. IMPRESSION: 1. Right internal jugular Port-A-Cath with tip at the cavoatrial junction level. 2. No acute cardiopulmonary abnormality. Electronically signed by: Helayne Hurst MD 03/31/2024 09:29 AM EST RP Workstation: [id number]   DG C-Arm 1-60 Min-No Report Result Date: 03/31/2024 Fluoroscopy was utilized by the requesting physician.  No radiographic interpretation.   "

## 2024-04-21 NOTE — Assessment & Plan Note (Signed)
 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  -Due to her young age and node positive disease, Neoadjuvant chemotherapy AC-T was recommended. - She began neoadjuvant chemotherapy AC-T on 04/08/2024.   - 02/20/2025 -she presents for cycle 2 6-day 1 chemotherapy AC T.

## 2024-04-22 ENCOUNTER — Inpatient Hospital Stay: Payer: Self-pay

## 2024-04-22 ENCOUNTER — Inpatient Hospital Stay (HOSPITAL_BASED_OUTPATIENT_CLINIC_OR_DEPARTMENT_OTHER): Payer: Self-pay | Admitting: Nurse Practitioner

## 2024-04-22 ENCOUNTER — Inpatient Hospital Stay: Payer: Self-pay | Attending: Family

## 2024-04-22 ENCOUNTER — Inpatient Hospital Stay: Attending: Family

## 2024-04-22 VITALS — BP 116/80 | HR 87 | Temp 98.4°F | Resp 17 | Wt 188.4 lb

## 2024-04-22 DIAGNOSIS — Z1732 Human epidermal growth factor receptor 2 negative status: Secondary | ICD-10-CM | POA: Diagnosis not present

## 2024-04-22 DIAGNOSIS — K209 Esophagitis, unspecified without bleeding: Secondary | ICD-10-CM | POA: Insufficient documentation

## 2024-04-22 DIAGNOSIS — Z1721 Progesterone receptor positive status: Secondary | ICD-10-CM | POA: Insufficient documentation

## 2024-04-22 DIAGNOSIS — Z7901 Long term (current) use of anticoagulants: Secondary | ICD-10-CM | POA: Diagnosis not present

## 2024-04-22 DIAGNOSIS — Z170 Estrogen receptor positive status [ER+]: Secondary | ICD-10-CM

## 2024-04-22 DIAGNOSIS — C50412 Malignant neoplasm of upper-outer quadrant of left female breast: Secondary | ICD-10-CM | POA: Diagnosis not present

## 2024-04-22 DIAGNOSIS — T451X5A Adverse effect of antineoplastic and immunosuppressive drugs, initial encounter: Secondary | ICD-10-CM | POA: Diagnosis not present

## 2024-04-22 DIAGNOSIS — C773 Secondary and unspecified malignant neoplasm of axilla and upper limb lymph nodes: Secondary | ICD-10-CM | POA: Insufficient documentation

## 2024-04-22 DIAGNOSIS — Z86711 Personal history of pulmonary embolism: Secondary | ICD-10-CM | POA: Diagnosis not present

## 2024-04-22 DIAGNOSIS — Z79899 Other long term (current) drug therapy: Secondary | ICD-10-CM | POA: Insufficient documentation

## 2024-04-22 DIAGNOSIS — Z5111 Encounter for antineoplastic chemotherapy: Secondary | ICD-10-CM | POA: Insufficient documentation

## 2024-04-22 LAB — CBC WITH DIFFERENTIAL (CANCER CENTER ONLY)
Abs Immature Granulocytes: 0.43 K/uL — ABNORMAL HIGH (ref 0.00–0.07)
Basophils Absolute: 0.1 K/uL (ref 0.0–0.1)
Basophils Relative: 1 %
Eosinophils Absolute: 0 K/uL (ref 0.0–0.5)
Eosinophils Relative: 1 %
HCT: 36.9 % (ref 36.0–46.0)
Hemoglobin: 12.4 g/dL (ref 12.0–15.0)
Immature Granulocytes: 6 %
Lymphocytes Relative: 22 %
Lymphs Abs: 1.7 K/uL (ref 0.7–4.0)
MCH: 27.9 pg (ref 26.0–34.0)
MCHC: 33.6 g/dL (ref 30.0–36.0)
MCV: 82.9 fL (ref 80.0–100.0)
Monocytes Absolute: 0.7 K/uL (ref 0.1–1.0)
Monocytes Relative: 9 %
Neutro Abs: 4.8 K/uL (ref 1.7–7.7)
Neutrophils Relative %: 61 %
Platelet Count: 228 K/uL (ref 150–400)
RBC: 4.45 MIL/uL (ref 3.87–5.11)
RDW: 15.2 % (ref 11.5–15.5)
WBC Count: 7.6 K/uL (ref 4.0–10.5)
nRBC: 0 % (ref 0.0–0.2)

## 2024-04-22 LAB — CMP (CANCER CENTER ONLY)
ALT: 68 U/L — ABNORMAL HIGH (ref 0–44)
AST: 29 U/L (ref 15–41)
Albumin: 4.5 g/dL (ref 3.5–5.0)
Alkaline Phosphatase: 75 U/L (ref 38–126)
Anion gap: 8 (ref 5–15)
BUN: 11 mg/dL (ref 6–20)
CO2: 24 mmol/L (ref 22–32)
Calcium: 9.4 mg/dL (ref 8.9–10.3)
Chloride: 106 mmol/L (ref 98–111)
Creatinine: 0.81 mg/dL (ref 0.44–1.00)
GFR, Estimated: >60 mL/min
Glucose, Bld: 111 mg/dL — ABNORMAL HIGH (ref 70–99)
Potassium: 3.7 mmol/L (ref 3.5–5.1)
Sodium: 138 mmol/L (ref 135–145)
Total Bilirubin: <0.2 mg/dL (ref 0.0–1.2)
Total Protein: 7 g/dL (ref 6.5–8.1)

## 2024-04-22 MED ORDER — SODIUM CHLORIDE 0.9 % IV SOLN
150.0000 mg | Freq: Once | INTRAVENOUS | Status: AC
  Administered 2024-04-22: 150 mg via INTRAVENOUS
  Filled 2024-04-22: qty 150

## 2024-04-22 MED ORDER — DEXAMETHASONE SOD PHOSPHATE PF 10 MG/ML IJ SOLN
10.0000 mg | Freq: Once | INTRAMUSCULAR | Status: AC
  Administered 2024-04-22: 10 mg via INTRAVENOUS
  Filled 2024-04-22: qty 1

## 2024-04-22 MED ORDER — SODIUM CHLORIDE 0.9 % IV SOLN
600.0000 mg/m2 | Freq: Once | INTRAVENOUS | Status: AC
  Administered 2024-04-22: 1240 mg via INTRAVENOUS
  Filled 2024-04-22: qty 62

## 2024-04-22 MED ORDER — SODIUM CHLORIDE 0.9% FLUSH: 10.0000 mL | INTRAVENOUS | Status: DC | PRN

## 2024-04-22 MED ORDER — PALONOSETRON HCL INJECTION 0.25 MG/5ML
0.2500 mg | Freq: Once | INTRAVENOUS | Status: AC
  Administered 2024-04-22: 0.25 mg via INTRAVENOUS
  Filled 2024-04-22: qty 5

## 2024-04-22 MED ORDER — SODIUM CHLORIDE 0.9 % IV SOLN: INTRAVENOUS | Status: DC

## 2024-04-22 MED ORDER — DOXORUBICIN HCL CHEMO IV INJECTION 2 MG/ML
60.0000 mg/m2 | Freq: Once | INTRAVENOUS | Status: AC
  Administered 2024-04-22: 124 mg via INTRAVENOUS
  Filled 2024-04-22: qty 62

## 2024-04-22 NOTE — Telephone Encounter (Signed)
 [id number]: Complementary Options for Symptom Management In Cancer (COSMIC) Assessing Benefits and Harms of Cannabis and Cannabinoid Use Among a Cohort of Cancer Patients Treated in [facility]    Followed up with patient via phone. Patient confirms she has received study survey via email. Patient says she plans to fill out the survey. Patient has contact information if she has any questions.  Laury Quale, MPH  Clinical Research Coordinator

## 2024-04-22 NOTE — Progress Notes (Signed)
 Patients PAC was not accessed today due to patient stating she had a blood clot in her PAC. Patient was sent to get peripheral labs drawn. Charge nurse Rocky and infusion nurse [employer].

## 2024-04-22 NOTE — Patient Instructions (Signed)
 [facility] - A DEPT OF [facility]  Discharge Instructions: Thank you for choosing [facility] to provide your oncology and hematology care.   If you have a lab appointment with the Cancer Center, please go directly to the Cancer Center and check in at the registration area.   Wear comfortable clothing and clothing appropriate for easy access to any Portacath or PICC line.   We strive to give you quality time with your provider. You may need to reschedule your appointment if you arrive late (15 or more minutes).  Arriving late affects you and other patients whose appointments are after yours.  Also, if you miss three or more appointments without notifying the office, you may be dismissed from the clinic at the providers discretion.      For prescription refill requests, have your pharmacy contact our office and allow 72 hours for refills to be completed.    Today you received the following chemotherapy and/or immunotherapy agents: DOXOrubicin , cyclophosphamide     To help prevent nausea and vomiting after your treatment, we encourage you to take your nausea medication as directed.  BELOW ARE SYMPTOMS THAT SHOULD BE REPORTED IMMEDIATELY: *FEVER GREATER THAN 100.4 F (38 C) OR HIGHER *CHILLS OR SWEATING *NAUSEA AND VOMITING THAT IS NOT CONTROLLED WITH YOUR NAUSEA MEDICATION *UNUSUAL SHORTNESS OF BREATH *UNUSUAL BRUISING OR BLEEDING *URINARY PROBLEMS (pain or burning when urinating, or frequent urination) *BOWEL PROBLEMS (unusual diarrhea, constipation, pain near the anus) TENDERNESS IN MOUTH AND THROAT WITH OR WITHOUT PRESENCE OF ULCERS (sore throat, sores in mouth, or a toothache) UNUSUAL RASH, SWELLING OR PAIN  UNUSUAL VAGINAL DISCHARGE OR ITCHING   Items with * indicate a potential emergency and should be followed up as soon as possible or go to the Emergency Department if any problems should occur.  Please show the CHEMOTHERAPY ALERT CARD  or IMMUNOTHERAPY ALERT CARD at check-in to the Emergency Department and triage nurse.  Should you have questions after your visit or need to cancel or reschedule your appointment, please contact [facility] - A DEPT OF [facility]  Dept: [phone]  and follow the prompts.  Office hours are 8:00 a.m. to 4:30 p.m. Monday - Friday. Please note that voicemails left after 4:00 p.m. may not be returned until the following business day.  We are closed weekends and major holidays. You have access to a nurse at all times for urgent questions. Please call the main number to the clinic Dept: [phone] and follow the prompts.   For any non-urgent questions, you may also contact your provider using MyChart. We now offer e-Visits for anyone 32 and older to request care online for non-urgent symptoms. For details visit [email or web address].   Also download the MyChart app! Go to the app store, search MyChart, open the app, select [place], and log in with your MyChart username and password.

## 2024-04-22 NOTE — Progress Notes (Signed)
 Per Powell NP OK to treat & use Port if blood return per Dr [name].  Blood return obtain & infusing well.

## 2024-04-23 ENCOUNTER — Encounter: Payer: Self-pay | Admitting: Nurse Practitioner

## 2024-04-23 ENCOUNTER — Encounter: Payer: Self-pay | Admitting: Hematology

## 2024-04-24 ENCOUNTER — Other Ambulatory Visit: Payer: Self-pay

## 2024-04-24 ENCOUNTER — Other Ambulatory Visit: Payer: Self-pay | Admitting: Nurse Practitioner

## 2024-04-24 ENCOUNTER — Encounter: Payer: Self-pay | Admitting: Nurse Practitioner

## 2024-04-24 ENCOUNTER — Inpatient Hospital Stay: Admitting: Licensed Clinical Social Worker

## 2024-04-24 ENCOUNTER — Inpatient Hospital Stay

## 2024-04-24 DIAGNOSIS — C50412 Malignant neoplasm of upper-outer quadrant of left female breast: Secondary | ICD-10-CM

## 2024-04-24 DIAGNOSIS — Z5111 Encounter for antineoplastic chemotherapy: Secondary | ICD-10-CM | POA: Diagnosis not present

## 2024-04-24 MED ORDER — ALBUTEROL SULFATE HFA 108 (90 BASE) MCG/ACT IN AERS: 1.0000 | INHALATION_SPRAY | Freq: Four times a day (QID) | RESPIRATORY_TRACT | 2 refills | Status: AC | PRN

## 2024-04-24 MED ORDER — AMITRIPTYLINE HCL 50 MG PO TABS: 50.0000 mg | ORAL_TABLET | Freq: Every day | ORAL | 1 refills | Status: DC

## 2024-04-24 MED ORDER — PEGFILGRASTIM-CBQV 6 MG/0.6ML ~~LOC~~ SOSY
6.0000 mg | PREFILLED_SYRINGE | Freq: Once | SUBCUTANEOUS | Status: AC
  Administered 2024-04-24: 6 mg via SUBCUTANEOUS
  Filled 2024-04-24: qty 0.6

## 2024-04-24 NOTE — Progress Notes (Signed)
 [facility] CSW Progress Note  Clinical Child Psychotherapist contacted patient by phone to follow-up on financial concerns.    Interventions: CSW informed pt the Monica Holland has re-opened.  Pt provided CSW w/ details needed to complete the application.  Application submitted on behalf of pt.  Reminder email sent to Cancer Services regarding need for financial assistance as pt reports she has not yet heard from them.  CSW to remain available as appropriate to provide support throughout duration of treatment.       Follow Up Plan:  Patient will contact CSW with any support or resource needs    Monica JONELLE Manna, LCSW Clinical Social Worker [facility]    Patient is participating in a Managed Medicaid Plan:  Yes

## 2024-04-25 ENCOUNTER — Other Ambulatory Visit

## 2024-04-28 ENCOUNTER — Telehealth: Payer: Self-pay

## 2024-04-28 NOTE — Telephone Encounter (Signed)
 LV[M for  the pt significant other in regards to her FMLA form being completed,faxed, and confirmation received. Left call back number.

## 2024-05-06 ENCOUNTER — Ambulatory Visit: Payer: Self-pay | Admitting: Obstetrics and Gynecology

## 2024-05-06 ENCOUNTER — Other Ambulatory Visit: Payer: Self-pay

## 2024-05-06 ENCOUNTER — Encounter: Payer: Self-pay | Admitting: Obstetrics and Gynecology

## 2024-05-06 VITALS — Wt 182.0 lb

## 2024-05-06 DIAGNOSIS — Z30432 Encounter for removal of intrauterine contraceptive device: Secondary | ICD-10-CM

## 2024-05-06 DIAGNOSIS — C50412 Malignant neoplasm of upper-outer quadrant of left female breast: Secondary | ICD-10-CM

## 2024-05-06 DIAGNOSIS — Z170 Estrogen receptor positive status [ER+]: Secondary | ICD-10-CM

## 2024-05-06 DIAGNOSIS — N939 Abnormal uterine and vaginal bleeding, unspecified: Secondary | ICD-10-CM | POA: Diagnosis not present

## 2024-05-06 MED FILL — Fosaprepitant Dimeglumine For IV Infusion 150 MG (Base Eq): INTRAVENOUS | Qty: 5 | Status: AC

## 2024-05-06 NOTE — Progress Notes (Signed)
 "  GYNECOLOGY OFFICE VISIT NOTE  History:  Monica Holland is a 39 y.o. G4P4000 here today for IUD removal per the recommendation of her oncologist. She was diagnosed with breast cancer in December 2025 after presenting with a palpable mass. She had imaging and biopsy which showed left breast cancer.  She had genetic testing which was negative. The breast cancer is ER/PR positive.   We discussed most immediate is recommendation for removal of IUD. She had this placed at the time of her ablation due to heavy bleeding on Eliquis . This was done 12/2021 and after the Novasure ablation we also placed the IUD. We had planned if she bled despite these measures, then next step would be hysterectomy (ideally TL[facility] based on my note).    The following portions of the patient's history were reviewed and updated as appropriate: allergies, current medications, past family history, past medical history, past social history, past surgical history and problem list.   Health Maintenance:   Normal pap and negative HRHPV:   Diagnosis  Date Value Ref Range Status  10/26/2021   Final   - Negative for Intraepithelial Lesions or Malignancy (NILM)  10/26/2021 - Benign reactive/reparative changes  Final   Review of Systems:  Pertinent items noted in HPI and remainder of comprehensive ROS otherwise negative.  Physical Exam:  Wt 182 lb (82.6 kg)   BMI 25.38 kg/m  CONSTITUTIONAL: Well-developed, well-nourished female in no acute distress.  HEENT:  Normocephalic, atraumatic. External right and left ear normal. No scleral icterus.  NECK: Normal range of motion, supple, no masses noted on observation SKIN: No rash noted. Not diaphoretic. No erythema. No pallor. MUSCULOSKELETAL: Normal range of motion. No edema noted. NEUROLOGIC: Alert and oriented to person, place, and time. Normal muscle tone coordination. No cranial nerve deficit noted. PSYCHIATRIC: Normal mood and affect. Normal behavior. Normal judgment and  thought content.  GYNECOLOGY OFFICE PROCEDURE NOTE  IUD Removal  Patient identified, informed consent performed, consent signed.  Patient was in the dorsal lithotomy position, normal external genitalia was noted.  A speculum was placed in the patient's vagina, normal discharge was noted, no lesions. The cervix was visualized, no lesions, no abnormal discharge.  The strings of the IUD were grasped and pulled using ring forceps. The IUD was removed in its entirety.   Patient tolerated the procedure well.    Assessment and Plan:  1. Abnormal uterine bleeding (AUB) (Primary) Discussed hopefully ablation will suffice without IUD present. If not, we discussed alternatives namely hysterectomy would be the remaining option.   2. Malignant neoplasm of upper-outer quadrant of left breast in female, estrogen receptor positive (HCC) Genetic testing negative.  Discussed potential for removal of ovaries even if hyst is not needed (would also negate the need for hysterectomy). This sometimes provides additional options I.e. Arimidex. Discussed the risk of  breast cancer recurrence should also be weighed against her risk of heart disease lifelong as removing her ovaries would also place her into early menopause which would increase her risk for menopause. And unfortunately due to ER pos breast cancer and VTE history, I would not advise HT replacement in this case.  We discussed checking FSH in one year when she would come for annual. We also discussed other reasons to return sooner (I.e. oncologist recommends, bleeding, etc)  Routine preventative health maintenance measures emphasized. Please refer to After Visit Summary for other counseling recommendations.   Return in about 1 year (around 05/06/2025) for annual.  Vina Solian, MD, FACOG  Obstetrician & Gynecologist, Biochemist, Clinical for [employer], [facility]      "

## 2024-05-06 NOTE — Assessment & Plan Note (Signed)
 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  -Due to her young age and node positive disease, I recommend neoadjuvant chemotherapy AC-T, she started on 04/08/2024.

## 2024-05-07 ENCOUNTER — Inpatient Hospital Stay: Admitting: Hematology

## 2024-05-07 ENCOUNTER — Other Ambulatory Visit: Payer: Self-pay | Admitting: Family

## 2024-05-07 ENCOUNTER — Inpatient Hospital Stay

## 2024-05-07 DIAGNOSIS — C50412 Malignant neoplasm of upper-outer quadrant of left female breast: Secondary | ICD-10-CM

## 2024-05-07 DIAGNOSIS — K219 Gastro-esophageal reflux disease without esophagitis: Secondary | ICD-10-CM

## 2024-05-08 ENCOUNTER — Telehealth: Payer: Self-pay | Admitting: *Deleted

## 2024-05-08 ENCOUNTER — Encounter: Payer: Self-pay | Admitting: Hematology

## 2024-05-08 ENCOUNTER — Other Ambulatory Visit (HOSPITAL_COMMUNITY): Payer: Self-pay

## 2024-05-08 ENCOUNTER — Inpatient Hospital Stay

## 2024-05-08 ENCOUNTER — Other Ambulatory Visit: Payer: Self-pay

## 2024-05-08 ENCOUNTER — Inpatient Hospital Stay (HOSPITAL_BASED_OUTPATIENT_CLINIC_OR_DEPARTMENT_OTHER): Admitting: Hematology

## 2024-05-08 VITALS — BP 110/74 | HR 95 | Temp 98.7°F | Resp 16 | Ht 71.0 in | Wt 179.1 lb

## 2024-05-08 DIAGNOSIS — C50412 Malignant neoplasm of upper-outer quadrant of left female breast: Secondary | ICD-10-CM

## 2024-05-08 DIAGNOSIS — Z5111 Encounter for antineoplastic chemotherapy: Secondary | ICD-10-CM | POA: Diagnosis not present

## 2024-05-08 DIAGNOSIS — Z01818 Encounter for other preprocedural examination: Secondary | ICD-10-CM

## 2024-05-08 DIAGNOSIS — Z170 Estrogen receptor positive status [ER+]: Secondary | ICD-10-CM | POA: Diagnosis not present

## 2024-05-08 LAB — CBC WITH DIFFERENTIAL (CANCER CENTER ONLY)
Abs Immature Granulocytes: 0.1 K/uL — ABNORMAL HIGH (ref 0.00–0.07)
Basophils Absolute: 0.1 K/uL (ref 0.0–0.1)
Basophils Relative: 2 %
Eosinophils Absolute: 0.1 K/uL (ref 0.0–0.5)
Eosinophils Relative: 3 %
HCT: 37.3 % (ref 36.0–46.0)
Hemoglobin: 12.8 g/dL (ref 12.0–15.0)
Immature Granulocytes: 2 %
Lymphocytes Relative: 24 %
Lymphs Abs: 1.1 K/uL (ref 0.7–4.0)
MCH: 28.2 pg (ref 26.0–34.0)
MCHC: 34.3 g/dL (ref 30.0–36.0)
MCV: 82.2 fL (ref 80.0–100.0)
Monocytes Absolute: 0.7 K/uL (ref 0.1–1.0)
Monocytes Relative: 16 %
Neutro Abs: 2.4 K/uL (ref 1.7–7.7)
Neutrophils Relative %: 53 %
Platelet Count: 249 K/uL (ref 150–400)
RBC: 4.54 MIL/uL (ref 3.87–5.11)
RDW: 15.2 % (ref 11.5–15.5)
WBC Count: 4.5 K/uL (ref 4.0–10.5)
nRBC: 0 % (ref 0.0–0.2)

## 2024-05-08 LAB — CMP (CANCER CENTER ONLY)
ALT: 24 U/L (ref 0–44)
AST: 23 U/L (ref 15–41)
Albumin: 4.4 g/dL (ref 3.5–5.0)
Alkaline Phosphatase: 80 U/L (ref 38–126)
Anion gap: 9 (ref 5–15)
BUN: 10 mg/dL (ref 6–20)
CO2: 29 mmol/L (ref 22–32)
Calcium: 9.3 mg/dL (ref 8.9–10.3)
Chloride: 106 mmol/L (ref 98–111)
Creatinine: 0.91 mg/dL (ref 0.44–1.00)
GFR, Estimated: >60 mL/min
Glucose, Bld: 114 mg/dL — ABNORMAL HIGH (ref 70–99)
Potassium: 4.2 mmol/L (ref 3.5–5.1)
Sodium: 143 mmol/L (ref 135–145)
Total Bilirubin: 0.3 mg/dL (ref 0.0–1.2)
Total Protein: 7 g/dL (ref 6.5–8.1)

## 2024-05-08 MED ORDER — SUCRALFATE 1 G PO TABS
1.0000 g | ORAL_TABLET | Freq: Four times a day (QID) | ORAL | 1 refills | Status: AC
  Filled 2024-05-08: qty 60, 15d supply, fill #0

## 2024-05-08 MED ORDER — SODIUM CHLORIDE 0.9 % IV SOLN
150.0000 mg | Freq: Once | INTRAVENOUS | Status: AC
  Administered 2024-05-08: 150 mg via INTRAVENOUS
  Filled 2024-05-08: qty 150
  Filled 2024-05-08: qty 5

## 2024-05-08 MED ORDER — DEXAMETHASONE SOD PHOSPHATE PF 10 MG/ML IJ SOLN
10.0000 mg | Freq: Once | INTRAMUSCULAR | Status: AC
  Administered 2024-05-08: 10 mg via INTRAVENOUS
  Filled 2024-05-08: qty 1

## 2024-05-08 MED ORDER — DOXORUBICIN HCL CHEMO IV INJECTION 2 MG/ML
60.0000 mg/m2 | Freq: Once | INTRAVENOUS | Status: AC
  Administered 2024-05-08: 124 mg via INTRAVENOUS
  Filled 2024-05-08: qty 62

## 2024-05-08 MED ORDER — SODIUM CHLORIDE 0.9 % IV SOLN: INTRAVENOUS | Status: DC

## 2024-05-08 MED ORDER — PALONOSETRON HCL INJECTION 0.25 MG/5ML
0.2500 mg | Freq: Once | INTRAVENOUS | Status: AC
  Administered 2024-05-08: 0.25 mg via INTRAVENOUS
  Filled 2024-05-08: qty 5

## 2024-05-08 MED ORDER — SODIUM CHLORIDE 0.9 % IV SOLN
600.0000 mg/m2 | Freq: Once | INTRAVENOUS | Status: AC
  Administered 2024-05-08: 1240 mg via INTRAVENOUS
  Filled 2024-05-08: qty 62

## 2024-05-08 MED ORDER — NYSTATIN 100000 UNIT/ML MT SUSP
5.0000 mL | Freq: Four times a day (QID) | OROMUCOSAL | 1 refills | Status: AC | PRN
  Filled 2024-05-08: qty 140, 7d supply, fill #0
  Filled 2024-05-23: qty 140, 7d supply, fill #1

## 2024-05-08 NOTE — Assessment & Plan Note (Signed)
 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  -Due to her young age and node positive disease, I recommend neoadjuvant chemotherapy AC-T, she started on 04/08/2024.

## 2024-05-08 NOTE — Telephone Encounter (Signed)
 Ordered placed.

## 2024-05-08 NOTE — Telephone Encounter (Signed)
 Hello, Monica Holland. [name] is out so I hope you can help me get her an OP echo in the next 2 weeks. It can be at any location   RN asked  what was the diagnosis  Per Dr Francyne It is for chemo. Yes, please enter the order.

## 2024-05-08 NOTE — Patient Instructions (Signed)
 [facility] - A DEPT OF [facility]  Discharge Instructions: Thank you for choosing [facility] to provide your oncology and hematology care.   If you have a lab appointment with the Cancer Center, please go directly to the Cancer Center and check in at the registration area.   Wear comfortable clothing and clothing appropriate for easy access to any Portacath or PICC line.   We strive to give you quality time with your provider. You may need to reschedule your appointment if you arrive late (15 or more minutes).  Arriving late affects you and other patients whose appointments are after yours.  Also, if you miss three or more appointments without notifying the office, you may be dismissed from the clinic at the provider's discretion.      For prescription refill requests, have your pharmacy contact our office and allow 72 hours for refills to be completed.    Today you received the following chemotherapy and/or immunotherapy agents: Doxorubicin  (Adriamycin ) & Cyclophosphamide  (Cytoxan )     To help prevent nausea and vomiting after your treatment, we encourage you to take your nausea medication as directed.  BELOW ARE SYMPTOMS THAT SHOULD BE REPORTED IMMEDIATELY: *FEVER GREATER THAN 100.4 F (38 C) OR HIGHER *CHILLS OR SWEATING *NAUSEA AND VOMITING THAT IS NOT CONTROLLED WITH YOUR NAUSEA MEDICATION *UNUSUAL SHORTNESS OF BREATH *UNUSUAL BRUISING OR BLEEDING *URINARY PROBLEMS (pain or burning when urinating, or frequent urination) *BOWEL PROBLEMS (unusual diarrhea, constipation, pain near the anus) TENDERNESS IN MOUTH AND THROAT WITH OR WITHOUT PRESENCE OF ULCERS (sore throat, sores in mouth, or a toothache) UNUSUAL RASH, SWELLING OR PAIN  UNUSUAL VAGINAL DISCHARGE OR ITCHING   Items with * indicate a potential emergency and should be followed up as soon as possible or go to the Emergency Department if any problems should occur.  Please show  the CHEMOTHERAPY ALERT CARD or IMMUNOTHERAPY ALERT CARD at check-in to the Emergency Department and triage nurse.  Should you have questions after your visit or need to cancel or reschedule your appointment, please contact [facility] - A DEPT OF [facility]  Dept: [phone]  and follow the prompts.  Office hours are 8:00 a.m. to 4:30 p.m. Monday - Friday. Please note that voicemails left after 4:00 p.m. may not be returned until the following business day.  We are closed weekends and major holidays. You have access to a nurse at all times for urgent questions. Please call the main number to the clinic Dept: [phone] and follow the prompts.   For any non-urgent questions, you may also contact your provider using MyChart. We now offer e-Visits for anyone 110 and older to request care online for non-urgent symptoms. For details visit [email or web address].   Also download the MyChart app! Go to the app store, search MyChart, open the app, select [facility], and log in with your MyChart username and password.

## 2024-05-08 NOTE — Progress Notes (Signed)
 Verbal order w/readback from Dr Lanny for Magic Mouthwash w/Lidocaine  Swish & Swallow 1:1 Equal parts of Lidocaine  Viscous, Maalox ES, Diphenhydramine, and Nystatin  w/1 refill.  Prescription sent to [facility] on [address] per pt's request.

## 2024-05-09 ENCOUNTER — Inpatient Hospital Stay

## 2024-05-09 ENCOUNTER — Ambulatory Visit (HOSPITAL_COMMUNITY)
Admission: RE | Admit: 2024-05-09 | Discharge: 2024-05-09 | Disposition: A | Source: Ambulatory Visit | Attending: Cardiovascular Disease | Admitting: Cardiovascular Disease

## 2024-05-09 ENCOUNTER — Other Ambulatory Visit (HOSPITAL_COMMUNITY): Payer: Self-pay

## 2024-05-09 ENCOUNTER — Ambulatory Visit: Payer: Self-pay | Admitting: Cardiovascular Disease

## 2024-05-09 VITALS — BP 133/80 | HR 85 | Resp 16

## 2024-05-09 DIAGNOSIS — Z01818 Encounter for other preprocedural examination: Secondary | ICD-10-CM | POA: Diagnosis present

## 2024-05-09 DIAGNOSIS — C50412 Malignant neoplasm of upper-outer quadrant of left female breast: Secondary | ICD-10-CM | POA: Diagnosis present

## 2024-05-09 DIAGNOSIS — Z79899 Other long term (current) drug therapy: Secondary | ICD-10-CM | POA: Diagnosis not present

## 2024-05-09 DIAGNOSIS — Z170 Estrogen receptor positive status [ER+]: Secondary | ICD-10-CM | POA: Insufficient documentation

## 2024-05-09 DIAGNOSIS — Z01810 Encounter for preprocedural cardiovascular examination: Secondary | ICD-10-CM | POA: Diagnosis not present

## 2024-05-09 DIAGNOSIS — Z5111 Encounter for antineoplastic chemotherapy: Secondary | ICD-10-CM | POA: Diagnosis not present

## 2024-05-09 LAB — ECHOCARDIOGRAM COMPLETE
Area-P 1/2: 3.47 cm2
S' Lateral: 2.7 cm

## 2024-05-09 MED ORDER — PEGFILGRASTIM-CBQV 6 MG/0.6ML ~~LOC~~ SOSY
6.0000 mg | PREFILLED_SYRINGE | Freq: Once | SUBCUTANEOUS | Status: AC
  Administered 2024-05-09: 6 mg via SUBCUTANEOUS

## 2024-05-09 NOTE — Patient Instructions (Signed)

## 2024-05-10 ENCOUNTER — Encounter: Payer: Self-pay | Admitting: Hematology

## 2024-05-10 ENCOUNTER — Other Ambulatory Visit: Payer: Self-pay | Admitting: Hematology

## 2024-05-10 ENCOUNTER — Inpatient Hospital Stay

## 2024-05-10 NOTE — Progress Notes (Signed)
 " [facility]   [phone] [phone]   Clinic Follow up Note   Patient Care Team: [facility], [facility] as PCP - General [name], [name], MD as PCP - Cardiology (Cardiology) Franchot [name], NP as Nurse Practitioner (Nurse Practitioner) Tyree [name], RN as Oncology Nurse Navigator Curvin [name], MD as Consulting Physician (General Surgery) [name], MD as Consulting Physician (Hematology) [name], MD as Consulting Physician (Radiation Oncology)  Date of Service:  05/08/2024  CHIEF COMPLAINT: f/u of left breast cancer  CURRENT THERAPY:  Neoadjuvant chemotherapy AC  Oncology History   Malignant neoplasm of upper-outer quadrant of left breast in female, 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  -Due to her young age and node positive disease, I recommend neoadjuvant chemotherapy AC-T, she started on 04/08/2024.   Assessment & Plan Estrogen receptor positive malignant neoplasm of upper-outer quadrant of left breast She is undergoing active chemotherapy (third cycle of AC regimen) for estrogen receptor positive breast cancer. There is clinical improvement in the palpable mass, with decreased size and resolution of swelling and warmth. She is scheduled for one additional cycle of AC before transitioning to Taxol. She has undergone cervical ablation and recent IUD removal due to hormone receptor positivity, with no plans for future childbearing. No abnormal vaginal bleeding is present.  Cardiac monitoring is indicated due to risk of adriamycin -induced cardiotoxicity, and she maintains ongoing surveillance with her cardiologist. - Ordered echocardiogram for adriamycin -induced cardiotoxicity surveillance, coordinated with her cardiologist. - Scheduled next chemotherapy cycle; instructed her to confirm scheduling prior to departure. - Planned transition to Taxol following completion of AC regimen. - Instructed to monitor for abnormal vaginal bleeding and report if present.  Chemotherapy-induced oral mucositis and esophagitis She has persistent oral soreness and esophageal discomfort following chemotherapy, consistent with mucositis and esophagitis. Oral exam reveals whitish changes and dental discomfort without ulcerations. Current acid suppression therapy is insufficient. New dental pain warrants evaluation. Supportive care for associated skin and sinus symptoms was reviewed. - Prescribed compounded 'magic mouthwash' (steroid, diphenhydramine, nystatin , lidocaine ) 4-5 times daily; instructed to rinse and spit, and swallow half the doses for esophageal symptoms. - Prescribed sucralfate  tablets for esophageal coating and pain relief; prescription sent to preferred pharmacy. - Advised regular use of non-alcoholic mouthwash and maintenance of oral hygiene with brushing and mouthwash. - Instructed to follow up with dentist regarding dental pain and to inform her of ongoing chemotherapy. - Recommended over-the-counter hydrocortisone or diclofenac gel for skin cracking and pain on hands. - Reviewed supportive care for sinus congestion: continue fluticasone  and loratadine ; no antibiotics unless symptoms worsen.   Plan - Chemo side effects and management reviewed with patient - Will call in medical mouthwash for her - Lab reviewed, adequate for treatment, will proceed cycle 3 AC today - Lab, follow-up and cycle 4 AC in 2 weeks - I messaged her cardiologist to repeat ECHO in next two  weeks  SUMMARY OF ONCOLOGIC HISTORY: Oncology History  Malignant neoplasm of upper-outer quadrant of left breast in female, estrogen receptor positive (  HCC)  03/19/2024 Cancer Staging   Staging form: Breast, AJCC 8th Edition - Clinical stage from 03/19/2024: Stage IIA (cT2, cN1, cM0, G2, ER+, PR+, HER2-) - Signed by [name], MD on 03/26/2024 Stage prefix: Initial diagnosis Histologic grading system: 3 grade system   03/24/2024 Initial Diagnosis   Malignant neoplasm of upper-outer quadrant of left breast in female, estrogen receptor positive (HCC)   04/08/2024 -  Chemotherapy   Patient is on Treatment Plan : BREAST DOSE DENSE AC q14d / PACLitaxel q7d     04/08/2024 Genetic Testing   Negative genetic testing. AXIN2  p.S796G (c.2386A>G)  and SMAD4 p.I228V (c.682A>G)  VUS The report date is April 08, 2024.  The CancerNext-Expanded gene panel offered by [facility] and includes sequencing, rearrangement, and RNA analysis for the following 77 genes: AIP, ALK, APC, ATM, BAP1, BARD1, BMPR1A, BRCA1, BRCA2, BRIP1, CDC73, CDH1, CDK4, CDKN1B, CDKN2A, CEBPA, CHEK2, CTNNA1, DDX41, DICER1, ETV6, FH, FLCN, GATA2, LZTR1, MAX, MBD4, MEN1, MET, MLH1, MSH2, MSH3, MSH6, MUTYH, NF1, NF2, NTHL1, PALB2, PHOX2B, PMS2, POT1, PRKAR1A, PTCH1, PTEN, RAD51C, RAD51D, RB1, RET, RPS20, RUNX1, SDHA, SDHAF2, SDHB, SDHC, SDHD, SMAD4, SMARCA4, SMARCB1, SMARCE1, STK11, SUFU, TMEM127, TP53, TSC1, TSC2, VHL, and WT1 (sequencing and deletion/duplication); AXIN2, CTNNA1, DDX41, EGFR, HOXB13, KIT, MBD4, MITF, MSH3, PDGFRA, POLD1 and POLE (sequencing only); EPCAM and GREM1 (deletion/duplication only). RNA data is routinely analyzed for use in variant interpretation for all genes.       Discussed the use of AI scribe software for clinical note transcription with the patient, who gave verbal consent to proceed.  History of Present Illness Monica Holland is a 39 year old female with estrogen receptor-positive left breast  cancer undergoing AC chemotherapy who presents for routine oncology follow-up and management of chemotherapy-induced mucositis and esophagitis.  She is receiving her third cycle of AC with one cycle remaining before transition to Taxol. She notes her left breast mass is smaller and no longer painful. She denies abnormal bleeding, bruising, nausea, or diarrhea.  Since the last chemotherapy cycle she has had persistent oral mucositis with soreness of the hard palate and new dental pain. She is not using medicated mouthwash. She had intermittent esophageal discomfort with odynophagia and sensation of food impaction, now resolved. She continues pantoprazole  and famotidine .  For the past three days she has had nasal congestion, myalgias, intermittent headaches, and sensation of nasal discharge without fever, consistent with an upper respiratory infection. She uses Flonase  and loratadine  with partial relief.  She has xerosis and fissuring of the skin on her hands, worse with frequent washing, causing pain and asks about topical treatment options.  She is amenorrheic following cervical ablation and recent IUD removal due to hormone receptor-positive disease. She does not desire future pregnancy. She follows with cardiology for prior possible cardiac issues, which is relevant to anthracycline therapy.     All other systems were reviewed with the patient and are negative.  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   Peripheral vascular disease    Wears glasses     SURGICAL HISTORY: Past Surgical History:  Procedure Laterality Date   ANKLE SURGERY Right 2014   ARTHROSCOPY KNEE W/ DRILLING Left 10/2023   BREAST BIOPSY Left 03/19/2024   US  LT BREAST BX W LOC DEV EA ADD LESION IMG BX SPEC US  GUIDE 03/19/2024 [facility]   BREAST BIOPSY Left 03/19/2024   US  LT BREAST BX W LOC DEV 1ST LESION IMG BX SPEC US  GUIDE 03/19/2024 [facility]   BREAST BIOPSY Left 03/19/2024   US  LT BREAST BX W LOC DEV EA ADD LESION IMG BX SPEC US  GUIDE 03/19/2024 [facility]   BREAST BIOPSY Right 04/14/2024   US  RT BREAST BX W LOC DEV 1ST LESION IMG BX SPEC US  GUIDE 04/14/2024 [facility]   DILITATION & CURRETTAGE/HYSTROSCOPY WITH NOVASURE ABLATION N/A 01/04/2022   Procedure: DILATATION & CURETTAGE/HYSTEROSCOPY WITH NOVASURE ABLATION;  Surgeon: Cleatus Moccasin, MD;  Location: [facility];  Service: Gynecology;  Laterality: N/A;   ESOPHAGOGASTRODUODENOSCOPY (EGD) WITH PROPOFOL   12/29/2021   by dr federico   INTRAUTERINE DEVICE (IUD) INSERTION N/A 01/04/2022   Procedure: INTRAUTERINE DEVICE (IUD) INSERTION;  Surgeon: Cleatus Moccasin, MD;  Location: [facility];  Service: Gynecology;  Laterality: N/A;   PORTACATH PLACEMENT N/A 03/31/2024   Procedure: INSERTION, TUNNELED CENTRAL VENOUS DEVICE, WITH PORT;  Surgeon: Curvin [name], MD;  Location: [facility];  Service: General;  Laterality: N/A;  PORT PLACEMENT WITH ULTRASOUND  GUIDANCE   ROTATOR CUFF REPAIR Left 12/2023   SHOULDER ARTHROSCOPY Right 2024    I have reviewed the social history and family history with the patient and they are unchanged from previous note.  ALLERGIES:  is allergic to nsaids.  MEDICATIONS:  Current Outpatient Medications  Medication Sig Dispense Refill   sucralfate  (CARAFATE ) 1 g tablet Take 1 tablet (1 g total) by mouth 4 (four) times daily. 60 tablet 1   acetaminophen  (TYLENOL ) 325 MG tablet Take 2 tablets (650 mg total) by mouth every 6 (six) hours as needed for mild pain (pain score 1-3), fever or headache.     albuterol  (VENTOLIN  HFA) 108 (90 Base) MCG/ACT inhaler Inhale 1-2 puffs into the lungs every 6 (six) hours as needed for wheezing or shortness of breath. 8 g 2   amitriptyline  (ELAVIL ) 50 MG tablet Take 1 tablet (50 mg total) by mouth at bedtime. 30 tablet 1   dabigatran  (PRADAXA ) 150 MG CAPS capsule Take 1 capsule (150 mg total) by mouth 2 (two) times daily. 60 capsule 2   enoxaparin  (LOVENOX ) 100 MG/ML injection Inject 0.85 mLs (85 mg total) into the skin every 12 (twelve) hours. 2.55 mL 0   famotidine  (PEPCID ) 20 MG tablet Take 20 mg by mouth 2 (two) times daily.     fluticasone  (FLONASE ) 50 MCG/ACT nasal spray Place 2 sprays into both nostrils daily. 9.9 mL 2  gabapentin  (NEURONTIN ) 100 MG capsule Take 2 capsules (200 mg total) by mouth 2 (two) times daily. 120 capsule 1   loratadine  (CLARITIN ) 10 MG tablet Take 10 mg by mouth every morning.     magic mouthwash (nystatin , lidocaine , diphenhydrAMINE, alum & mag hydroxide) suspension Swish and swallow 5 mLs by mouth 4 (four) times daily as needed for mouth pain. 140 mL 1   ondansetron  (ZOFRAN ) 8 MG tablet Take 1 tab (8 mg) by mouth every 8 hrs as needed for nausea/vomiting. Start third day after doxorubicin /cyclophosphamide  chemotherapy. 30 tablet 1   oxyCODONE  (ROXICODONE ) 5 MG immediate release tablet Take 1 tablet (5 mg total) by mouth every 6 (six) hours as needed. 30  tablet 0   pantoprazole  (PROTONIX ) 40 MG tablet TAKE 1 TABLET BY MOUTH TWICE A DAY 180 tablet 1   polyethylene glycol (MIRALAX  / GLYCOLAX ) 17 g packet Take 17 g by mouth daily. 14 each 0   senna (SENOKOT) 8.6 MG TABS tablet Take 2 tablets (17.2 mg total) by mouth at bedtime. 120 tablet 0   No current facility-administered medications for this visit.    PHYSICAL EXAMINATION: ECOG PERFORMANCE STATUS: 1 - Symptomatic but completely ambulatory  Vitals:   05/08/24 1309  BP: 110/74  Pulse: 95  Resp: 16  Temp: 98.7 F (37.1 C)  SpO2: 97%   Wt Readings from Last 3 Encounters:  05/08/24 179 lb 1.6 oz (81.2 kg)  05/06/24 182 lb (82.6 kg)  04/22/24 188 lb 6.4 oz (85.5 kg)     GENERAL:alert, no distress and comfortable SKIN: skin color, texture, turgor are normal, no rashes or significant lesions ORAL mild mucositis, no ulcers. EYES: normal, Conjunctiva are pink and non-injected, sclera clear NECK: supple, thyroid normal size, non-tender, without nodularity LYMPH:  no palpable lymphadenopathy in the cervical, axillary  LUNGS: clear to auscultation and percussion with normal breathing effort HEART: regular rate & rhythm and no murmurs and no lower extremity edema ABDOMEN:abdomen soft, non-tender and normal bowel sounds Musculoskeletal:no cyanosis of digits and no clubbing  NEURO: alert & oriented x 3 with fluent speech, no focal motor/sensory deficits  Physical Exam   LABORATORY DATA:  I have reviewed the data as listed    Latest Ref Rng & Units 05/08/2024   12:26 PM 04/22/2024   12:56 PM 04/18/2024    7:27 AM  CBC  WBC 4.0 - 10.5 K/uL 4.5  7.6  8.7   Hemoglobin 12.0 - 15.0 g/dL 87.1  87.5  86.2   Hematocrit 36.0 - 46.0 % 37.3  36.9  42.1   Platelets 150 - 400 K/uL 249  228  122         Latest Ref Rng & Units 05/08/2024   12:26 PM 04/22/2024   12:56 PM 04/16/2024    5:08 AM  CMP  Glucose 70 - 99 mg/dL 885  888  893   BUN 6 - 20 mg/dL 10  11  9    Creatinine 0.44 - 1.00 mg/dL  9.08  9.18  9.17   Sodium 135 - 145 mmol/L 143  138  138   Potassium 3.5 - 5.1 mmol/L 4.2  3.7  4.5   Chloride 98 - 111 mmol/L 106  106  104   CO2 22 - 32 mmol/L 29  24  25    Calcium 8.9 - 10.3 mg/dL 9.3  9.4  9.4   Total Protein 6.5 - 8.1 g/dL 7.0  7.0    Total Bilirubin 0.0 - 1.2 mg/dL 0.3  <  0.2    Alkaline Phos 38 - 126 U/L 80  75    AST 15 - 41 U/L 23  29    ALT 0 - 44 U/L 24  68        RADIOGRAPHIC STUDIES: I have personally reviewed the radiological images as listed and agreed with the findings in the report. ECHOCARDIOGRAM COMPLETE Result Date: 05/09/2024    ECHOCARDIOGRAM REPORT   Patient Name:   Monica Holland Date of Exam: 05/09/2024 Medical Rec #:  [id number]        Height:       70.9 in Accession #:    [id number]       Weight:       179.0 lb Date of Birth:  06-20-1985       BSA:          2.009 m Patient Age:    38 years         BP:           110/74 mmHg Patient Gender: F                HR:           89 bpm. Exam Location:  [address] Procedure: 2D Echo, Cardiac Doppler, Color Doppler, 3D Echo and Strain Analysis            (Both Spectral and Color Flow Doppler were utilized during            procedure). Indications:    Chemo Z09  History:        Patient has prior history of Echocardiogram examinations, most                 recent 04/03/2024. Breast Cancer.  Sonographer:    Augustin Seals RDCS Referring Phys: [phone] MIHAI [name] IMPRESSIONS  1. Left ventricular ejection fraction, by estimation, is 60 to 65%. Left ventricular ejection fraction by 3D volume is 62 %. The left ventricle has normal function. The left ventricle has no regional wall motion abnormalities. Left ventricular diastolic  parameters were normal. The average left ventricular global longitudinal strain is -21.3 %. The global longitudinal strain is normal.  2. Right ventricular systolic function is normal. The right ventricular size is normal.  3. The mitral valve is normal in structure. No evidence of mitral  valve regurgitation. No evidence of mitral stenosis.  4. The aortic valve is tricuspid. Aortic valve regurgitation is not visualized. No aortic stenosis is present.  5. The inferior vena cava is normal in size with greater than 50% respiratory variability, suggesting right atrial pressure of 3 mmHg. Comparison(s): A prior study was performed on 04/03/2024. LVEF 60-65%, GLS -19%. FINDINGS  Left Ventricle: Left ventricular ejection fraction, by estimation, is 60 to 65%. Left ventricular ejection fraction by 3D volume is 62 %. The left ventricle has normal function. The left ventricle has no regional wall motion abnormalities. The average left ventricular global longitudinal strain is -21.3 %. Strain was performed and the global longitudinal strain is normal. The left ventricular internal cavity size was normal in size. There is no left ventricular hypertrophy. Left ventricular diastolic parameters were normal. Right Ventricle: The right ventricular size is normal. No increase in right ventricular wall thickness. Right ventricular systolic function is normal. Left Atrium: Left atrial size was normal in size. Right Atrium: Right atrial size was normal in size. Pericardium: There is no evidence of pericardial effusion. Mitral Valve: The mitral valve is normal in structure. No  evidence of mitral valve regurgitation. No evidence of mitral valve stenosis. Tricuspid Valve: The tricuspid valve is normal in structure. Tricuspid valve regurgitation is not demonstrated. No evidence of tricuspid stenosis. Aortic Valve: The aortic valve is tricuspid. Aortic valve regurgitation is not visualized. No aortic stenosis is present. Pulmonic Valve: The pulmonic valve was normal in structure. Pulmonic valve regurgitation is not visualized. No evidence of pulmonic stenosis. Aorta: The aortic root and ascending aorta are structurally normal, with no evidence of dilitation. Venous: The inferior vena cava is normal in size with greater than  50% respiratory variability, suggesting right atrial pressure of 3 mmHg. IAS/Shunts: There is redundancy of the interatrial septum. The atrial septum is grossly normal. Additional Comments: 3D was performed not requiring image post processing on an independent workstation and was normal.  LEFT VENTRICLE PLAX 2D LVIDd:         4.60 cm         Diastology LVIDs:         2.70 cm         LV e' medial:    11.00 cm/s LV PW:         1.00 cm         LV E/e' medial:  6.6 LV IVS:        0.90 cm         LV e' lateral:   13.10 cm/s LVOT diam:     2.20 cm         LV E/e' lateral: 5.5 LV SV:         95 LV SV Index:   47              2D Longitudinal LVOT Area:     3.80 cm        Strain                                2D Strain GLS   -19.8 %                                (A4C):                                2D Strain GLS   -23.4 %                                (A3C):                                2D Strain GLS   -20.8 %                                (A2C):                                2D Strain GLS   -21.3 %                                Avg:  3D Volume EF                                LV 3D EF:    Left                                             ventricul                                             ar                                             ejection                                             fraction                                             by 3D                                             volume is                                             62 %.                                 3D Volume EF:                                3D EF:        62 %                                LV EDV:       162 ml                                LV ESV:       61 ml                                LV SV:        100 ml RIGHT VENTRICLE RV Basal diam:  2.90 cm RV Mid diam:    2.70 cm RV S prime:     16.90 cm/s TAPSE (M-mode): 2.7 cm LEFT ATRIUM  Index        RIGHT ATRIUM           Index LA diam:         3.30 cm 1.64 cm/m   RA Area:     13.70 cm LA Vol (A2C):   41.7 ml 20.76 ml/m  RA Volume:   33.90 ml  16.87 ml/m LA Vol (A4C):   33.3 ml 16.57 ml/m LA Biplane Vol: 39.2 ml 19.51 ml/m  AORTIC VALVE LVOT Vmax:   122.00 cm/s LVOT Vmean:  81.000 cm/s LVOT VTI:    0.250 m  AORTA Ao Root diam: 2.90 cm Ao Asc diam:  3.00 cm MITRAL VALVE MV Area (PHT): 3.47 cm    SHUNTS MV Decel Time: 219 msec    Systemic VTI:  0.25 m MV E velocity: 72.05 cm/s  Systemic Diam: 2.20 cm MV A velocity: 60.25 cm/s MV E/A ratio:  1.20 Sunit Tolia Electronically signed by Madonna Large Signature Date/Time: 05/09/2024/2:50:53 PM    Final       Orders Placed This Encounter  Procedures   CBC with Differential (Cancer Center Only)    Standing Status:   Future    Expected Date:   05/20/2024    Expiration Date:   05/20/2025   CMP (Cancer Center only)    Standing Status:   Future    Expected Date:   05/20/2024    Expiration Date:   05/20/2025   CBC with Differential (Cancer Center Only)    Standing Status:   Future    Expected Date:   06/03/2024    Expiration Date:   06/03/2025   CMP (Cancer Center only)    Standing Status:   Future    Expected Date:   06/03/2024    Expiration Date:   06/03/2025   All questions were answered. The patient knows to call the clinic with any problems, questions or concerns. No barriers to learning was detected. The total time spent in the appointment was 30 minutes, including review of chart and various tests results, discussions about plan of care and coordination of care plan     Onita Mattock, MD 05/08/2024    "

## 2024-05-12 ENCOUNTER — Inpatient Hospital Stay: Admitting: Family

## 2024-05-12 ENCOUNTER — Inpatient Hospital Stay: Payer: Self-pay

## 2024-05-12 NOTE — Progress Notes (Signed)
 Contacted pt regarding after hours call. Pt was having bone pain and muscle aches over the weekend after tx last week. Pt states she is feeling much better today . Will update Dr Lanny as pt is requesting a muscle relaxer. Pt stated she will call [facility] tomorrow to get her next appointments scheduled.

## 2024-05-13 ENCOUNTER — Encounter: Payer: Self-pay | Admitting: *Deleted

## 2024-05-14 ENCOUNTER — Inpatient Hospital Stay: Admitting: Nurse Practitioner

## 2024-05-14 ENCOUNTER — Inpatient Hospital Stay: Admitting: Family

## 2024-05-14 ENCOUNTER — Inpatient Hospital Stay

## 2024-05-14 ENCOUNTER — Inpatient Hospital Stay: Admitting: Hematology

## 2024-05-14 ENCOUNTER — Other Ambulatory Visit: Payer: Self-pay | Admitting: Hematology

## 2024-05-15 ENCOUNTER — Telehealth: Payer: Self-pay | Admitting: *Deleted

## 2024-05-16 ENCOUNTER — Other Ambulatory Visit: Payer: Self-pay | Admitting: Nurse Practitioner

## 2024-05-16 ENCOUNTER — Telehealth: Payer: Self-pay | Admitting: *Deleted

## 2024-05-16 NOTE — Telephone Encounter (Signed)
 D7794, ICE COMPRESS: RANDOMIZED TRIAL OF LIMB CRYOCOMPRESSION VERSUS CONTINUOUS COMPRESSION VERSUS LOW CYCLIC COMPRESSION FOR THE PREVENTION  OF TAXANE-INDUCED PERIPHERAL NEUROPATHY   Spoke with pt this morning regarding the above clinical study. Pt stated she is interested in the study. She stated she's still having numbness and tingling in her hands and feet and is now having cracks and pains in her hands due to dry skin. Informed pt those symptoms would disqualify her from the study but we can reassess her at her next chemo appt next week. Pt is in agreement for us  to meet her at her chemo appointment on 05/21/24 @ 1:30pm and reassess.   [name] Larsen, RN, BSN Clinical Research Nurse [phone] 05/16/2024

## 2024-05-20 ENCOUNTER — Telehealth: Payer: Self-pay

## 2024-05-20 DIAGNOSIS — C50412 Malignant neoplasm of upper-outer quadrant of left female breast: Secondary | ICD-10-CM

## 2024-05-20 MED FILL — Fosaprepitant Dimeglumine For IV Infusion 150 MG (Base Eq): INTRAVENOUS | Qty: 5 | Status: AC

## 2024-05-20 NOTE — Assessment & Plan Note (Signed)
 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  -Due to her young age and node positive disease, I recommend neoadjuvant chemotherapy AC-T, she started on 04/08/2024.

## 2024-05-20 NOTE — Telephone Encounter (Addendum)
 [phone]: Complementary Options for Symptom Management In Cancer (COSMIC) Assessing Benefits and Harms of Cannabis and Cannabinoid Use Among a Cohort of Cancer Patients Treated in [facility]    Followed up with patient. Patient aware they are not eligible to participate in study. Patient has contact information if they have any further questions.  Laury Quale, MPH  Clinical Research Coordinator

## 2024-05-21 ENCOUNTER — Inpatient Hospital Stay

## 2024-05-21 ENCOUNTER — Inpatient Hospital Stay: Admitting: Hematology

## 2024-05-21 DIAGNOSIS — C50412 Malignant neoplasm of upper-outer quadrant of left female breast: Secondary | ICD-10-CM

## 2024-05-22 ENCOUNTER — Ambulatory Visit: Payer: Self-pay | Admitting: Obstetrics and Gynecology

## 2024-05-22 ENCOUNTER — Other Ambulatory Visit: Payer: Self-pay | Admitting: *Deleted

## 2024-05-23 ENCOUNTER — Other Ambulatory Visit (HOSPITAL_COMMUNITY): Payer: Self-pay

## 2024-05-23 ENCOUNTER — Inpatient Hospital Stay: Admitting: Hematology

## 2024-05-23 ENCOUNTER — Inpatient Hospital Stay: Attending: Family

## 2024-05-23 ENCOUNTER — Encounter: Payer: Self-pay | Admitting: Hematology

## 2024-05-23 ENCOUNTER — Encounter: Payer: Self-pay | Admitting: *Deleted

## 2024-05-23 ENCOUNTER — Inpatient Hospital Stay

## 2024-05-23 VITALS — BP 136/97 | HR 94 | Temp 98.0°F | Resp 17 | Wt 178.9 lb

## 2024-05-23 DIAGNOSIS — C50412 Malignant neoplasm of upper-outer quadrant of left female breast: Secondary | ICD-10-CM

## 2024-05-23 LAB — CMP (CANCER CENTER ONLY)
ALT: 31 U/L (ref 0–44)
AST: 21 U/L (ref 15–41)
Albumin: 4.7 g/dL (ref 3.5–5.0)
Alkaline Phosphatase: 85 U/L (ref 38–126)
Anion gap: 12 (ref 5–15)
BUN: 8 mg/dL (ref 6–20)
CO2: 25 mmol/L (ref 22–32)
Calcium: 9.6 mg/dL (ref 8.9–10.3)
Chloride: 103 mmol/L (ref 98–111)
Creatinine: 0.73 mg/dL (ref 0.44–1.00)
GFR, Estimated: >60 mL/min
Glucose, Bld: 93 mg/dL (ref 70–99)
Potassium: 3.8 mmol/L (ref 3.5–5.1)
Sodium: 139 mmol/L (ref 135–145)
Total Bilirubin: 0.3 mg/dL (ref 0.0–1.2)
Total Protein: 7.4 g/dL (ref 6.5–8.1)

## 2024-05-23 LAB — CBC WITH DIFFERENTIAL (CANCER CENTER ONLY)
Abs Immature Granulocytes: 0.09 10*3/uL — ABNORMAL HIGH (ref 0.00–0.07)
Basophils Absolute: 0.1 10*3/uL (ref 0.0–0.1)
Basophils Relative: 1 %
Eosinophils Absolute: 0.1 10*3/uL (ref 0.0–0.5)
Eosinophils Relative: 1 %
HCT: 36.5 % (ref 36.0–46.0)
Hemoglobin: 12.3 g/dL (ref 12.0–15.0)
Immature Granulocytes: 1 %
Lymphocytes Relative: 20 %
Lymphs Abs: 1.3 10*3/uL (ref 0.7–4.0)
MCH: 28 pg (ref 26.0–34.0)
MCHC: 33.7 g/dL (ref 30.0–36.0)
MCV: 83 fL (ref 80.0–100.0)
Monocytes Absolute: 0.9 10*3/uL (ref 0.1–1.0)
Monocytes Relative: 15 %
Neutro Abs: 3.8 10*3/uL (ref 1.7–7.7)
Neutrophils Relative %: 62 %
Platelet Count: 333 10*3/uL (ref 150–400)
RBC: 4.4 MIL/uL (ref 3.87–5.11)
RDW: 16.7 % — ABNORMAL HIGH (ref 11.5–15.5)
WBC Count: 6.2 10*3/uL (ref 4.0–10.5)
nRBC: 0 % (ref 0.0–0.2)

## 2024-05-23 MED ORDER — BACITRACIN 500 UNIT/GM EX OINT
1.0000 | TOPICAL_OINTMENT | Freq: Two times a day (BID) | CUTANEOUS | 0 refills | Status: AC
  Filled 2024-05-23: qty 28, 16d supply, fill #0

## 2024-05-23 MED ORDER — ONDANSETRON HCL 8 MG PO TABS
8.0000 mg | ORAL_TABLET | Freq: Three times a day (TID) | ORAL | 1 refills | Status: AC | PRN
  Filled 2024-05-23: qty 30, 10d supply, fill #0

## 2024-05-23 MED ORDER — SODIUM CHLORIDE 0.9 % IV SOLN
480.0000 mg/m2 | Freq: Once | INTRAVENOUS | Status: AC
  Administered 2024-05-23: 1000 mg via INTRAVENOUS
  Filled 2024-05-23: qty 50

## 2024-05-23 MED ORDER — PALONOSETRON HCL INJECTION 0.25 MG/5ML
0.2500 mg | Freq: Once | INTRAVENOUS | Status: AC
  Administered 2024-05-23: 0.25 mg via INTRAVENOUS
  Filled 2024-05-23: qty 5

## 2024-05-23 MED ORDER — SODIUM CHLORIDE 0.9 % IV SOLN: INTRAVENOUS | Status: DC

## 2024-05-23 MED ORDER — DEXAMETHASONE SOD PHOSPHATE PF 10 MG/ML IJ SOLN
10.0000 mg | Freq: Once | INTRAMUSCULAR | Status: AC
  Administered 2024-05-23: 10 mg via INTRAVENOUS
  Filled 2024-05-23: qty 1

## 2024-05-23 MED ORDER — SODIUM CHLORIDE 0.9 % IV SOLN: 150.0000 mg | Freq: Once | INTRAVENOUS | Status: DC

## 2024-05-23 MED ORDER — DOXORUBICIN HCL CHEMO IV INJECTION 2 MG/ML
48.0000 mg/m2 | Freq: Once | INTRAVENOUS | Status: AC
  Administered 2024-05-23: 98 mg via INTRAVENOUS
  Filled 2024-05-23: qty 49

## 2024-05-23 MED ORDER — SODIUM CHLORIDE 0.9 % IV SOLN
150.0000 mg | Freq: Once | INTRAVENOUS | Status: AC
  Administered 2024-05-23: 150 mg via INTRAVENOUS
  Filled 2024-05-23: qty 150

## 2024-05-23 MED ORDER — RIZATRIPTAN BENZOATE 5 MG PO TABS
5.0000 mg | ORAL_TABLET | ORAL | 0 refills | Status: AC | PRN
  Filled 2024-05-23: qty 10, 5d supply, fill #0

## 2024-05-23 MED ORDER — GABAPENTIN 300 MG PO CAPS
300.0000 mg | ORAL_CAPSULE | Freq: Three times a day (TID) | ORAL | 2 refills | Status: AC
  Filled 2024-05-23: qty 90, 30d supply, fill #0

## 2024-05-23 NOTE — Research (Signed)
 D7794, ICE COMPRESS: RANDOMIZED TRIAL OF LIMB CRYOCOMPRESSION VERSUS CONTINUOUS COMPRESSION VERSUS LOW CYCLIC COMPRESSION FOR THE PREVENTION  OF TAXANE-INDUCED PERIPHERAL NEUROPATHY   Spoke with pt this afternoon regarding above clinical study. Pt stated that she's still experiencing peripheral neuropathy and Dr.Feng wants her to go ahead an start using ice on her hands today. Informed pt she will not qualify for the study. Pt understood and did not have any questions at this time.   Monica Larsen, RN, BSN Clinical Research Nurse [phone] 05/23/2024

## 2024-05-23 NOTE — Patient Instructions (Signed)
 [facility] - A DEPT OF [facility]  Discharge Instructions: Thank you for choosing [facility] to provide your oncology and hematology care.   If you have a lab appointment with the Cancer Center, please go directly to the Cancer Center and check in at the registration area.   Wear comfortable clothing and clothing appropriate for easy access to any Portacath or PICC line.   We strive to give you quality time with your provider. You may need to reschedule your appointment if you arrive late (15 or more minutes).  Arriving late affects you and other patients whose appointments are after yours.  Also, if you miss three or more appointments without notifying the office, you may be dismissed from the clinic at the provider's discretion.      For prescription refill requests, have your pharmacy contact our office and allow 72 hours for refills to be completed.    Today you received the following chemotherapy and/or immunotherapy agents: Doxorubicin  (Adriamycin ) & Cyclophosphamide  (Cytoxan )     To help prevent nausea and vomiting after your treatment, we encourage you to take your nausea medication as directed.  BELOW ARE SYMPTOMS THAT SHOULD BE REPORTED IMMEDIATELY: *FEVER GREATER THAN 100.4 F (38 C) OR HIGHER *CHILLS OR SWEATING *NAUSEA AND VOMITING THAT IS NOT CONTROLLED WITH YOUR NAUSEA MEDICATION *UNUSUAL SHORTNESS OF BREATH *UNUSUAL BRUISING OR BLEEDING *URINARY PROBLEMS (pain or burning when urinating, or frequent urination) *BOWEL PROBLEMS (unusual diarrhea, constipation, pain near the anus) TENDERNESS IN MOUTH AND THROAT WITH OR WITHOUT PRESENCE OF ULCERS (sore throat, sores in mouth, or a toothache) UNUSUAL RASH, SWELLING OR PAIN  UNUSUAL VAGINAL DISCHARGE OR ITCHING   Items with * indicate a potential emergency and should be followed up as soon as possible or go to the Emergency Department if any problems should occur.  Please show  the CHEMOTHERAPY ALERT CARD or IMMUNOTHERAPY ALERT CARD at check-in to the Emergency Department and triage nurse.  Should you have questions after your visit or need to cancel or reschedule your appointment, please contact [facility] - A DEPT OF [facility]  Dept: [phone]  and follow the prompts.  Office hours are 8:00 a.m. to 4:30 p.m. Monday - Friday. Please note that voicemails left after 4:00 p.m. may not be returned until the following business day.  We are closed weekends and major holidays. You have access to a nurse at all times for urgent questions. Please call the main number to the clinic Dept: [phone] and follow the prompts.   For any non-urgent questions, you may also contact your provider using MyChart. We now offer e-Visits for anyone 110 and older to request care online for non-urgent symptoms. For details visit [email or web address].   Also download the MyChart app! Go to the app store, search MyChart, open the app, select [facility], and log in with your MyChart username and password.

## 2024-05-23 NOTE — Telephone Encounter (Signed)
 Open in error

## 2024-05-23 NOTE — Progress Notes (Signed)
 " [facility]   [phone] [phone]   Clinic Follow up Note   Patient Care Team: [facility], [facility] as PCP - General [name], [name], MD as PCP - Cardiology (Cardiology) Franchot [name], NP as Nurse Practitioner (Nurse Practitioner) Tyree [name], RN as Oncology Nurse Navigator Curvin [name], MD as Consulting Physician (General Surgery) [name], MD as Consulting Physician (Hematology) [name], MD as Consulting Physician (Radiation Oncology)  Date of Service:  05/23/2024  CHIEF COMPLAINT: f/u of left breast cancer  CURRENT THERAPY:  Neoadjuvant chemotherapy Adriamycin  and Cytoxan   Oncology History   Malignant neoplasm of upper-outer quadrant of left breast in female, 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  -Due to her young age and node positive disease, I recommend neoadjuvant chemotherapy AC-T, she started on 04/08/2024.   Assessment & Plan Estrogen receptor positive malignant neoplasm of upper-outer quadrant of left breast She is undergoing neoadjuvant chemotherapy for node-positive, hormone receptor positive breast cancer. Tumor has responded with reduction in palpable mass. Chemotherapy is continued to reduce recurrence risk given young age and nodal involvement. Discussed rationale for chemotherapy versus surgery alone, emphasizing higher recurrence risk without chemotherapy. Taxol dose will be reduced by 20% due to neuropathy, and may be limited to 6 cycles if  toxicity worsens. - Continue neoadjuvant chemotherapy as scheduled: complete 4 cycles of AC, then transition to weekly Taxol (planned 12 cycles, may limit to 6 if neuropathy worsens) - Reduce Taxol dose by 20% due to neuropathy; monitor for further toxicity and consider early cessation if intolerable  Chemotherapy-induced peripheral neuropathy She developed tingling and occasional numbness in hands and feet beginning between second and third cycles of chemotherapy. Symptoms are controlled with gabapentin , but neuropathy is more severe than expected for age and regimen. Taxol is expected to worsen neuropathy, so dose reduction and possible early cessation discussed. Sensation testing shows mild decrease. No prior neuropathy history. Advised on ice bath use during infusions to reduce risk. - Reduce Taxol dose by 20% to mitigate neuropathy progression; consider limiting Taxol to 6 cycles if symptoms worsen - Instructed to use ice baths for hands and feet during chemotherapy infusions - Refilled gabapentin  300 mg tablets; instructed to take up to 600 mg at night if needed - Instructed to report worsening neuropathy for earlier follow-up  Chemotherapy-induced skin toxicity She has significant skin dryness, peeling, and pain on hands and feet, worsening recently. No bleeding or infection. Symptoms attributed to chemotherapy. Over-the-counter and prescription creams trialed with limited benefit. Color changes noted but not concerning. Symptomatic management emphasized. - Recommended cleaning affected areas with soap and water  - Prescribed topical antibiotics (e.g., Neosporin) for painful, cracked areas - Ordered prescription topical cream for rash - Provided guidance on over-the-counter and prescription options  Chemotherapy-induced oral mucositis Ongoing oral mucositis managed with compounded mouthwash.  - Confirmed ongoing use of magic mouthwash; instructed to refill as needed  Migraine She has  intermittent migraine-type headaches since starting chemotherapy, not controlled with acetaminophen  or amitriptyline  (latter only tolerated at night). Rizatriptan  previously  effective for breakthrough episodes.  - Prescribed rizatriptan  5-10 mg for breakthrough migraine attacks, not for daily use; instructed to start with 5 mg and repeat if needed up to 10 mg per episode - Continue amitriptyline  at night as tolerated - Monitor for medication side effects and efficacy   Probable Lipoma, left trunk New palpable mass on left trunk, confirmed by CT as lipoma.  - Reviewed CT findings with her - Provided reassurance regarding benign nature of lipoma - No intervention required unless symptomatic or changes occur  Plan - Patient has multiple complaints, I discussed the management with her and her female friend in great detail. - Will increase Neurontin  to 300 mg 3 times daily for her neuropathy -I also called in rizatriptan  for her migraine headaches - Echocardiogram reviewed, normal - Will decrease last cycle chemo dose by 20% due to her neuropathy - She will return in 2 weeks to start weekly paclitaxel, will also reduce paclitaxel dose due to her neuropathy   SUMMARY OF ONCOLOGIC HISTORY: Oncology History  Malignant neoplasm of upper-outer quadrant of left breast in female, estrogen receptor positive (HCC)  03/19/2024 Cancer Staging   Staging form: Breast, AJCC 8th Edition - Clinical stage from 03/19/2024: Stage IIA (cT2, cN1, cM0, G2, ER+, PR+, HER2-) - Signed by [name], MD on 03/26/2024 Stage prefix: Initial diagnosis Histologic grading system: 3 grade system   03/24/2024 Initial Diagnosis   Malignant neoplasm of upper-outer quadrant of left breast in female, estrogen receptor positive (HCC)   04/08/2024 -  Chemotherapy   Patient is on Treatment Plan : BREAST DOSE DENSE AC q14d / PACLitaxel q7d     04/08/2024 Genetic Testing   Negative genetic testing. AXIN2  p.S796G (c.2386A>G)   and SMAD4 p.I228V (c.682A>G)  VUS The report date is April 08, 2024.  The CancerNext-Expanded gene panel offered by [facility] and includes sequencing, rearrangement, and RNA analysis for the following 77 genes: AIP, ALK, APC, ATM, BAP1, BARD1, BMPR1A, BRCA1, BRCA2, BRIP1, CDC73, CDH1, CDK4, CDKN1B, CDKN2A, CEBPA, CHEK2, CTNNA1, DDX41, DICER1, ETV6, FH, FLCN, GATA2, LZTR1, MAX, MBD4, MEN1, MET, MLH1, MSH2, MSH3, MSH6, MUTYH, NF1, NF2, NTHL1, PALB2, PHOX2B, PMS2, POT1, PRKAR1A, PTCH1, PTEN, RAD51C, RAD51D, RB1, RET, RPS20, RUNX1, SDHA, SDHAF2, SDHB, SDHC, SDHD, SMAD4, SMARCA4, SMARCB1, SMARCE1, STK11, SUFU, TMEM127, TP53, TSC1, TSC2, VHL, and WT1 (sequencing and deletion/duplication); AXIN2, CTNNA1, DDX41, EGFR, HOXB13, KIT, MBD4, MITF, MSH3, PDGFRA, POLD1 and POLE (sequencing only); EPCAM and GREM1 (deletion/duplication only). RNA data is routinely analyzed for use in variant interpretation for all genes.       Discussed the use of AI scribe software for clinical note transcription with the patient, who gave verbal consent to proceed.  History of Present Illness Monica Holland is a 39 year old female with estrogen receptor positive invasive ductal carcinoma of the left breast who presents for follow-up to assess response to neoadjuvant chemotherapy and manage treatment-related toxicities.  She is on cycle 4 of neoadjuvant Adriamycin  and Cyclophosphamide  for left breast cancer with prior axillary node involvement. She notes marked reduction in the primary breast mass, with one lesion now a small knot and the other no longer palpable. She identifies a new small, sometimes tender, protruding area on the left trunk. She denies abnormal bleeding, bruising, fever, cough, or shortness of breath. Recent echocardiogram was normal.  She developed chemotherapy-induced peripheral neuropathy between cycles 2 and 3, with tingling and intermittent numbness in feet and hands. Symptoms are mild but persistent  and controlled with gabapentin  300 mg  three times daily with occasional extra nighttime dose. She uses ice during infusions but not ice baths. She is worried about neuropathy worsening with the upcoming Taxol phase and discussed possible dose reduction or early discontinuation if neuropathy progresses.  She has worsening skin dryness, peeling, and pain of feet and hands with limited relief from frequent lotion and multiple OTC and prescription creams. Some products cause discoloration. She requests prescription topical treatments for symptom control.  She has persistent clear rhinorrhea and mild congestion since the last chemotherapy cycle without fever, cough, or other infectious symptoms at home. She attributes this to recent exposure and not wearing a mask.  She has intermittent severe migraine-type headaches since starting chemotherapy that require resting in a dark room. Amitriptyline  at night helps but is too sedating for daytime. Tylenol  does not help. She previously responded well to rizatriptan  10 mg and requests this for breakthrough treatment. Headaches are not daily.  She uses Zofran  for chemotherapy-related nausea and requests a refill. She uses a compounded mouthwash for oral mucositis and has no new oral or esophageal symptoms.  May 08, 2024: Follow-up for ER+/PR+/HER2- invasive ductal carcinoma of left breast, stage IIA (cT2N1M0, G2); currently on third cycle of neoadjuvant AC chemotherapy with clinical improvement in breast mass. Chemotherapy-induced mucositis and esophagitis managed with compounded magic mouthwash and sucralfate ; ongoing cardiac surveillance with recent echocardiogram showing normal LV function. Planned to complete fourth cycle of AC before transitioning to Taxol; supportive care reviewed and coordinated, including management of oral, dental, and skin symptoms.     All other systems were reviewed with the patient and are negative.  MEDICAL HISTORY:  Past Medical  History:  Diagnosis Date   Abnormal uterine bleeding (AUB)    heavy vaginal bleeding   Anticoagulant long-term use    eliquis --- managed by dr timmy (hematologist)   Anxiety    Arthritis    Breast CA (HCC) 03/19/2024   left   Cancer (HCC)    Chronic nausea    per pt intermittant , without vomiting,  had normal EGD 12-29-2021 by dr federico   Chronic venous insufficiency of lower extremity    evaluted by dr debby robertson (vascular) 03-15-2021 note in epic, chronic venous insuff LLE causing edema/ post thombotic syndrome   Depression    Family history of breast cancer    GERD (gastroesophageal reflux disease)    History of pulmonary embolus (PE) 2014   nonocclusive and RLE DVT   History of recurrent deep vein thrombosis (DVT)    LLE in 2004, 2009, 2016, 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   Peripheral vascular disease    Wears glasses     SURGICAL HISTORY: Past Surgical History:  Procedure Laterality Date   ANKLE SURGERY Right 2014   ARTHROSCOPY KNEE W/ DRILLING Left 10/2023   BREAST BIOPSY Left 03/19/2024   US  LT BREAST BX W LOC DEV EA ADD LESION IMG BX SPEC US  GUIDE 03/19/2024 [facility]   BREAST BIOPSY Left 03/19/2024   US  LT BREAST BX W LOC DEV 1ST LESION IMG BX SPEC US  GUIDE 03/19/2024 [facility]   BREAST BIOPSY Left 03/19/2024   US  LT BREAST BX W LOC DEV EA ADD LESION IMG BX SPEC US  GUIDE 03/19/2024 [facility]   BREAST BIOPSY Right 04/14/2024  US  RT BREAST BX W LOC DEV 1ST LESION IMG BX SPEC US  GUIDE 04/14/2024 [facility]   DILITATION & CURRETTAGE/HYSTROSCOPY WITH NOVASURE ABLATION N/A 01/04/2022   Procedure: DILATATION & CURETTAGE/HYSTEROSCOPY WITH NOVASURE ABLATION;  Surgeon: Cleatus Moccasin, MD;  Location: [facility];  Service: Gynecology;  Laterality: N/A;    ESOPHAGOGASTRODUODENOSCOPY (EGD) WITH PROPOFOL   12/29/2021   by dr federico   INTRAUTERINE DEVICE (IUD) INSERTION N/A 01/04/2022   Procedure: INTRAUTERINE DEVICE (IUD) INSERTION;  Surgeon: Cleatus Moccasin, MD;  Location: [facility];  Service: Gynecology;  Laterality: N/A;   PORTACATH PLACEMENT N/A 03/31/2024   Procedure: INSERTION, TUNNELED CENTRAL VENOUS DEVICE, WITH PORT;  Surgeon: Curvin [name], MD;  Location: [facility];  Service: General;  Laterality: N/A;  PORT PLACEMENT WITH ULTRASOUND GUIDANCE   ROTATOR CUFF REPAIR Left 12/2023   SHOULDER ARTHROSCOPY Right 2024    I have reviewed the social history and family history with the patient and they are unchanged from previous note.  ALLERGIES:  is allergic to nsaids.  MEDICATIONS:  Current Outpatient Medications  Medication Sig Dispense Refill   acetaminophen  (TYLENOL ) 325 MG tablet Take 2 tablets (650 mg total) by mouth every 6 (six) hours as needed for mild pain (pain score 1-3), fever or headache.     albuterol  (VENTOLIN  HFA) 108 (90 Base) MCG/ACT inhaler Inhale 1-2 puffs into the lungs every 6 (six) hours as needed for wheezing or shortness of breath. 8 g 2   amitriptyline  (ELAVIL ) 50 MG tablet TAKE 1 TABLET BY MOUTH EVERYDAY AT BEDTIME 90 tablet 1   bacitracin  500 UNIT/GM ointment Apply 1 Application topically 2 (two) times daily. To skin cracks 28 g 0   dabigatran  (PRADAXA ) 150 MG CAPS capsule Take 1 capsule (150 mg total) by mouth 2 (two) times daily. 60 capsule 2   enoxaparin  (LOVENOX ) 100 MG/ML injection Inject 0.85 mLs (85 mg total) into the skin every 12 (twelve) hours. 2.55 mL 0   famotidine  (PEPCID ) 20 MG tablet Take 20 mg by mouth 2 (two) times daily.     fluticasone  (FLONASE ) 50 MCG/ACT nasal spray Place 2 sprays into both nostrils daily. 9.9 mL 2   gabapentin  (NEURONTIN ) 300 MG capsule Take 1 capsule (300 mg total) by mouth 3 (three) times daily. 90 capsule 2   loratadine  (CLARITIN ) 10 MG tablet Take 10 mg by  mouth every morning.     magic mouthwash (nystatin , lidocaine , diphenhydrAMINE, alum & mag hydroxide) suspension Swish and swallow 5 mLs by mouth 4 (four) times daily as needed for mouth pain. 140 mL 1   oxyCODONE  (ROXICODONE ) 5 MG immediate release tablet Take 1 tablet (5 mg total) by mouth every 6 (six) hours as needed. 30 tablet 0   pantoprazole  (PROTONIX ) 40 MG tablet TAKE 1 TABLET BY MOUTH TWICE A DAY 180 tablet 1   polyethylene glycol (MIRALAX  / GLYCOLAX ) 17 g packet Take 17 g by mouth daily. 14 each 0   rizatriptan  (MAXALT ) 5 MG tablet Take 1 tablet (5 mg total) by mouth as needed for migraine. May repeat in 2 hours if needed 10 tablet 0   sucralfate  (CARAFATE ) 1 g tablet Take 1 tablet (1 g total) by mouth 4 (four) times daily. 60 tablet 1   ondansetron  (ZOFRAN ) 8 MG tablet Take 1 tablet (8 mg total) by mouth every 8 (eight) hours as needed for nausea/vomiting. Start third day after doxorubicin /cyclophosphamide  chemotherapy. 30 tablet 1   senna (SENOKOT) 8.6 MG TABS tablet Take 2 tablets (  17.2 mg total) by mouth at bedtime. (Patient not taking: Reported on 05/23/2024) 120 tablet 0   No current facility-administered medications for this visit.   Facility-Administered Medications Ordered in Other Visits  Medication Dose Route Frequency Provider Last Rate Last Admin   0.9 %  sodium chloride  infusion   Intravenous Continuous [name], MD 10 mL/hr at 05/23/24 1431 New Bag at 05/23/24 1431   cyclophosphamide  (CYTOXAN ) 1,000 mg in sodium chloride  0.9 % 250 mL chemo infusion  480 mg/m2 (Treatment Plan Recorded) Intravenous Once [name], MD       DOXOrubicin  (ADRIAMYCIN ) chemo injection 98 mg  48 mg/m2 (Treatment Plan Recorded) Intravenous Once [name], MD       fosaprepitant  (EMEND) 150 mg in sodium chloride  0.9 % 145 mL IVPB  150 mg Intravenous Once [name], MD        PHYSICAL EXAMINATION: ECOG PERFORMANCE STATUS: 1 - Symptomatic but completely ambulatory  Vitals:   05/23/24 1156  05/23/24 1241  BP: (!) 136/97   Pulse:    Resp:    Temp:  98 F (36.7 C)  SpO2:     Wt Readings from Last 3 Encounters:  05/23/24 178 lb 14.4 oz (81.1 kg)  05/08/24 179 lb 1.6 oz (81.2 kg)  05/06/24 182 lb (82.6 kg)     GENERAL:alert, no distress and comfortable SKIN: skin color, texture, turgor are normal, (+) skin hyperpigmentation and a few cracks in her hands EYES: normal, Conjunctiva are pink and non-injected, sclera clear NECK: supple, thyroid normal size, non-tender, without nodularity LYMPH:  no palpable lymphadenopathy in the cervical, axillary  LUNGS: clear to auscultation and percussion with normal breathing effort HEART: regular rate & rhythm and no murmurs and no lower extremity edema ABDOMEN:abdomen soft, non-tender and normal bowel sounds Musculoskeletal:no cyanosis of digits and no clubbing  NEURO: alert & oriented x 3 with fluent speech, no focal motor/sensory deficits   Physical Exam   LABORATORY DATA:  I have reviewed the data as listed    Latest Ref Rng & Units 05/23/2024   12:45 PM 05/08/2024   12:26 PM 04/22/2024   12:56 PM  CBC  WBC 4.0 - 10.5 K/uL 6.2  4.5  7.6   Hemoglobin 12.0 - 15.0 g/dL 87.6  87.1  87.5   Hematocrit 36.0 - 46.0 % 36.5  37.3  36.9   Platelets 150 - 400 K/uL 333  249  228         Latest Ref Rng & Units 05/23/2024   12:45 PM 05/08/2024   12:26 PM 04/22/2024   12:56 PM  CMP  Glucose 70 - 99 mg/dL 93  885  888   BUN 6 - 20 mg/dL 8  10  11    Creatinine 0.44 - 1.00 mg/dL 9.26  9.08  9.18   Sodium 135 - 145 mmol/L 139  143  138   Potassium 3.5 - 5.1 mmol/L 3.8  4.2  3.7   Chloride 98 - 111 mmol/L 103  106  106   CO2 22 - 32 mmol/L 25  29  24    Calcium 8.9 - 10.3 mg/dL 9.6  9.3  9.4   Total Protein 6.5 - 8.1 g/dL 7.4  7.0  7.0   Total Bilirubin 0.0 - 1.2 mg/dL 0.3  0.3  <9.7   Alkaline Phos 38 - 126 U/L 85  80  75   AST 15 - 41 U/L 21  23  29    ALT 0 - 44 U/L 31  24  68       RADIOGRAPHIC STUDIES: I have personally reviewed  the radiological images as listed and agreed with the findings in the report. No results found.    No orders of the defined types were placed in this encounter.  All questions were answered. The patient knows to call the clinic with any problems, questions or concerns. No barriers to learning was detected. The total time spent in the appointment was 40 minutes, including review of chart and various tests results, discussions about plan of care and coordination of care plan     Onita Mattock, MD 05/23/2024     "

## 2024-05-23 NOTE — Assessment & Plan Note (Signed)
 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  -Due to her young age and node positive disease, I recommend neoadjuvant chemotherapy AC-T, she started on 04/08/2024.

## 2024-05-24 ENCOUNTER — Inpatient Hospital Stay

## 2024-06-04 ENCOUNTER — Inpatient Hospital Stay

## 2024-06-04 ENCOUNTER — Inpatient Hospital Stay: Admitting: Hematology

## 2024-06-10 ENCOUNTER — Inpatient Hospital Stay

## 2024-06-17 ENCOUNTER — Inpatient Hospital Stay

## 2024-06-17 ENCOUNTER — Inpatient Hospital Stay: Attending: Family | Admitting: Hematology

## 2024-06-24 ENCOUNTER — Inpatient Hospital Stay

## 2024-07-01 ENCOUNTER — Inpatient Hospital Stay

## 2024-07-01 ENCOUNTER — Inpatient Hospital Stay: Admitting: Hematology

## 2024-07-08 ENCOUNTER — Inpatient Hospital Stay

## 2024-07-23 ENCOUNTER — Ambulatory Visit: Admitting: Family

## 2024-07-23 ENCOUNTER — Other Ambulatory Visit

## 2024-08-11 ENCOUNTER — Inpatient Hospital Stay

## 2024-08-11 ENCOUNTER — Inpatient Hospital Stay: Admitting: Family
